# Patient Record
Sex: Female | Born: 1970 | Race: Black or African American | Hispanic: No | Smoking: Never smoker
Health system: Southern US, Community
[De-identification: ages and names within clinical notes are randomized; demographics above are authoritative.]

## PROBLEM LIST (undated history)

## (undated) DIAGNOSIS — K219 Gastro-esophageal reflux disease without esophagitis: Secondary | ICD-10-CM

## (undated) DIAGNOSIS — D649 Anemia, unspecified: Secondary | ICD-10-CM

## (undated) DIAGNOSIS — E785 Hyperlipidemia, unspecified: Secondary | ICD-10-CM

## (undated) DIAGNOSIS — R0602 Shortness of breath: Secondary | ICD-10-CM

## (undated) DIAGNOSIS — G4730 Sleep apnea, unspecified: Secondary | ICD-10-CM

## (undated) DIAGNOSIS — I10 Essential (primary) hypertension: Secondary | ICD-10-CM

## (undated) DIAGNOSIS — F419 Anxiety disorder, unspecified: Secondary | ICD-10-CM

## (undated) DIAGNOSIS — E669 Obesity, unspecified: Secondary | ICD-10-CM

## (undated) DIAGNOSIS — F329 Major depressive disorder, single episode, unspecified: Secondary | ICD-10-CM

## (undated) DIAGNOSIS — E538 Deficiency of other specified B group vitamins: Secondary | ICD-10-CM

## (undated) HISTORY — DX: Anemia, unspecified: D64.9

## (undated) HISTORY — DX: Obesity, unspecified: E66.9

## (undated) HISTORY — PX: COLONOSCOPY: SHX174

## (undated) HISTORY — DX: Major depressive disorder, single episode, unspecified: F32.9

## (undated) HISTORY — PX: TONSILLECTOMY AND ADENOIDECTOMY: SUR1326

## (undated) HISTORY — DX: Deficiency of other specified B group vitamins: E53.8

## (undated) HISTORY — PX: UPPER GI ENDOSCOPY: SHX6162

## (undated) HISTORY — DX: Essential (primary) hypertension: I10

## (undated) HISTORY — DX: Gastro-esophageal reflux disease without esophagitis: K21.9

## (undated) HISTORY — DX: Sleep apnea, unspecified: G47.30

## (undated) HISTORY — PX: WISDOM TOOTH EXTRACTION: SHX21

---

## 1998-04-13 ENCOUNTER — Other Ambulatory Visit: Admission: RE | Admit: 1998-04-13 | Discharge: 1998-04-13 | Payer: Self-pay | Admitting: Obstetrics and Gynecology

## 1999-05-06 ENCOUNTER — Other Ambulatory Visit: Admission: RE | Admit: 1999-05-06 | Discharge: 1999-05-06 | Payer: Self-pay | Admitting: Obstetrics and Gynecology

## 2000-06-14 ENCOUNTER — Other Ambulatory Visit: Admission: RE | Admit: 2000-06-14 | Discharge: 2000-06-14 | Payer: Self-pay | Admitting: Obstetrics and Gynecology

## 2000-08-01 HISTORY — PX: OTHER SURGICAL HISTORY: SHX169

## 2000-08-01 HISTORY — PX: MYOMECTOMY: SHX85

## 2000-10-23 ENCOUNTER — Ambulatory Visit (HOSPITAL_BASED_OUTPATIENT_CLINIC_OR_DEPARTMENT_OTHER): Admission: RE | Admit: 2000-10-23 | Discharge: 2000-10-23 | Payer: Self-pay | Admitting: Critical Care Medicine

## 2001-01-04 ENCOUNTER — Ambulatory Visit (HOSPITAL_BASED_OUTPATIENT_CLINIC_OR_DEPARTMENT_OTHER): Admission: RE | Admit: 2001-01-04 | Discharge: 2001-01-04 | Payer: Self-pay | Admitting: Critical Care Medicine

## 2001-03-01 ENCOUNTER — Other Ambulatory Visit: Admission: RE | Admit: 2001-03-01 | Discharge: 2001-03-01 | Payer: Self-pay | Admitting: Obstetrics and Gynecology

## 2001-03-05 ENCOUNTER — Inpatient Hospital Stay (HOSPITAL_COMMUNITY): Admission: AD | Admit: 2001-03-05 | Discharge: 2001-03-05 | Payer: Self-pay | Admitting: Obstetrics and Gynecology

## 2001-03-05 ENCOUNTER — Encounter: Payer: Self-pay | Admitting: Obstetrics and Gynecology

## 2001-03-07 ENCOUNTER — Inpatient Hospital Stay (HOSPITAL_COMMUNITY): Admission: AD | Admit: 2001-03-07 | Discharge: 2001-03-07 | Payer: Self-pay | Admitting: Obstetrics and Gynecology

## 2001-03-08 ENCOUNTER — Encounter (INDEPENDENT_AMBULATORY_CARE_PROVIDER_SITE_OTHER): Payer: Self-pay

## 2001-03-08 ENCOUNTER — Ambulatory Visit (HOSPITAL_COMMUNITY): Admission: AD | Admit: 2001-03-08 | Discharge: 2001-03-08 | Payer: Self-pay | Admitting: Obstetrics and Gynecology

## 2001-09-17 ENCOUNTER — Other Ambulatory Visit: Admission: RE | Admit: 2001-09-17 | Discharge: 2001-09-17 | Payer: Self-pay | Admitting: Obstetrics and Gynecology

## 2002-06-10 ENCOUNTER — Other Ambulatory Visit: Admission: RE | Admit: 2002-06-10 | Discharge: 2002-06-10 | Payer: Self-pay | Admitting: Obstetrics and Gynecology

## 2002-07-23 ENCOUNTER — Encounter: Admission: RE | Admit: 2002-07-23 | Discharge: 2002-10-21 | Payer: Self-pay | Admitting: Obstetrics and Gynecology

## 2002-08-01 DIAGNOSIS — K219 Gastro-esophageal reflux disease without esophagitis: Secondary | ICD-10-CM

## 2002-08-01 HISTORY — DX: Gastro-esophageal reflux disease without esophagitis: K21.9

## 2002-11-18 ENCOUNTER — Inpatient Hospital Stay (HOSPITAL_COMMUNITY): Admission: AD | Admit: 2002-11-18 | Discharge: 2002-11-18 | Payer: Self-pay | Admitting: Obstetrics and Gynecology

## 2002-11-18 ENCOUNTER — Encounter: Payer: Self-pay | Admitting: Obstetrics and Gynecology

## 2002-11-26 ENCOUNTER — Encounter (HOSPITAL_COMMUNITY): Admission: RE | Admit: 2002-11-26 | Discharge: 2002-12-26 | Payer: Self-pay | Admitting: Obstetrics and Gynecology

## 2002-12-23 ENCOUNTER — Inpatient Hospital Stay (HOSPITAL_COMMUNITY): Admission: AD | Admit: 2002-12-23 | Discharge: 2002-12-23 | Payer: Self-pay | Admitting: Obstetrics and Gynecology

## 2003-01-24 ENCOUNTER — Inpatient Hospital Stay (HOSPITAL_COMMUNITY): Admission: AD | Admit: 2003-01-24 | Discharge: 2003-01-28 | Payer: Self-pay | Admitting: Obstetrics and Gynecology

## 2003-01-24 ENCOUNTER — Encounter (INDEPENDENT_AMBULATORY_CARE_PROVIDER_SITE_OTHER): Payer: Self-pay | Admitting: *Deleted

## 2003-02-28 ENCOUNTER — Other Ambulatory Visit: Admission: RE | Admit: 2003-02-28 | Discharge: 2003-02-28 | Payer: Self-pay | Admitting: Obstetrics and Gynecology

## 2003-10-09 ENCOUNTER — Emergency Department (HOSPITAL_COMMUNITY): Admission: AD | Admit: 2003-10-09 | Discharge: 2003-10-09 | Payer: Self-pay | Admitting: Family Medicine

## 2004-03-02 ENCOUNTER — Other Ambulatory Visit: Admission: RE | Admit: 2004-03-02 | Discharge: 2004-03-02 | Payer: Self-pay | Admitting: Obstetrics and Gynecology

## 2004-04-06 ENCOUNTER — Ambulatory Visit (HOSPITAL_COMMUNITY): Admission: RE | Admit: 2004-04-06 | Discharge: 2004-04-06 | Payer: Self-pay | Admitting: Obstetrics and Gynecology

## 2004-06-09 ENCOUNTER — Ambulatory Visit: Payer: Self-pay | Admitting: Internal Medicine

## 2004-06-21 ENCOUNTER — Ambulatory Visit: Payer: Self-pay | Admitting: Internal Medicine

## 2004-07-12 ENCOUNTER — Ambulatory Visit: Payer: Self-pay | Admitting: Internal Medicine

## 2004-09-13 ENCOUNTER — Ambulatory Visit: Payer: Self-pay | Admitting: Internal Medicine

## 2004-12-03 ENCOUNTER — Ambulatory Visit: Payer: Self-pay | Admitting: Internal Medicine

## 2004-12-20 ENCOUNTER — Emergency Department (HOSPITAL_COMMUNITY): Admission: EM | Admit: 2004-12-20 | Discharge: 2004-12-20 | Payer: Self-pay | Admitting: Emergency Medicine

## 2004-12-31 ENCOUNTER — Ambulatory Visit: Payer: Self-pay | Admitting: Internal Medicine

## 2005-03-03 ENCOUNTER — Other Ambulatory Visit: Admission: RE | Admit: 2005-03-03 | Discharge: 2005-03-03 | Payer: Self-pay | Admitting: Obstetrics and Gynecology

## 2005-05-26 ENCOUNTER — Ambulatory Visit: Payer: Self-pay | Admitting: Internal Medicine

## 2005-10-07 ENCOUNTER — Emergency Department (HOSPITAL_COMMUNITY): Admission: EM | Admit: 2005-10-07 | Discharge: 2005-10-07 | Payer: Self-pay | Admitting: Family Medicine

## 2005-10-09 ENCOUNTER — Emergency Department (HOSPITAL_COMMUNITY): Admission: EM | Admit: 2005-10-09 | Discharge: 2005-10-09 | Payer: Self-pay | Admitting: Family Medicine

## 2005-10-11 ENCOUNTER — Emergency Department (HOSPITAL_COMMUNITY): Admission: EM | Admit: 2005-10-11 | Discharge: 2005-10-11 | Payer: Self-pay | Admitting: Family Medicine

## 2005-10-13 ENCOUNTER — Ambulatory Visit: Payer: Self-pay | Admitting: Internal Medicine

## 2005-10-17 ENCOUNTER — Ambulatory Visit: Payer: Self-pay | Admitting: Internal Medicine

## 2005-11-02 ENCOUNTER — Ambulatory Visit: Payer: Self-pay | Admitting: Internal Medicine

## 2005-11-08 ENCOUNTER — Ambulatory Visit: Payer: Self-pay | Admitting: Internal Medicine

## 2005-11-16 ENCOUNTER — Encounter: Admission: RE | Admit: 2005-11-16 | Discharge: 2005-11-16 | Payer: Self-pay | Admitting: Internal Medicine

## 2005-11-17 ENCOUNTER — Ambulatory Visit: Payer: Self-pay | Admitting: Endocrinology

## 2005-12-08 ENCOUNTER — Ambulatory Visit: Payer: Self-pay | Admitting: Internal Medicine

## 2006-03-23 ENCOUNTER — Other Ambulatory Visit: Admission: RE | Admit: 2006-03-23 | Discharge: 2006-03-23 | Payer: Self-pay | Admitting: Obstetrics and Gynecology

## 2006-05-06 ENCOUNTER — Emergency Department (HOSPITAL_COMMUNITY): Admission: EM | Admit: 2006-05-06 | Discharge: 2006-05-06 | Payer: Self-pay | Admitting: Family Medicine

## 2006-05-10 ENCOUNTER — Ambulatory Visit: Payer: Self-pay | Admitting: Internal Medicine

## 2006-08-01 DIAGNOSIS — I10 Essential (primary) hypertension: Secondary | ICD-10-CM

## 2006-08-01 HISTORY — DX: Essential (primary) hypertension: I10

## 2006-08-10 ENCOUNTER — Emergency Department (HOSPITAL_COMMUNITY): Admission: EM | Admit: 2006-08-10 | Discharge: 2006-08-10 | Payer: Self-pay | Admitting: Family Medicine

## 2006-10-17 ENCOUNTER — Ambulatory Visit: Payer: Self-pay | Admitting: Internal Medicine

## 2006-10-30 ENCOUNTER — Encounter: Admission: RE | Admit: 2006-10-30 | Discharge: 2006-10-30 | Payer: Self-pay | Admitting: Internal Medicine

## 2006-11-08 ENCOUNTER — Ambulatory Visit: Payer: Self-pay | Admitting: Gastroenterology

## 2006-11-27 ENCOUNTER — Ambulatory Visit (HOSPITAL_COMMUNITY): Admission: RE | Admit: 2006-11-27 | Discharge: 2006-11-27 | Payer: Self-pay | Admitting: Gastroenterology

## 2006-11-30 ENCOUNTER — Ambulatory Visit: Payer: Self-pay | Admitting: Gastroenterology

## 2006-12-05 ENCOUNTER — Ambulatory Visit: Payer: Self-pay | Admitting: Pulmonary Disease

## 2006-12-12 ENCOUNTER — Ambulatory Visit: Payer: Self-pay | Admitting: Endocrinology

## 2006-12-19 ENCOUNTER — Encounter (INDEPENDENT_AMBULATORY_CARE_PROVIDER_SITE_OTHER): Payer: Self-pay | Admitting: Obstetrics and Gynecology

## 2006-12-19 ENCOUNTER — Ambulatory Visit (HOSPITAL_COMMUNITY): Admission: RE | Admit: 2006-12-19 | Discharge: 2006-12-19 | Payer: Self-pay | Admitting: Obstetrics and Gynecology

## 2006-12-27 ENCOUNTER — Ambulatory Visit: Payer: Self-pay | Admitting: Gastroenterology

## 2006-12-29 ENCOUNTER — Ambulatory Visit (HOSPITAL_COMMUNITY): Admission: RE | Admit: 2006-12-29 | Discharge: 2006-12-29 | Payer: Self-pay | Admitting: Gastroenterology

## 2007-01-22 ENCOUNTER — Ambulatory Visit: Payer: Self-pay | Admitting: Internal Medicine

## 2007-01-22 LAB — CONVERTED CEMR LAB: Streptococcus, Group A Screen (Direct): NEGATIVE

## 2007-03-26 ENCOUNTER — Other Ambulatory Visit: Admission: RE | Admit: 2007-03-26 | Discharge: 2007-03-26 | Payer: Self-pay | Admitting: Obstetrics and Gynecology

## 2007-03-29 ENCOUNTER — Ambulatory Visit: Payer: Self-pay | Admitting: Internal Medicine

## 2007-03-29 LAB — CONVERTED CEMR LAB
AST: 16 units/L (ref 0–37)
Albumin: 3.3 g/dL — ABNORMAL LOW (ref 3.5–5.2)
Alkaline Phosphatase: 82 units/L (ref 39–117)
BUN: 10 mg/dL (ref 6–23)
Bilirubin, Direct: 0.1 mg/dL (ref 0.0–0.3)
CO2: 27 meq/L (ref 19–32)
Eosinophils Absolute: 0.2 10*3/uL (ref 0.0–0.6)
GFR calc Af Amer: 105 mL/min
GFR calc non Af Amer: 87 mL/min
Glucose, Bld: 120 mg/dL — ABNORMAL HIGH (ref 70–99)
HCT: 30.9 % — ABNORMAL LOW (ref 36.0–46.0)
Hemoglobin: 10.3 g/dL — ABNORMAL LOW (ref 12.0–15.0)
LDL Cholesterol: 108 mg/dL — ABNORMAL HIGH (ref 0–99)
MCHC: 33.5 g/dL (ref 30.0–36.0)
Monocytes Absolute: 0.6 10*3/uL (ref 0.2–0.7)
RBC: 4.17 M/uL (ref 3.87–5.11)
Sodium: 142 meq/L (ref 135–145)
Specific Gravity, Urine: 1.025 (ref 1.000–1.03)
Total Bilirubin: 0.4 mg/dL (ref 0.3–1.2)
Total CHOL/HDL Ratio: 4.5
VLDL: 32 mg/dL (ref 0–40)
WBC: 7.8 10*3/uL (ref 4.5–10.5)
pH: 6 (ref 5.0–8.0)

## 2007-04-05 ENCOUNTER — Encounter: Admission: RE | Admit: 2007-04-05 | Discharge: 2007-04-05 | Payer: Self-pay | Admitting: Internal Medicine

## 2007-05-28 ENCOUNTER — Encounter: Payer: Self-pay | Admitting: Internal Medicine

## 2007-05-28 DIAGNOSIS — D509 Iron deficiency anemia, unspecified: Secondary | ICD-10-CM

## 2007-08-30 ENCOUNTER — Ambulatory Visit: Payer: Self-pay | Admitting: Internal Medicine

## 2007-08-30 DIAGNOSIS — R209 Unspecified disturbances of skin sensation: Secondary | ICD-10-CM | POA: Insufficient documentation

## 2007-08-30 DIAGNOSIS — E559 Vitamin D deficiency, unspecified: Secondary | ICD-10-CM | POA: Insufficient documentation

## 2007-08-30 DIAGNOSIS — R635 Abnormal weight gain: Secondary | ICD-10-CM

## 2007-08-30 DIAGNOSIS — L659 Nonscarring hair loss, unspecified: Secondary | ICD-10-CM | POA: Insufficient documentation

## 2007-08-30 DIAGNOSIS — E538 Deficiency of other specified B group vitamins: Secondary | ICD-10-CM | POA: Insufficient documentation

## 2007-09-05 ENCOUNTER — Emergency Department (HOSPITAL_COMMUNITY): Admission: EM | Admit: 2007-09-05 | Discharge: 2007-09-05 | Payer: Self-pay | Admitting: Emergency Medicine

## 2007-09-10 LAB — CONVERTED CEMR LAB
AST: 13 units/L (ref 0–37)
Alkaline Phosphatase: 84 units/L (ref 39–117)
BUN: 10 mg/dL (ref 6–23)
Basophils Absolute: 0 10*3/uL (ref 0.0–0.1)
Bilirubin, Direct: 0.1 mg/dL (ref 0.0–0.3)
Calcium: 9 mg/dL (ref 8.4–10.5)
Chloride: 105 meq/L (ref 96–112)
GFR calc Af Amer: 122 mL/min
GFR calc non Af Amer: 101 mL/min
Glucose, Bld: 110 mg/dL — ABNORMAL HIGH (ref 70–99)
Hemoglobin, Urine: NEGATIVE
Hemoglobin: 9.7 g/dL — ABNORMAL LOW (ref 12.0–15.0)
Hgb A1c MFr Bld: 7.3 % — ABNORMAL HIGH (ref 4.6–6.0)
Iron: 35 ug/dL — ABNORMAL LOW (ref 42–145)
MCHC: 32 g/dL (ref 30.0–36.0)
MCV: 73.3 fL — ABNORMAL LOW (ref 78.0–100.0)
Monocytes Absolute: 0.6 10*3/uL (ref 0.2–0.7)
Neutrophils Relative %: 63.7 % (ref 43.0–77.0)
Potassium: 4 meq/L (ref 3.5–5.1)
RBC: 4.13 M/uL (ref 3.87–5.11)
RDW: 16.2 % — ABNORMAL HIGH (ref 11.5–14.6)
Sodium: 138 meq/L (ref 135–145)
TSH: 1.72 microintl units/mL (ref 0.35–5.50)
Total Bilirubin: 0.6 mg/dL (ref 0.3–1.2)
Total Protein, Urine: NEGATIVE mg/dL
Total Protein: 6.9 g/dL (ref 6.0–8.3)
Urine Glucose: NEGATIVE mg/dL
pH: 6.5 (ref 5.0–8.0)

## 2007-09-18 ENCOUNTER — Telehealth: Payer: Self-pay | Admitting: Internal Medicine

## 2007-11-01 ENCOUNTER — Encounter: Admission: RE | Admit: 2007-11-01 | Discharge: 2007-11-01 | Payer: Self-pay | Admitting: Orthopedic Surgery

## 2007-11-27 ENCOUNTER — Encounter: Payer: Self-pay | Admitting: Gastroenterology

## 2008-01-09 ENCOUNTER — Telehealth: Payer: Self-pay | Admitting: Internal Medicine

## 2008-02-15 ENCOUNTER — Emergency Department (HOSPITAL_COMMUNITY): Admission: EM | Admit: 2008-02-15 | Discharge: 2008-02-15 | Payer: Self-pay | Admitting: Emergency Medicine

## 2008-05-23 ENCOUNTER — Emergency Department (HOSPITAL_COMMUNITY): Admission: EM | Admit: 2008-05-23 | Discharge: 2008-05-23 | Payer: Self-pay | Admitting: Family Medicine

## 2008-09-29 ENCOUNTER — Ambulatory Visit: Payer: Self-pay | Admitting: Internal Medicine

## 2008-09-30 LAB — CONVERTED CEMR LAB
ALT: 21 units/L (ref 0–35)
Albumin: 3.1 g/dL — ABNORMAL LOW (ref 3.5–5.2)
Alkaline Phosphatase: 100 units/L (ref 39–117)
Basophils Relative: 0.6 % (ref 0.0–3.0)
Bilirubin Urine: NEGATIVE
Bilirubin, Direct: 0.1 mg/dL (ref 0.0–0.3)
CO2: 28 meq/L (ref 19–32)
Calcium: 9.4 mg/dL (ref 8.4–10.5)
Cholesterol: 185 mg/dL (ref 0–200)
Eosinophils Absolute: 0.1 10*3/uL (ref 0.0–0.7)
GFR calc non Af Amer: 120 mL/min
Ketones, ur: NEGATIVE mg/dL
Lymphocytes Relative: 28.3 % (ref 12.0–46.0)
MCHC: 32.6 g/dL (ref 30.0–36.0)
MCV: 78.6 fL (ref 78.0–100.0)
Monocytes Absolute: 0.6 10*3/uL (ref 0.1–1.0)
Monocytes Relative: 8.4 % (ref 3.0–12.0)
Neutro Abs: 4.2 10*3/uL (ref 1.4–7.7)
Platelets: 332 10*3/uL (ref 150–400)
RDW: 15.8 % — ABNORMAL HIGH (ref 11.5–14.6)
Sodium: 137 meq/L (ref 135–145)
Total Protein, Urine: NEGATIVE mg/dL
Triglycerides: 215 mg/dL (ref 0–149)
Urobilinogen, UA: 0.2 (ref 0.0–1.0)
pH: 6.5 (ref 5.0–8.0)

## 2008-10-02 ENCOUNTER — Ambulatory Visit: Payer: Self-pay | Admitting: Internal Medicine

## 2008-10-02 DIAGNOSIS — N3090 Cystitis, unspecified without hematuria: Secondary | ICD-10-CM | POA: Insufficient documentation

## 2008-10-02 DIAGNOSIS — R030 Elevated blood-pressure reading, without diagnosis of hypertension: Secondary | ICD-10-CM | POA: Insufficient documentation

## 2008-10-30 ENCOUNTER — Encounter: Payer: Self-pay | Admitting: Internal Medicine

## 2008-11-07 ENCOUNTER — Encounter: Payer: Self-pay | Admitting: Internal Medicine

## 2008-12-27 ENCOUNTER — Emergency Department (HOSPITAL_COMMUNITY): Admission: EM | Admit: 2008-12-27 | Discharge: 2008-12-27 | Payer: Self-pay | Admitting: Family Medicine

## 2009-01-05 ENCOUNTER — Telehealth: Payer: Self-pay | Admitting: Internal Medicine

## 2009-03-18 ENCOUNTER — Ambulatory Visit (HOSPITAL_COMMUNITY): Admission: RE | Admit: 2009-03-18 | Discharge: 2009-03-18 | Payer: Self-pay | Admitting: Orthopedic Surgery

## 2009-04-07 ENCOUNTER — Ambulatory Visit: Payer: Self-pay | Admitting: Internal Medicine

## 2009-04-07 LAB — CONVERTED CEMR LAB
BUN: 10 mg/dL (ref 6–23)
Chloride: 101 meq/L (ref 96–112)
Creatinine, Ser: 0.8 mg/dL (ref 0.4–1.2)
Eosinophils Absolute: 0.2 10*3/uL (ref 0.0–0.7)
GFR calc non Af Amer: 103.24 mL/min (ref >60)
Glucose, Bld: 96 mg/dL (ref 70–99)
Hemoglobin: 11 g/dL — ABNORMAL LOW (ref 12.0–15.0)
Hgb A1c MFr Bld: 8.1 % — ABNORMAL HIGH (ref 4.6–6.5)
Iron: 54 ug/dL (ref 42–145)
Lymphocytes Relative: 31.9 % (ref 12.0–46.0)
Lymphs Abs: 2.8 10*3/uL (ref 0.7–4.0)
MCV: 79.1 fL (ref 78.0–100.0)
Monocytes Absolute: 0.6 10*3/uL (ref 0.1–1.0)
Monocytes Relative: 7 % (ref 3.0–12.0)
Neutrophils Relative %: 58.9 % (ref 43.0–77.0)
Platelets: 311 10*3/uL (ref 150.0–400.0)
RBC: 4.18 M/uL (ref 3.87–5.11)
RDW: 14.5 % (ref 11.5–14.6)
Sodium: 138 meq/L (ref 135–145)

## 2009-04-08 ENCOUNTER — Encounter: Payer: Self-pay | Admitting: Internal Medicine

## 2009-04-08 LAB — CONVERTED CEMR LAB: Vit D, 25-Hydroxy: 28 ng/mL — ABNORMAL LOW (ref 30–89)

## 2009-04-14 ENCOUNTER — Ambulatory Visit: Payer: Self-pay | Admitting: Internal Medicine

## 2009-04-14 DIAGNOSIS — E1165 Type 2 diabetes mellitus with hyperglycemia: Secondary | ICD-10-CM

## 2009-05-05 ENCOUNTER — Encounter: Admission: RE | Admit: 2009-05-05 | Discharge: 2009-07-29 | Payer: Self-pay | Admitting: Internal Medicine

## 2009-05-05 ENCOUNTER — Encounter: Payer: Self-pay | Admitting: Internal Medicine

## 2009-06-28 ENCOUNTER — Encounter: Admission: RE | Admit: 2009-06-28 | Discharge: 2009-06-28 | Payer: Self-pay | Admitting: Orthopedic Surgery

## 2009-07-03 ENCOUNTER — Emergency Department (HOSPITAL_COMMUNITY): Admission: EM | Admit: 2009-07-03 | Discharge: 2009-07-03 | Payer: Self-pay | Admitting: Emergency Medicine

## 2009-07-13 ENCOUNTER — Ambulatory Visit: Payer: Self-pay | Admitting: Internal Medicine

## 2009-07-14 LAB — CONVERTED CEMR LAB
Chloride: 102 meq/L (ref 96–112)
Creatinine, Ser: 0.7 mg/dL (ref 0.4–1.2)
Glucose, Bld: 117 mg/dL — ABNORMAL HIGH (ref 70–99)

## 2009-07-17 ENCOUNTER — Ambulatory Visit: Payer: Self-pay | Admitting: Internal Medicine

## 2009-07-17 DIAGNOSIS — E669 Obesity, unspecified: Secondary | ICD-10-CM

## 2009-07-21 ENCOUNTER — Encounter: Admission: RE | Admit: 2009-07-21 | Discharge: 2009-07-31 | Payer: Self-pay | Admitting: Orthopedic Surgery

## 2009-08-01 DIAGNOSIS — F32A Depression, unspecified: Secondary | ICD-10-CM

## 2009-08-01 HISTORY — DX: Depression, unspecified: F32.A

## 2009-08-03 ENCOUNTER — Encounter: Admission: RE | Admit: 2009-08-03 | Discharge: 2009-10-05 | Payer: Self-pay | Admitting: Orthopedic Surgery

## 2009-09-07 ENCOUNTER — Ambulatory Visit: Payer: Self-pay | Admitting: Internal Medicine

## 2009-09-07 DIAGNOSIS — F329 Major depressive disorder, single episode, unspecified: Secondary | ICD-10-CM

## 2009-09-29 ENCOUNTER — Ambulatory Visit: Payer: Self-pay | Admitting: Internal Medicine

## 2009-09-29 LAB — CONVERTED CEMR LAB
Albumin: 3.2 g/dL — ABNORMAL LOW (ref 3.5–5.2)
Chloride: 104 meq/L (ref 96–112)
Creatinine, Ser: 0.6 mg/dL (ref 0.4–1.2)
GFR calc non Af Amer: 143.52 mL/min (ref >60)
Sodium: 142 meq/L (ref 135–145)
Vitamin B-12: 849 pg/mL (ref 211–911)

## 2009-09-30 ENCOUNTER — Encounter: Admission: RE | Admit: 2009-09-30 | Discharge: 2009-12-01 | Payer: Self-pay | Admitting: Internal Medicine

## 2009-10-05 ENCOUNTER — Ambulatory Visit: Payer: Self-pay | Admitting: Internal Medicine

## 2009-10-05 DIAGNOSIS — R079 Chest pain, unspecified: Secondary | ICD-10-CM

## 2009-10-05 DIAGNOSIS — L83 Acanthosis nigricans: Secondary | ICD-10-CM

## 2009-11-04 ENCOUNTER — Telehealth: Payer: Self-pay | Admitting: Internal Medicine

## 2009-12-17 ENCOUNTER — Telehealth: Payer: Self-pay | Admitting: Internal Medicine

## 2010-01-18 ENCOUNTER — Ambulatory Visit: Payer: Self-pay | Admitting: Internal Medicine

## 2010-01-18 DIAGNOSIS — R110 Nausea: Secondary | ICD-10-CM

## 2010-01-18 DIAGNOSIS — R1012 Left upper quadrant pain: Secondary | ICD-10-CM

## 2010-01-19 ENCOUNTER — Telehealth: Payer: Self-pay | Admitting: Internal Medicine

## 2010-01-19 LAB — CONVERTED CEMR LAB
Alkaline Phosphatase: 91 units/L (ref 39–117)
BUN: 11 mg/dL (ref 6–23)
Basophils Relative: 0.3 % (ref 0.0–3.0)
Bilirubin, Direct: 0.1 mg/dL (ref 0.0–0.3)
CO2: 26 meq/L (ref 19–32)
Chloride: 103 meq/L (ref 96–112)
Eosinophils Absolute: 0.1 10*3/uL (ref 0.0–0.7)
Eosinophils Relative: 1.4 % (ref 0.0–5.0)
GFR calc non Af Amer: 135.45 mL/min (ref >60)
H Pylori IgG: NEGATIVE
HCT: 32 % — ABNORMAL LOW (ref 36.0–46.0)
Ketones, ur: NEGATIVE mg/dL
Lipase: 20 units/L (ref 11.0–59.0)
Lymphocytes Relative: 25.5 % (ref 12.0–46.0)
Lymphs Abs: 2.2 10*3/uL (ref 0.7–4.0)
MCHC: 32.3 g/dL (ref 30.0–36.0)
MCV: 77.8 fL — ABNORMAL LOW (ref 78.0–100.0)
Monocytes Absolute: 0.5 10*3/uL (ref 0.1–1.0)
Monocytes Relative: 6.1 % (ref 3.0–12.0)
Neutro Abs: 5.7 10*3/uL (ref 1.4–7.7)
Neutrophils Relative %: 66.7 % (ref 43.0–77.0)
RBC: 4.11 M/uL (ref 3.87–5.11)
RDW: 17 % — ABNORMAL HIGH (ref 11.5–14.6)
Sodium: 140 meq/L (ref 135–145)
Urobilinogen, UA: 0.2 (ref 0.0–1.0)
WBC: 8.6 10*3/uL (ref 4.5–10.5)
pH: 6 (ref 5.0–8.0)

## 2010-01-26 ENCOUNTER — Ambulatory Visit (HOSPITAL_COMMUNITY): Admission: RE | Admit: 2010-01-26 | Discharge: 2010-01-26 | Payer: Self-pay | Admitting: Internal Medicine

## 2010-02-09 ENCOUNTER — Encounter: Admission: RE | Admit: 2010-02-09 | Discharge: 2010-04-07 | Payer: Self-pay | Admitting: Internal Medicine

## 2010-02-23 ENCOUNTER — Ambulatory Visit: Payer: Self-pay | Admitting: Internal Medicine

## 2010-02-23 DIAGNOSIS — H6990 Unspecified Eustachian tube disorder, unspecified ear: Secondary | ICD-10-CM | POA: Insufficient documentation

## 2010-02-23 DIAGNOSIS — R42 Dizziness and giddiness: Secondary | ICD-10-CM

## 2010-02-23 DIAGNOSIS — H6690 Otitis media, unspecified, unspecified ear: Secondary | ICD-10-CM | POA: Insufficient documentation

## 2010-04-30 ENCOUNTER — Ambulatory Visit: Payer: Self-pay | Admitting: Internal Medicine

## 2010-05-01 LAB — CONVERTED CEMR LAB
HCT: 29.6 % — ABNORMAL LOW (ref 36.0–46.0)
Lymphocytes Relative: 29.5 % (ref 12.0–46.0)
Lymphs Abs: 2.5 10*3/uL (ref 0.7–4.0)
MCHC: 33.2 g/dL (ref 30.0–36.0)
MCV: 78 fL (ref 78.0–100.0)
Monocytes Absolute: 0.6 10*3/uL (ref 0.1–1.0)
Monocytes Relative: 6.5 % (ref 3.0–12.0)
Neutro Abs: 5.4 10*3/uL (ref 1.4–7.7)
Sed Rate: 54 mm/hr — ABNORMAL HIGH (ref 0–22)

## 2010-05-04 ENCOUNTER — Ambulatory Visit: Payer: Self-pay | Admitting: Internal Medicine

## 2010-05-04 DIAGNOSIS — R1013 Epigastric pain: Secondary | ICD-10-CM

## 2010-05-11 ENCOUNTER — Ambulatory Visit (HOSPITAL_COMMUNITY): Admission: RE | Admit: 2010-05-11 | Discharge: 2010-05-11 | Payer: Self-pay | Admitting: Internal Medicine

## 2010-05-13 ENCOUNTER — Telehealth: Payer: Self-pay | Admitting: Internal Medicine

## 2010-05-26 ENCOUNTER — Telehealth: Payer: Self-pay | Admitting: Internal Medicine

## 2010-06-29 ENCOUNTER — Encounter
Admission: RE | Admit: 2010-06-29 | Discharge: 2010-08-31 | Payer: Self-pay | Source: Home / Self Care | Attending: Internal Medicine | Admitting: Internal Medicine

## 2010-07-24 ENCOUNTER — Emergency Department (HOSPITAL_COMMUNITY)
Admission: EM | Admit: 2010-07-24 | Discharge: 2010-07-24 | Payer: Self-pay | Source: Home / Self Care | Admitting: Emergency Medicine

## 2010-08-13 ENCOUNTER — Ambulatory Visit
Admission: RE | Admit: 2010-08-13 | Discharge: 2010-08-13 | Payer: Self-pay | Source: Home / Self Care | Attending: Internal Medicine | Admitting: Internal Medicine

## 2010-08-13 ENCOUNTER — Other Ambulatory Visit: Payer: Self-pay | Admitting: Internal Medicine

## 2010-08-13 LAB — CBC WITH DIFFERENTIAL/PLATELET
Basophils Absolute: 0 10*3/uL (ref 0.0–0.1)
Basophils Relative: 0.4 % (ref 0.0–3.0)
Eosinophils Absolute: 0.1 10*3/uL (ref 0.0–0.7)
Eosinophils Relative: 1.5 % (ref 0.0–5.0)
HCT: 32.4 % — ABNORMAL LOW (ref 36.0–46.0)
Hemoglobin: 10.5 g/dL — ABNORMAL LOW (ref 12.0–15.0)
Lymphocytes Relative: 28.3 % (ref 12.0–46.0)
Lymphs Abs: 2.1 10*3/uL (ref 0.7–4.0)
MCHC: 32.4 g/dL (ref 30.0–36.0)
MCV: 77 fl — ABNORMAL LOW (ref 78.0–100.0)
Monocytes Absolute: 0.5 10*3/uL (ref 0.1–1.0)
Monocytes Relative: 6.1 % (ref 3.0–12.0)
Neutro Abs: 4.7 10*3/uL (ref 1.4–7.7)
Neutrophils Relative %: 63.7 % (ref 43.0–77.0)
Platelets: 375 10*3/uL (ref 150.0–400.0)
RBC: 4.21 Mil/uL (ref 3.87–5.11)
RDW: 17.1 % — ABNORMAL HIGH (ref 11.5–14.6)
WBC: 7.4 10*3/uL (ref 4.5–10.5)

## 2010-08-13 LAB — BASIC METABOLIC PANEL
BUN: 11 mg/dL (ref 6–23)
CO2: 28 mEq/L (ref 19–32)
Calcium: 9.3 mg/dL (ref 8.4–10.5)
Chloride: 100 mEq/L (ref 96–112)
Creatinine, Ser: 0.5 mg/dL (ref 0.4–1.2)
GFR: 164.86 mL/min (ref >60.00)
Glucose, Bld: 108 mg/dL — ABNORMAL HIGH (ref 70–99)
Potassium: 4.5 mEq/L (ref 3.5–5.1)
Sodium: 136 mEq/L (ref 135–145)

## 2010-08-13 LAB — HEMOGLOBIN A1C: Hgb A1c MFr Bld: 7.3 % — ABNORMAL HIGH (ref 4.6–6.5)

## 2010-08-13 LAB — SEDIMENTATION RATE: Sed Rate: 65 mm/hr — ABNORMAL HIGH (ref 0–22)

## 2010-08-20 ENCOUNTER — Ambulatory Visit
Admission: RE | Admit: 2010-08-20 | Discharge: 2010-08-20 | Payer: Self-pay | Source: Home / Self Care | Attending: Internal Medicine | Admitting: Internal Medicine

## 2010-08-22 ENCOUNTER — Encounter: Payer: Self-pay | Admitting: Orthopedic Surgery

## 2010-08-31 ENCOUNTER — Ambulatory Visit (HOSPITAL_COMMUNITY)
Admission: RE | Admit: 2010-08-31 | Discharge: 2010-08-31 | Payer: Self-pay | Source: Home / Self Care | Attending: Internal Medicine | Admitting: Internal Medicine

## 2010-08-31 NOTE — Assessment & Plan Note (Signed)
Summary: f/u appt per pt/c#/cd    RS'D PER PT/NWS   Vital Signs:  Patient profile:   40 year old female Height:      66 inches Weight:      278 pounds BMI:     45.03 Temp:     98.7 degrees F oral Pulse rate:   86 / minute Pulse rhythm:   regular Resp:     16 per minute BP sitting:   118 / 84  (left arm) Cuff size:   large  Vitals Entered By: Lanier Prude, CMA(AAMA) (May 04, 2010 8:14 AM) CC: f/u c/o epigastric pain radiates into back X 4 days Is Patient Diabetic? Yes Pain Assessment Patient in pain? yes      Comments pt is not taking Vit d, vit b12, Align, Preomethazine or Meclizine. Please remove from list.   CC:  f/u c/o epigastric pain radiates into back X 4 days.  History of Present Illness: C/o epig chest pain going in the back since she ate something on Fri 3/10 now; she was belching and had gas (after ate a bisquit w/ham, cheese and egg). Pain was 7/10. There was nausea. The patient presents for a follow up of hypertension, diabetes, hyperlipidemia, anemia C/o odor underarms  Current Medications (verified): 1)  Necon 0.5/35 (28) 0.5-35 Mg-Mcg  Tabs (Norethindrone-Eth Estradiol) .... Bcp 2)  Ranitidine Hcl 300 Mg  Tabs (Ranitidine Hcl) .... At Bedtime 3)  Spironolactone 100 Mg Tabs (Spironolactone) .... Once Daily 4)  Vitamin D3 1000 Unit  Tabs (Cholecalciferol) .... 2 Po Once Daily 5)  Vitamin B-12 1000 Mcg  Tabs (Cyanocobalamin) .Marland Kitchen.. 1 Qd 6)  Ferosul 325 (Otc) .... Once Daily 7)  Valtrex 1 Gm Tabs (Valacyclovir Hcl) .... Once Daily 8)  Ibuprofen 800 Mg Tabs (Ibuprofen) .... As Needed 9)  Freestyle Lite Test  Strp (Glucose Blood) .... Use Qd 10)  Freestyle Lancets  Misc (Lancets) .... Use Qd 11)  Align  Caps (Probiotic Product) .Marland Kitchen.. 1 By Mouth Once Daily For Your Intesinal Flora Restoraion 12)  Metformin Hcl 1000 Mg Tabs (Metformin Hcl) .Marland Kitchen.. 1 By Mouth Bid 13)  Nexium 40 Mg Cpdr (Esomeprazole Magnesium) .Marland Kitchen.. 1 By Mouth Once Daily For Acid Reflux 14)   Promethazine Hcl 25 Mg Tabs (Promethazine Hcl) .Marland Kitchen.. 1-2 By Mouth Four Times A Day As Needed Nausea 15)  Meclizine Hcl 12.5 Mg Tabs (Meclizine Hcl) .Marland Kitchen.. 1po Q 6 Hrs As Needed Dizziness  Allergies (verified): 1)  ! Codeine Sulfate (Codeine Sulfate) 2)  ! Percocet (Oxycodone-Acetaminophen) 3)  ! Maxzide-25 Associate Professor)  Past History:  Past Medical History: Last updated: 09/07/2009 Anemia-iron deficiency elevaed glucose OBESITY BMI =44 OSA on cpap Vit B12 low Vit D def Asthma Hypertension Sleep Apnea with CPAP GYN Dr Freda Jackson Diabetes mellitus, type II 2010 Depression 2011  Past Surgical History: Last updated: 01/16/2008 Fibroidectomy  2002 Tonsillectomy C-section Myomectomy  Family History: Family History Hypertension S with galstones  Social History: Occupation: 1 st shift Married Never Smoked  Review of Systems       The patient complains of abdominal pain.  The patient denies fever, chest pain, melena, and hematochezia.    Physical Exam  General:  alert and overweight-appearing Eyes:  vision grossly intact, pupils equal, and pupils round.   Nose:  no external deformity and no nasal discharge.   Mouth:  no pharyngeal erythema and fair dentition.   Lungs:  normal respiratory effort and normal breath sounds.   Heart:  normal rate  and regular rhythm.   Abdomen:  S/NTsoft, non-tender, no masses, no guarding, no inguinal hernia, no hepatomegaly, and no splenomegaly.   Msk:  normal ROM.  LS NT Neurologic:  cranial nerves II-XII intact observationally, strength normal in all extremities, gait normal, DTRs symmetrical and normal, and finger-to-nose normal.   Skin:  Velvety hyperpim. in creases No rash Psych:  memory intact for recent and remote, normally interactive, and good eye contact.  not suicidal, depressed affect, and tearful.     Impression & Recommendations:  Problem # 1:  ABDOMINAL PAIN, EPIGASTRIC (ICD-789.06) Assessment New  GS vs food  poisoning START AUGMENTIN IF WORSE Orders: Radiology Referral (Radiology)  Problem # 2:  NAUSEA (ICD-787.02) Assessment: New  Her updated medication list for this problem includes:    Meclizine Hcl 12.5 Mg Tabs (Meclizine hcl) .Marland Kitchen... 1po q 6 hrs as needed dizziness  Orders: Radiology Referral (Radiology)  Problem # 3:  DIABETES MELLITUS, TYPE II (ICD-250.00) Assessment: Comment Only  Her updated medication list for this problem includes:    Metformin Hcl 1000 Mg Tabs (Metformin hcl) .Marland Kitchen... 1 by mouth bid  Problem # 4:  B12 DEFICIENCY (ICD-266.2) Assessment: Comment Only On the regimen of medicine(s) reflected in the chart    Problem # 5:  ANEMIA-IRON DEFICIENCY (ICD-280.9) Assessment: Unchanged The labs were reviewed with the patient.  F/u with Hematology Her updated medication list for this problem includes:    Vitamin B-12 1000 Mcg Tabs (Cyanocobalamin) .Marland Kitchen... 1 qd  Problem # 6:  Elev ESR Assessment: Improved The labs were reviewed with the patient.   Problem # 7:  C/o body odor Assessment: New Hibiclense Rx given  Complete Medication List: 1)  Necon 0.5/35 (28) 0.5-35 Mg-mcg Tabs (Norethindrone-eth estradiol) .... Bcp 2)  Ranitidine Hcl 300 Mg Tabs (Ranitidine hcl) .... At bedtime 3)  Spironolactone 100 Mg Tabs (Spironolactone) .... Once daily 4)  Vitamin D3 1000 Unit Tabs (Cholecalciferol) .... 2 po once daily 5)  Vitamin B-12 1000 Mcg Tabs (Cyanocobalamin) .Marland Kitchen.. 1 qd 6)  Ferosul 325 (otc)  .... Once daily 7)  Valtrex 1 Gm Tabs (Valacyclovir hcl) .... Once daily 8)  Ibuprofen 800 Mg Tabs (Ibuprofen) .... As needed 9)  Freestyle Lite Test Strp (Glucose blood) .... Use qd 10)  Freestyle Lancets Misc (Lancets) .... Use qd 11)  Align Caps (Probiotic product) .Marland Kitchen.. 1 by mouth once daily for your intesinal flora restoraion 12)  Metformin Hcl 1000 Mg Tabs (Metformin hcl) .Marland Kitchen.. 1 by mouth bid 13)  Nexium 40 Mg Cpdr (Esomeprazole magnesium) .Marland Kitchen.. 1 by mouth once daily for acid  reflux 14)  Promethazine Hcl 25 Mg Tabs (Promethazine hcl) .Marland Kitchen.. 1-2 by mouth four times a day as needed nausea 15)  Meclizine Hcl 12.5 Mg Tabs (Meclizine hcl) .Marland Kitchen.. 1po q 6 hrs as needed dizziness 16)  Augmentin 875-125 Mg Tabs (Amoxicillin-pot clavulanate) .Marland Kitchen.. 1 by mouth bid 17)  Hibiclens 4 % Liqd (Chlorhexidine gluconate) .... Use for shower 2/wk  Patient Instructions: 1)  Please schedule a follow-up appointment in 3 months. 2)  BMP prior to visit, ICD-9: 3)  ESR 4)  CBC w/ Diff prior to visit, ICD-9: 5)  HbgA1C prior to visit, ICD-9: 790.29 280.9 6)  Call if you are not better in a reasonable amount of time or if worse. Go to ER if feeling really bad!  Prescriptions: HIBICLENS 4 % LIQD (CHLORHEXIDINE GLUCONATE) Use for shower 2/wk  #300 ml x 2   Entered and Authorized by:   Macarthur Critchley  Buckner Malta MD   Signed by:   Tresa Garter MD on 05/04/2010   Method used:   Electronically to        [facility]* (retail)       [address]       [address] [place] [address]mailing       [place], [place]  [address]       Ph: [phone]       Fax: [phone]   RxID:   [id number] AUGMENTIN 875-125 MG TABS (AMOXICILLIN-POT CLAVULANATE) 1 by mouth bid  #20 x 0   Entered and Authorized by:   Tresa Garter MD   Signed by:   Tresa Garter MD on 05/04/2010   Method used:   Electronically to        [facility]* (retail)       [address]       [address] Shipping/mailing       [place], [place]  [address]       Ph: [phone]       Fax: [phone]   RxID:   [id number]

## 2010-08-31 NOTE — Progress Notes (Signed)
Summary: Antibiotic  Phone Note Call from Patient Call back at Home Phone [phone]   Summary of Call: Pt says she was supposed to get an rx for antibiotic? I do not see this? Please advise.  Initial call taken by: Lamar Sprinkles, CMA,  January 19, 2010 4:11 PM  Follow-up for Phone Call        There was no signs of infection on labs Cipro (done on 6/20) if fever - and call pls if sick How is she? Follow-up by: Tresa Garter MD,  January 19, 2010 5:55 PM  Additional Follow-up for Phone Call Additional follow up Details #1::        LMOVM for pt to return call ...Marland KitchenMarland KitchenAlvy Beal Archie CMA  January 20, 2010 10:04 AM     Additional Follow-up for Phone Call Additional follow up Details #2::    Spoke with patient and notified. Patient states that she is better. Follow-up by: Lucious Groves,  January 20, 2010 10:50 AM  Additional Follow-up for Phone Call Additional follow up Details #3:: Details for Additional Follow-up Action Taken: Good! Thx! Additional Follow-up by: Tresa Garter MD,  January 20, 2010 5:13 PM  Prescriptions: CIPROFLOXACIN HCL 500 MG TABS (CIPROFLOXACIN HCL) 1 by mouth bid  #20 x 0   Entered and Authorized by:   Tresa Garter MD   Signed by:   Tresa Garter MD on 01/19/2010   Method used:   Electronically to        [facility]* (retail)       [address]       [address] Shipping/mailing       [place], [place]  [address]       Ph: [phone]       Fax: [phone]   RxID:   [id number]

## 2010-08-31 NOTE — Progress Notes (Signed)
Summary: refill  Phone Note Call from Patient   Caller: Patient Summary of Call: Pt called because she had checked with pharmacy about Spironolactone not being refilled yet. Pharmacy sent fax last week and want know when would send refill. request.  Initial call taken by: Alysia Penna,  May 13, 2010 5:20 PM  Follow-up for Phone Call        called pt and informed her that the prescription was sent to the pharmacy at 3:25pm today. Follow-up by: Alysia Penna,  May 13, 2010 5:23 PM

## 2010-08-31 NOTE — Assessment & Plan Note (Signed)
Summary: ABD PAIN/NWS   Vital Signs:  Patient profile:   40 year old female Height:      66 inches (167.64 cm) Weight:      284.38 pounds (129.26 kg) BMI:     46.07 O2 Sat:      96 % on Room air Temp:     97.8 degrees F (36.56 degrees C) oral Pulse rate:   73 / minute BP sitting:   130 / 88  (left arm) Cuff size:   large  Vitals Entered By: Lucious Groves (January 18, 2010 11:19 AM)  O2 Flow:  Room air CC: C/O RUQ abd pain since last night./kb Pain Assessment Patient in pain? yes     Location: abdomen Intensity: 8 Type: sharp Onset of pain  last night Comments Patient notes that she is not taking Align./kb   CC:  C/O RUQ abd pain since last night./kb.  History of Present Illness: C/o R abd pain x 1 d woke up fom sleep and been going off and on since - severe at times. She had a sharp shooting pain in LUQ last wk. Denies any possible of pregnancy. No fever.  Current Medications (verified): 1)  Necon 0.5/35 (28) 0.5-35 Mg-Mcg  Tabs (Norethindrone-Eth Estradiol) .... Bcp 2)  Ranitidine Hcl 300 Mg  Tabs (Ranitidine Hcl) .... At Bedtime 3)  Spironolactone 100 Mg Tabs (Spironolactone) .... Once Daily 4)  Vitamin D3 1000 Unit  Tabs (Cholecalciferol) .... 2 Po Once Daily 5)  Vitamin B-12 1000 Mcg  Tabs (Cyanocobalamin) .Marland Kitchen.. 1 Qd 6)  Ferosul 325 (Otc) .... Once Daily 7)  Valtrex 1 Gm Tabs (Valacyclovir Hcl) .... Once Daily 8)  Ibuprofen 800 Mg Tabs (Ibuprofen) .... As Needed 9)  Freestyle Lite Test  Strp (Glucose Blood) .... Use Qd 10)  Freestyle Lancets  Misc (Lancets) .... Use Qd 11)  Align  Caps (Probiotic Product) .Marland Kitchen.. 1 By Mouth Once Daily For Your Intesinal Flora Restoraion 12)  Metformin Hcl 1000 Mg Tabs (Metformin Hcl) .Marland Kitchen.. 1 By Mouth Bid 13)  Nexium 40 Mg Cpdr (Esomeprazole Magnesium) .Marland Kitchen.. 1 By Mouth Once Daily For Acid Reflux  Allergies (verified): 1)  ! Codeine Sulfate (Codeine Sulfate) 2)  ! Percocet (Oxycodone-Acetaminophen) 3)  ! Maxzide-25  Associate Professor)  Past History:  Past Medical History: Last updated: 09/07/2009 Anemia-iron deficiency elevaed glucose OBESITY BMI =44 OSA on cpap Vit B12 low Vit D def Asthma Hypertension Sleep Apnea with CPAP GYN Dr Freda Jackson Diabetes mellitus, type II 2010 Depression 2011  Social History: Last updated: 08/30/2007 Occupation: 3d shift Married Never Smoked  Review of Systems       The patient complains of abdominal pain.  The patient denies fever, chest pain, dyspnea on exertion, melena, hematochezia, and severe indigestion/heartburn.         on a period now  Physical Exam  General:  NAD, overweight-appearing.   Nose:  no nasal discharge.   Mouth:  pharynx pink and moist.  good dentition, no gingival abnormalities, no dental plaque, no erythema, no exudates, and no postnasal drip.   Neck:  No deformities, masses, or tenderness noted. Heart:  RRR Abdomen:  S/NTsoft, non-tender, no masses, no guarding, no inguinal hernia, no hepatomegaly, and no splenomegaly.   Msk:  normal ROM.  LS NT Neurologic:  alert & oriented X3.   Skin:  Velvety hyperpim. in creases No rash   Impression & Recommendations:  Problem # 1:  LUQ PAIN (ICD-789.02) Assessment New  Orders: Radiology Referral (Radiology) Abd [place]  TLB-BMP (Basic Metabolic Panel-BMET) (80048-METABOL) TLB-CBC Platelet - w/Differential (85025-CBCD) TLB-Hepatic/Liver Function Pnl (80076-HEPATIC) TLB-Lipase (83690-LIPASE) TLB-Sedimentation Rate (ESR) (85652-ESR) TLB-Udip ONLY (81003-UDIP) TLB-H. Pylori Abs(Helicobacter Pylori) (86677-HELICO)  Problem # 2:  NAUSEA (ICD-787.02) Assessment: New  Orders: Radiology Referral (Radiology) TLB-BMP (Basic Metabolic Panel-BMET) (80048-METABOL) TLB-CBC Platelet - w/Differential (85025-CBCD) TLB-Hepatic/Liver Function Pnl (80076-HEPATIC) TLB-Lipase (83690-LIPASE) TLB-Sedimentation Rate (ESR) (85652-ESR) TLB-Udip ONLY (81003-UDIP) TLB-H. Pylori Abs(Helicobacter Pylori)  (86677-HELICO) TLB-Preg Serum Quant (B-hCG) (84702-HCG-QN)  Her updated medication list for this problem includes:    Transderm-scop 1.5 Mg Pt72 (Scopolamine base) .Marland Kitchen... Change q 3 d  Problem # 3:  DIABETES MELLITUS, TYPE II (ICD-250.00) Assessment: Comment Only  Her updated medication list for this problem includes:    Metformin Hcl 1000 Mg Tabs (Metformin hcl) .Marland Kitchen... 1 by mouth bid  Problem # 4:  ANEMIA-IRON DEFICIENCY (ICD-280.9) Assessment: Comment Only  Her updated medication list for this problem includes:    Vitamin B-12 1000 Mcg Tabs (Cyanocobalamin) .Marland Kitchen... 1 qd  Problem # 5:  Travel Assessment: New Scop patch  Complete Medication List: 1)  Necon 0.5/35 (28) 0.5-35 Mg-mcg Tabs (Norethindrone-eth estradiol) .... Bcp 2)  Ranitidine Hcl 300 Mg Tabs (Ranitidine hcl) .... At bedtime 3)  Spironolactone 100 Mg Tabs (Spironolactone) .... Once daily 4)  Vitamin D3 1000 Unit Tabs (Cholecalciferol) .... 2 po once daily 5)  Vitamin B-12 1000 Mcg Tabs (Cyanocobalamin) .Marland Kitchen.. 1 qd 6)  Ferosul 325 (otc)  .... Once daily 7)  Valtrex 1 Gm Tabs (Valacyclovir hcl) .... Once daily 8)  Ibuprofen 800 Mg Tabs (Ibuprofen) .... As needed 9)  Freestyle Lite Test Strp (Glucose blood) .... Use qd 10)  Freestyle Lancets Misc (Lancets) .... Use qd 11)  Align Caps (Probiotic product) .Marland Kitchen.. 1 by mouth once daily for your intesinal flora restoraion 12)  Metformin Hcl 1000 Mg Tabs (Metformin hcl) .Marland Kitchen.. 1 by mouth bid 13)  Nexium 40 Mg Cpdr (Esomeprazole magnesium) .Marland Kitchen.. 1 by mouth once daily for acid reflux 14)  Ciprofloxacin Hcl 500 Mg Tabs (Ciprofloxacin hcl) .Marland Kitchen.. 1 by mouth bid 15)  Promethazine Hcl 25 Mg Tabs (Promethazine hcl) .Marland Kitchen.. 1-2 by mouth four times a day as needed nausea 16)  Transderm-scop 1.5 Mg Pt72 (Scopolamine base) .... Change q 3 d  Patient Instructions: 1)  Call if you are not better in a reasonable amount of time or if worse. Go to ER if feeling really bad!   Prescriptions: TRANSDERM-SCOP 1.5 MG PT72 (SCOPOLAMINE BASE) change q 3 d  #5 x 0   Entered and Authorized by:   Tresa Garter MD   Signed by:   Tresa Garter MD on 01/18/2010   Method used:   Electronically to        [facility]* (retail)       [address]       [address] Shipping/mailing       [place], [place]  [address]       Ph: [phone]       Fax: [phone]   RxID:   [id number] PROMETHAZINE HCL 25 MG TABS (PROMETHAZINE HCL) 1-2 by mouth four times a day as needed nausea  #60 x 1   Entered and Authorized by:   Tresa Garter MD   Signed by:   Tresa Garter MD on 01/18/2010   Method used:   Electronically to        [facility]* (retail)       [address]  [address]       [address] Shipping/mailing       [place], [place]  [address]       Ph: [phone]       Fax: [phone]   RxID:   [id number]

## 2010-08-31 NOTE — Progress Notes (Signed)
Summary: Nexium request  Phone Note From Pharmacy   Caller: [facility]* Summary of Call: Pharmacy sent note that patient would like to try Nexium instead of coninuing Omeprazole. Please advise if this is ok. Initial call taken by: Lucious Groves,  Dec 17, 2009 11:44 AM  Follow-up for Phone Call        ok Nexium 40 mg by mouth once daily #30 12 ref Follow-up by: Tresa Garter MD,  Dec 17, 2009 12:53 PM  Additional Follow-up for Phone Call Additional follow up Details #1::        done. Additional Follow-up by: Lucious Groves,  Dec 17, 2009 2:03 PM    New/Updated Medications: NEXIUM 40 MG CPDR (ESOMEPRAZOLE MAGNESIUM) 1 by mouth qd Prescriptions: NEXIUM 40 MG CPDR (ESOMEPRAZOLE MAGNESIUM) 1 by mouth qd  #30 x 12   Entered by:   Lucious Groves   Authorized by:   Tresa Garter MD   Signed by:   Lucious Groves on 12/17/2009   Method used:   Electronically to        [facility]* (retail)       [address]       [address] Shipping/mailing       [place], [place]  [address]       Ph: [phone]       Fax: [phone]   RxID:   [id number]     Allergies: 1)  ! Codeine Sulfate (Codeine Sulfate) 2)  ! Percocet (Oxycodone-Acetaminophen) 3)  ! Maxzide-25 (Triamterene-Hctz)

## 2010-08-31 NOTE — Progress Notes (Signed)
Summary: spironolactone  Phone Note Refill Request Message from:  Fax from Pharmacy on November 04, 2009 1:16 PM  Refills Requested: Medication #1:  SPIRONOLACTONE 100 MG TABS once daily # 90  Method Requested: Electronic Initial call taken by: Orlan Leavens,  November 04, 2009 1:16 PM    Prescriptions: SPIRONOLACTONE 100 MG TABS (SPIRONOLACTONE) once daily  #90 x 1   Entered by:   Orlan Leavens   Authorized by:   Tresa Garter MD   Signed by:   Orlan Leavens on 11/04/2009   Method used:   Electronically to        [facility]* (retail)       [address]       [address] Shipping/mailing       [place], [place]  [address]       Ph: [phone]       Fax: [phone]   RxID:   [id number]

## 2010-08-31 NOTE — Assessment & Plan Note (Signed)
Summary: DIZZY/LIGHTHEADED/NAUSEA/PLOTNIKOV/CD   Vital Signs:  Patient profile:   40 year old female Height:      66 inches Weight:      281.50 pounds BMI:     45.60 O2 Sat:      97 % on Room air Temp:     98.5 degrees F oral Pulse rate:   80 / minute BP sitting:   118 / 82  (left arm) Cuff size:   large  Vitals Entered By: Zella Ball Ewing CMA (AAMA) (February 23, 2010 3:12 PM)  O2 Flow:  Room air CC: Dizzy, light headed, nauseated/RE   CC:  Dizzy, light headed, and nauseated/RE.  History of Present Illness: here with acute onset x 3 days mild to mod left earache, fullness and popping/crackling, vertigo and nausea the day she left the recent cruise ship;  no fever and no vomiting, no headache despite a fall and head strike on the cement step recently;  no blurred vision and Pt denies new neuro symptoms such as headache, facial or extremity weakness , falls or gait change or siezure.   Has some general weakness and malase;  is morbid obese but denies polydipsia, polyuria.  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) 7)  Chest Pain  (ICD-786.50) 8)  Depression  (ICD-311) 9)  Other Respiratory Problems Newborn After Birth  (ICD-770.89) 10)  Obesity  (ICD-278.00) 11)  Diabetes Mellitus, Type II  (ICD-250.00) 12)  Elevated Bp  (ICD-796.2) 13)  Cystitis  (ICD-595.9) 14)  Well Adult Exam  (ICD-V70.0) 15)  Vitamin D Deficiency  (ICD-268.9) 16)  B12 Deficiency  (ICD-266.2) 17)  Hair Loss  (ICD-704.00) 18)  Paresthesia  (ICD-782.0) 19)  Abnormal Glucose Nec  (ICD-790.29) 20)  Weight Gain  (ICD-783.1) 21)  Anemia-iron Deficiency  (ICD-280.9)  Medications Prior to Update: 1)  Necon 0.5/35 (28) 0.5-35 Mg-Mcg   Tabs (Norethindrone-Eth Estradiol) .... Bcp 2)  Ranitidine Hcl 300 Mg  Tabs (Ranitidine Hcl) .... At Bedtime 3)  Spironolactone 100 Mg Tabs (Spironolactone) .... Once Daily 4)  Vitamin D3 1000 Unit  Tabs (Cholecalciferol) .... 2 Po Once Daily 5)  Vitamin B-12 1000 Mcg  Tabs (Cyanocobalamin) .Marland Kitchen.. 1 Qd 6)  Ferosul 325 (Otc) .... Once Daily 7)  Valtrex 1 Gm Tabs (Valacyclovir Hcl) .... Once Daily 8)  Ibuprofen 800 Mg Tabs (Ibuprofen) .... As Needed 9)  Freestyle Lite Test  Strp (Glucose Blood) .... Use Qd 10)  Freestyle Lancets  Misc (Lancets) .... Use Qd 11)  Align  Caps (Probiotic Product) .Marland Kitchen.. 1 By Mouth Once Daily For Your Intesinal Flora Restoraion 12)  Metformin Hcl 1000 Mg Tabs (Metformin Hcl) .Marland Kitchen.. 1 By Mouth Bid 13)  Nexium 40 Mg Cpdr (Esomeprazole Magnesium) .Marland Kitchen.. 1 By Mouth Once Daily For Acid Reflux 14)  Ciprofloxacin Hcl 500 Mg Tabs (Ciprofloxacin Hcl) .Marland Kitchen.. 1 By Mouth Bid 15)  Promethazine Hcl 25 Mg Tabs (Promethazine Hcl) .Marland Kitchen.. 1-2 By Mouth Four Times A Day As Needed Nausea 16)  Transderm-Scop 1.5 Mg Pt72 (Scopolamine Base) .... Change Q 3 D  Current Medications (verified): 1)  Necon 0.5/35 (28) 0.5-35 Mg-Mcg  Tabs (Norethindrone-Eth Estradiol) .... Bcp 2)  Ranitidine Hcl 300 Mg  Tabs (Ranitidine Hcl) .... At  Bedtime 3)  Spironolactone 100 Mg Tabs (Spironolactone) .... Once Daily 4)  Vitamin D3 1000 Unit  Tabs (Cholecalciferol) .... 2 Po Once Daily 5)  Vitamin B-12 1000 Mcg  Tabs (Cyanocobalamin) .Marland Kitchen.. 1 Qd 6)  Ferosul 325 (Otc) .... Once Daily 7)  Valtrex 1 Gm Tabs (Valacyclovir Hcl) .... Once Daily 8)  Ibuprofen 800 Mg Tabs (Ibuprofen) .... As Needed 9)  Freestyle Lite Test  Strp (Glucose Blood) .... Use Qd 10)  Freestyle Lancets  Misc (Lancets) .... Use Qd 11)  Align  Caps (Probiotic Product) .Marland Kitchen.. 1 By Mouth Once Daily For Your Intesinal Flora Restoraion 12)  Metformin Hcl 1000 Mg Tabs (Metformin Hcl) .Marland Kitchen.. 1 By Mouth Bid 13)  Nexium 40 Mg Cpdr (Esomeprazole Magnesium) .Marland Kitchen.. 1  By Mouth Once Daily For Acid Reflux 14)  Promethazine Hcl 25 Mg Tabs (Promethazine Hcl) .Marland Kitchen.. 1-2 By Mouth Four Times A Day As Needed Nausea 15)  Meclizine Hcl 12.5 Mg Tabs (Meclizine Hcl) .Marland Kitchen.. 1po Q 6 Hrs As Needed Dizziness 16)  Cephalexin 500 Mg Caps (Cephalexin) .Marland Kitchen.. 1po Three Times A Day  Allergies (verified): 1)  ! Codeine Sulfate (Codeine Sulfate) 2)  ! Percocet (Oxycodone-Acetaminophen) 3)  ! Maxzide-25 Associate Professor)  Past History:  Past Medical History: Last updated: 09/07/2009 Anemia-iron deficiency elevaed glucose OBESITY BMI =44 OSA on cpap Vit B12 low Vit D def Asthma Hypertension Sleep Apnea with CPAP GYN Dr Freda Jackson Diabetes mellitus, type II 2010 Depression 2011  Past Surgical History: Last updated: 01/16/2008 Fibroidectomy  2002 Tonsillectomy C-section Myomectomy  Social History: Last updated: 08/30/2007 Occupation: 3d shift Married Never Smoked  Risk Factors: Smoking Status: never (10/05/2009)  Review of Systems       all otherwise negative per pt -    Physical Exam  General:  alert and overweight-appearing., mild ill  Head:  normocephalic and atraumatic.   Eyes:  vision grossly intact, pupils equal, and pupils round.   Ears:  left tm severe erythema with post fluid, mild bulge, canals clear, right TM clear, sinus nontender Nose:  no external deformity and no nasal discharge.   Mouth:  pharyngeal erythema and fair dentition.   Neck:  supple and cervical lymphadenopathy - nontender submandibular minor Lungs:  normal respiratory effort and normal breath sounds.   Heart:  normal rate and regular rhythm.   Extremities:  no edema, no erythema  Neurologic:  cranial nerves II-XII intact observationally, strength normal in all extremities, gait normal, DTRs symmetrical and normal, and finger-to-nose normal.     Impression & Recommendations:  Problem # 1:  OTITIS MEDIA, LEFT (ICD-382.9)  The following medications were removed from the  medication list:    Ciprofloxacin Hcl 500 Mg Tabs (Ciprofloxacin hcl) .Marland Kitchen... 1 by mouth bid Her updated medication list for this problem includes:    Ibuprofen 800 Mg Tabs (Ibuprofen) .Marland Kitchen... As needed    Cephalexin 500 Mg Caps (Cephalexin) .Marland Kitchen... 1po three times a day treat as above, f/u any worsening signs or symptoms   Problem # 2:  INTERMITTENT VERTIGO (ICD-780.4)  Her updated medication list for this problem includes:    Promethazine Hcl 25 Mg Tabs (Promethazine hcl) .Marland Kitchen... 1-2 by mouth four times a day as needed nausea    Meclizine Hcl 12.5 Mg Tabs (Meclizine hcl) .Marland Kitchen... 1po q 6 hrs as needed dizziness due to above most likely, ok for meclicine as needed, phenergan as needed   Problem # 3:  UNSPECIFIED EUSTACHIAN TUBE DISORDER (ICD-381.9) also for mucinex otc as needed  Problem # 4:  DIABETES MELLITUS, TYPE II (ICD-250.00)  Her updated medication list for this problem includes:    Metformin Hcl 1000 Mg Tabs (Metformin hcl) .Marland Kitchen... 1 by mouth bid  Labs Reviewed: Creat: 0.6 (01/18/2010)    Reviewed HgBA1c results: 7.3 (09/29/2009)  7.9 (07/13/2009) stable overall by hx and exam, ok to continue meds/tx as is   Complete Medication List: 1)  Necon 0.5/35 (28) 0.5-35 Mg-mcg Tabs (Norethindrone-eth estradiol) .... Bcp 2)  Ranitidine Hcl 300 Mg Tabs (Ranitidine hcl) .... At bedtime 3)  Spironolactone 100 Mg Tabs (Spironolactone) .... Once daily 4)  Vitamin D3 1000 Unit Tabs (Cholecalciferol) .... 2 po once daily 5)  Vitamin B-12 1000 Mcg Tabs (Cyanocobalamin) .Marland Kitchen.. 1 qd 6)  Ferosul 325 (otc)  .... Once daily 7)  Valtrex 1 Gm Tabs (Valacyclovir hcl) .... Once daily 8)  Ibuprofen 800 Mg Tabs (Ibuprofen) .... As needed 9)  Freestyle Lite Test Strp (Glucose blood) .... Use qd 10)  Freestyle Lancets Misc (Lancets) .... Use qd 11)  Align Caps (Probiotic product) .Marland Kitchen.. 1 by mouth once daily for your intesinal flora restoraion 12)  Metformin Hcl 1000 Mg Tabs (Metformin hcl) .Marland Kitchen.. 1 by mouth  bid 13)  Nexium 40 Mg Cpdr (Esomeprazole magnesium) .Marland Kitchen.. 1 by mouth once daily for acid reflux 14)  Promethazine Hcl 25 Mg Tabs (Promethazine hcl) .Marland Kitchen.. 1-2 by mouth four times a day as needed nausea 15)  Meclizine Hcl 12.5 Mg Tabs (Meclizine hcl) .Marland Kitchen.. 1po q 6 hrs as needed dizziness 16)  Cephalexin 500 Mg Caps (Cephalexin) .Marland Kitchen.. 1po three times a day  Patient Instructions: 1)  Please take all new medications as prescribed 2)  Continue all previous medications as before this visit  3)  You can also use Mucinex OTC or it's generic for congestion  Prescriptions: PROMETHAZINE HCL 25 MG TABS (PROMETHAZINE HCL) 1-2 by mouth four times a day as needed nausea  #60 x 1   Entered and Authorized by:   Corwin Levins MD   Signed by:   Corwin Levins MD on 02/23/2010   Method used:   Print then Give to Patient   RxID:   [id number] MECLIZINE HCL 12.5 MG TABS (MECLIZINE HCL) 1po q 6 hrs as needed dizziness  #40 x 1   Entered and Authorized by:   Corwin Levins MD   Signed by:   Corwin Levins MD on 02/23/2010   Method used:   Print then Give to Patient   RxID:   [id number] CEPHALEXIN 500 MG CAPS (CEPHALEXIN) 1po three times a day  #30 x 0   Entered and Authorized by:   Corwin Levins MD   Signed by:   Corwin Levins MD on 02/23/2010   Method used:   Print then Give to Patient   RxID:   [id number]

## 2010-08-31 NOTE — Progress Notes (Signed)
Summary: OMEPRAZOLE  Phone Note Call from Patient   Summary of Call: Patient is requesting rx for omeprazole 40mg . She says nexium is not working as well. OK to change?  Initial call taken by: Lamar Sprinkles, CMA,  May 26, 2010 3:29 PM  Follow-up for Phone Call        ok Follow-up by: Tresa Garter MD,  May 26, 2010 5:25 PM  Additional Follow-up for Phone Call Additional follow up Details #1::        Pt informed  Additional Follow-up by: Lamar Sprinkles, CMA,  May 26, 2010 5:38 PM    New/Updated Medications: OMEPRAZOLE 40 MG CPDR (OMEPRAZOLE) 1 by mouth qam for indigestion Prescriptions: OMEPRAZOLE 40 MG CPDR (OMEPRAZOLE) 1 by mouth qam for indigestion  #90 x 3   Entered and Authorized by:   Tresa Garter MD   Signed by:   Lamar Sprinkles, CMA on 05/26/2010   Method used:   Electronically to        [facility]* (retail)       [address]       [address] Shipping/mailing       [place], [place]  [address]       Ph: [phone]       Fax: [phone]   RxID:   [id number]

## 2010-08-31 NOTE — Assessment & Plan Note (Signed)
Summary: 1 MO ROV /NWS  #   Vital Signs:  Patient profile:   40 year old female Weight:      280 pounds Temp:     98.7 degrees F oral Pulse rate:   89 / minute BP sitting:   124 / 74  (left arm)  Vitals Entered By: Tora Perches (October 05, 2009 9:19 AM) CC: f/u Is Patient Diabetic? Yes Comments pt state she did not get the lexapro filled and that  she is not going to take the med.//vg    CC:  f/u.  History of Present Illness: The patient presents for a follow up of obesity, depression, HTN C/o CP a cople wks ago seconds long onL side shooting to her back - no reoccurence  Preventive Screening-Counseling & Management  Alcohol-Tobacco     Smoking Status: never  Current Medications (verified): 1)  Necon 0.5/35 (28) 0.5-35 Mg-Mcg  Tabs (Norethindrone-Eth Estradiol) .... Bcp 2)  Ranitidine Hcl 300 Mg  Tabs (Ranitidine Hcl) .... At Bedtime 3)  Spironolactone 100 Mg Tabs (Spironolactone) .... Once Daily 4)  Vitamin D3 1000 Unit  Tabs (Cholecalciferol) .... 2 Po Once Daily 5)  Vitamin B-12 1000 Mcg  Tabs (Cyanocobalamin) .Marland Kitchen.. 1 Qd 6)  Omeprazole 20 Mg Cpdr (Omeprazole) .... 2  By Mouth Daily 7)  Ferosul 325 (Otc) .... Once Daily 8)  Valtrex 1 Gm Tabs (Valacyclovir Hcl) .... Once Daily 9)  Ibuprofen 800 Mg Tabs (Ibuprofen) .... As Needed 10)  Metformin Hcl 500 Mg Tabs (Metformin Hcl) .Marland Kitchen.. 1 By Mouth Two Times A Day For Diabetes 11)  Freestyle Lite Test  Strp (Glucose Blood) .... Use Qd 12)  Freestyle Lancets  Misc (Lancets) .... Use Qd 13)  Align  Caps (Probiotic Product) .Marland Kitchen.. 1 By Mouth Once Daily For Your Intesinal Flora Restoraion  Allergies: 1)  ! Codeine Sulfate (Codeine Sulfate) 2)  ! Percocet (Oxycodone-Acetaminophen) 3)  ! Maxzide-25 Associate Professor)  Past History:  Past Medical History: Last updated: 09/07/2009 Anemia-iron deficiency elevaed glucose OBESITY BMI =44 OSA on cpap Vit B12 low Vit D def Asthma Hypertension Sleep Apnea with CPAP GYN Dr Freda Jackson Diabetes mellitus, type II 2010 Depression 2011  Past Surgical History: Last updated: 01/16/2008 Fibroidectomy  2002 Tonsillectomy C-section Myomectomy  Family History: Last updated: 08/30/2007 Family History Hypertension  Social History: Last updated: 08/30/2007 Occupation: 3d shift Married Never Smoked  Physical Exam  General:  NAD, overweight-appearing.   Ears:  External ear exam shows no significant lesions or deformities.  Otoscopic examination reveals clear canals, tympanic membranes are intact bilaterally without bulging, retraction, inflammation or discharge. Hearing is grossly normal bilaterally. Nose:  no nasal discharge.   Mouth:  pharynx pink and moist.  good dentition, no gingival abnormalities, no dental plaque, no erythema, no exudates, and no postnasal drip.   Lungs:  CTA Heart:  RRR Abdomen:  S/NT Msk:  normal ROM.   Neurologic:  alert & oriented X3.   Skin:  Velvety hyperpim. in creases Psych:  memory intact for recent and remote, normally interactive, and good eye contact.  not suicidal, depressed affect, and tearful.     Impression & Recommendations:  Problem # 1:  DIABETES MELLITUS, TYPE II (ICD-250.00) Assessment Deteriorated  The following medications were removed from the medication list:    Metformin Hcl 500 Mg Tabs (Metformin hcl) .Marland Kitchen... 1 by mouth two times a day for diabetes Her updated medication list for this problem includes:    Metformin Hcl 1000 Mg Tabs (  Metformin hcl) .Marland Kitchen... 1 by mouth bid  Problem # 2:  ACANTHOSIS NIGRICANS (ICD-701.2) Assessment: New Treat #1  Problem # 3:  DEPRESSION (ICD-311) Assessment: Improved  The following medications were removed from the medication list:    Lexapro 10 Mg Tabs (Escitalopram oxalate) She has not used it yet...  Problem # 4:  OBESITY (ICD-278.00) Assessment: Comment Only See "Patient Instructions". Lap band suggested  Problem # 5:  CHEST PAIN (ICD-786.50) likely GERD  related Assessment: New  Orders: EKG w/ Interpretation (93000)  Complete Medication List: 1)  Necon 0.5/35 (28) 0.5-35 Mg-mcg Tabs (Norethindrone-eth estradiol) .... Bcp 2)  Ranitidine Hcl 300 Mg Tabs (Ranitidine hcl) .... At bedtime 3)  Spironolactone 100 Mg Tabs (Spironolactone) .... Once daily 4)  Vitamin D3 1000 Unit Tabs (Cholecalciferol) .... 2 po once daily 5)  Vitamin B-12 1000 Mcg Tabs (Cyanocobalamin) .Marland Kitchen.. 1 qd 6)  Omeprazole 20 Mg Cpdr (Omeprazole) .... 2  by mouth daily 7)  Ferosul 325 (otc)  .... Once daily 8)  Valtrex 1 Gm Tabs (Valacyclovir hcl) .... Once daily 9)  Ibuprofen 800 Mg Tabs (Ibuprofen) .... As needed 10)  Freestyle Lite Test Strp (Glucose blood) .... Use qd 11)  Freestyle Lancets Misc (Lancets) .... Use qd 12)  Align Caps (Probiotic product) .Marland Kitchen.. 1 by mouth once daily for your intesinal flora restoraion 13)  Metformin Hcl 1000 Mg Tabs (Metformin hcl) .Marland Kitchen.. 1 by mouth bid  Patient Instructions: 1)  [email or web address] 2)  Please schedule a follow-up appointment in 4 months. 3)  BMP prior to visit, ICD-9: 4)  HbgA1C prior to visit, ICD-9:250.00 Prescriptions: METFORMIN HCL 1000 MG TABS (METFORMIN HCL) 1 by mouth bid  #60 x 12   Entered and Authorized by:   Tresa Garter MD   Signed by:   Tresa Garter MD on 10/05/2009   Method used:   Print then Give to Patient   RxID:   [id number]

## 2010-08-31 NOTE — Assessment & Plan Note (Signed)
Summary: DEPRESSION - REQ OV TODAY/CD   Vital Signs:  Patient profile:   40 year old female Weight:      282 pounds Temp:     98.8 degrees F oral Pulse rate:   88 / minute BP sitting:   122 / 88  (left arm)  Vitals Entered By: Tora Perches (September 07, 2009 10:48 AM) CC: depression Is Patient Diabetic? Yes   CC:  depression.  History of Present Illness: C/o bad breath x long time, thinks people making comments C/o depression and stress at home/work F/u DM  Preventive Screening-Counseling & Management  Alcohol-Tobacco     Smoking Status: never  Current Medications (verified): 1)  Necon 0.5/35 (28) 0.5-35 Mg-Mcg  Tabs (Norethindrone-Eth Estradiol) .... Bcp 2)  Ranitidine Hcl 300 Mg  Tabs (Ranitidine Hcl) .... At Bedtime 3)  Spironolactone 100 Mg Tabs (Spironolactone) .... Once Daily 4)  Vitamin D3 1000 Unit  Tabs (Cholecalciferol) .... 2 Po Once Daily 5)  Vitamin B-12 1000 Mcg  Tabs (Cyanocobalamin) .Marland Kitchen.. 1 Qd 6)  Omeprazole 20 Mg Cpdr (Omeprazole) .... 2  By Mouth Daily 7)  Ferosul 325 (Otc) .... Once Daily 8)  Valtrex 1 Gm Tabs (Valacyclovir Hcl) .... Once Daily 9)  Ibuprofen 800 Mg Tabs (Ibuprofen) .... As Needed 10)  Metformin Hcl 500 Mg Tabs (Metformin Hcl) .Marland Kitchen.. 1 By Mouth Two Times A Day For Diabetes 11)  Freestyle Lite Test  Strp (Glucose Blood) .... Use Qd 12)  Freestyle Lancets  Misc (Lancets) .... Use Qd  Allergies: 1)  ! Codeine Sulfate (Codeine Sulfate) 2)  ! Percocet (Oxycodone-Acetaminophen) 3)  ! Maxzide-25 (Triamterene-Hctz)  Past History:  Past Surgical History: Last updated: 01/16/2008 Fibroidectomy  2002 Tonsillectomy C-section Myomectomy  Social History: Last updated: 08/30/2007 Occupation: 3d shift Married Never Smoked  Past Medical History: Anemia-iron deficiency elevaed glucose OBESITY BMI =44 OSA on cpap Vit B12 low Vit D def Asthma Hypertension Sleep Apnea with CPAP GYN Dr Freda Jackson Diabetes mellitus, type II  2010 Depression 2011  Family History: Reviewed history from 08/30/2007 and no changes required. Family History Hypertension  Social History: Reviewed history from 08/30/2007 and no changes required. Occupation: 3d shift Married Never Smoked  Review of Systems  The patient denies fever, vision loss, syncope, abdominal pain, and severe indigestion/heartburn.    Physical Exam  General:  overweight-appearing.   Ears:  External ear exam shows no significant lesions or deformities.  Otoscopic examination reveals clear canals, tympanic membranes are intact bilaterally without bulging, retraction, inflammation or discharge. Hearing is grossly normal bilaterally. Nose:  no nasal discharge.   Mouth:  pharynx pink and moist.  good dentition, no gingival abnormalities, no dental plaque, no erythema, no exudates, and no postnasal drip.   Neck:  No deformities, masses, or tenderness noted. Lungs:  CTA Heart:  RRR Abdomen:  S/NT Msk:  normal ROM.   Neurologic:  alert & oriented X3.   Skin:  Intact without suspicious lesions or rashes Psych:  memory intact for recent and remote, normally interactive, and good eye contact.  not suicidal, depressed affect, and tearful.     Impression & Recommendations:  Problem # 1:  Halitosis Assessment Unchanged [place] and Align course Check H pilori  Problem # 2:  DIABETES MELLITUS, TYPE II (ICD-250.00) Assessment: Improved  Her updated medication list for this problem includes:    Metformin Hcl 500 Mg Tabs (Metformin hcl) .Marland Kitchen... 1 by mouth two times a day for diabetes  Problem # 3:  OBESITY (  ICD-278.00) Assessment: Improved  Problem # 4:  ANEMIA-IRON DEFICIENCY (ICD-280.9) Assessment: Comment Only  Her updated medication list for this problem includes:    Vitamin B-12 1000 Mcg Tabs (Cyanocobalamin) .Marland Kitchen... 1 qd  Problem # 5:  DEPRESSION (ICD-311) situational Assessment: New  Complete Medication List: 1)  Necon 0.5/35 (28) 0.5-35 Mg-mcg Tabs  (Norethindrone-eth estradiol) .... Bcp 2)  Ranitidine Hcl 300 Mg Tabs (Ranitidine hcl) .... At bedtime 3)  Spironolactone 100 Mg Tabs (Spironolactone) .... Once daily 4)  Vitamin D3 1000 Unit Tabs (Cholecalciferol) .... 2 po once daily 5)  Vitamin B-12 1000 Mcg Tabs (Cyanocobalamin) .Marland Kitchen.. 1 qd 6)  Omeprazole 20 Mg Cpdr (Omeprazole) .... 2  by mouth daily 7)  Ferosul 325 (otc)  .... Once daily 8)  Valtrex 1 Gm Tabs (Valacyclovir hcl) .... Once daily 9)  Ibuprofen 800 Mg Tabs (Ibuprofen) .... As needed 10)  Metformin Hcl 500 Mg Tabs (Metformin hcl) .Marland Kitchen.. 1 by mouth two times a day for diabetes 11)  Freestyle Lite Test Strp (Glucose blood) .... Use qd 12)  Freestyle Lancets Misc (Lancets) .... Use qd 13)  Metronidazole 250 Mg Tabs (Metronidazole) .Marland Kitchen.. 1 by mouth qid 14)  Align Caps (Probiotic product) .Marland Kitchen.. 1 by mouth once daily for your intesinal flora restoraion 15)  Lexapro 10 Mg Tabs (Escitalopram oxalate)  Patient Instructions: 1)  Please schedule a follow-up appointment in 1 month. 2)  BMP prior to visit, ICD-9: 3)  Hepatic Panel prior to visit, ICD-9: 4)  TSH prior to visit, ICD-9: 5)  HbgA1C prior to visit, ICD-9: 6)  Vit B12 7)  H pilori 250.00 995.20  266.20 8)  Use the Sinus rinse as needed  Prescriptions: LEXAPRO 10 MG TABS (ESCITALOPRAM OXALATE)   #30 x 6   Entered and Authorized by:   Tresa Garter MD   Signed by:   Tresa Garter MD on 09/07/2009   Method used:   Print then Give to Patient   RxID:   [id number] ALIGN  CAPS (PROBIOTIC PRODUCT) 1 by mouth once daily for your intesinal flora restoraion  #30 x 1   Entered and Authorized by:   Tresa Garter MD   Signed by:   Tresa Garter MD on 09/07/2009   Method used:   Electronically to        [facility]* (retail)       [address]       [address] Shipping/mailing       [place], [place]  [address]       Ph: [phone]       Fax: [phone]   RxID:    [id number] METRONIDAZOLE 250 MG TABS (METRONIDAZOLE) 1 by mouth qid  #56 x 1   Entered and Authorized by:   Tresa Garter MD   Signed by:   Tresa Garter MD on 09/07/2009   Method used:   Electronically to        [facility]* (retail)       [address]       [address] Shipping/mailing       [place], [place]  [address]       Ph: [phone]       Fax: [phone]   RxID:   [id number]

## 2010-09-02 ENCOUNTER — Encounter: Payer: Self-pay | Admitting: Internal Medicine

## 2010-09-02 NOTE — Assessment & Plan Note (Signed)
Summary: 3 mos f/u #/cd   Vital Signs:  Patient profile:   40 year old female Height:      66 inches Weight:      280 pounds BMI:     45.36 Temp:     99.1 degrees F oral Pulse rate:   76 / minute Pulse rhythm:   regular Resp:     16 per minute BP sitting:   120 / 90  (left arm) Cuff size:   large  Vitals Entered By: Lanier Prude, CMA(AAMA) (August 20, 2010 2:02 PM) CC: 3 mo f/u  Is Patient Diabetic? Yes Comments pt is not taking Ranitidine, Vit d, vit b12, Alighn, Promethazine, meclizine or Hibiclens   CC:  3 mo f/u .  History of Present Illness: The patient presents for a follow up of hypertension, diabetes, hyperlipidemia  C/o abd pain at times in epig area with meals or after; she would burp and belch a lot for 2-3 d following. It may be happening weekly. She choked on large pills a couple times  Current Medications (verified): 1)  Necon 0.5/35 (28) 0.5-35 Mg-Mcg  Tabs (Norethindrone-Eth Estradiol) .... Bcp 2)  Ranitidine Hcl 300 Mg  Tabs (Ranitidine Hcl) .... At Bedtime 3)  Spironolactone 100 Mg Tabs (Spironolactone) .... Once Daily 4)  Vitamin D3 1000 Unit  Tabs (Cholecalciferol) .... 2 Po Once Daily 5)  Vitamin B-12 1000 Mcg  Tabs (Cyanocobalamin) .Marland Kitchen.. 1 Qd 6)  Ferosul 325 (Otc) .... Once Daily 7)  Valtrex 1 Gm Tabs (Valacyclovir Hcl) .... Once Daily 8)  Ibuprofen 800 Mg Tabs (Ibuprofen) .... As Needed 9)  Freestyle Lite Test  Strp (Glucose Blood) .... Use Qd 10)  Freestyle Lancets  Misc (Lancets) .... Use Qd 11)  Align  Caps (Probiotic Product) .Marland Kitchen.. 1 By Mouth Once Daily For Your Intesinal Flora Restoraion 12)  Metformin Hcl 1000 Mg Tabs (Metformin Hcl) .Marland Kitchen.. 1 By Mouth Bid 13)  Promethazine Hcl 25 Mg Tabs (Promethazine Hcl) .Marland Kitchen.. 1-2 By Mouth Four Times A Day As Needed Nausea 14)  Meclizine Hcl 12.5 Mg Tabs (Meclizine Hcl) .Marland Kitchen.. 1po Q 6 Hrs As Needed Dizziness 15)  Hibiclens 4 % Liqd (Chlorhexidine Gluconate) .... Use For Shower 2/wk 16)  Omeprazole 40 Mg Cpdr  (Omeprazole) .Marland Kitchen.. 1 By Mouth Qam For Indigestion 17)  Clotrimazole 1 % Crea (Clotrimazole) .... As Needed  Allergies (verified): 1)  ! Codeine Sulfate (Codeine Sulfate) 2)  ! Percocet (Oxycodone-Acetaminophen) 3)  ! Maxzide-25 (Triamterene-Hctz)  Past History:  Past Surgical History: Last updated: 01/16/2008 Fibroidectomy  2002 Tonsillectomy C-section Myomectomy  Past Medical History: Reviewed history from 09/07/2009 and no changes required. 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  The patient denies fever, dyspnea on exertion, and depression.         sad  Physical Exam  General:  alert and overweight-appearing Eyes:  vision grossly intact, pupils equal, and pupils round.   Ears:  left tm severe erythema with post fluid, mild bulge, canals clear, right TM clear, sinus nontender Mouth:  no pharyngeal erythema and fair dentition.   Lungs:  normal respiratory effort and normal breath sounds.   Heart:  normal  rate and regular rhythm.   Abdomen:  S/NTsoft, non-tender, no masses, no guarding, no inguinal hernia, no hepatomegaly, and no splenomegaly.   Msk:  normal ROM.  LS NT Neurologic:  cranial nerves II-XII intact observationally, strength normal in all extremities, gait normal, DTRs symmetrical and normal, and finger-to-nose normal.   Skin:  Velvety hyperpim. in creases No rash Psych:  memory intact for recent and remote, normally interactive, and good eye contact.  not suicidal, depressed affect, and tearful.     Impression & Recommendations:  Problem # 1:  ABDOMINAL PAIN, EPIGASTRIC (ICD-789.06) Assessment New Poss biliary  dysfunction Orders: Radiology Referral (Radiology) for a HIDA scan  Problem # 2:  NAUSEA (ICD-787.02)/burping Assessment: Deteriorated  Will get a HIDA scan Her updated medication list for this problem includes:    Meclizine Hcl 12.5 Mg Tabs (Meclizine hcl) .Marland Kitchen... 1po q 6 hrs as needed dizziness  Orders: Radiology Referral (Radiology)  Problem # 3:  DIABETES MELLITUS, TYPE II (ICD-250.00) Assessment: Deteriorated  The following medications were removed from the medication list:    Metformin Hcl 1000 Mg Tabs (Metformin hcl) .Marland Kitchen... 1 by mouth bid Her updated medication list for this problem includes:    Kombiglyze Xr 2.11-998 Mg Xr24h-tab (Saxagliptin-metformin) .Marland Kitchen... 1 by mouth two times a day for diabetes  Problem # 4:  DEPRESSION (ICD-311) Assessment: Unchanged Declined Rx  Complete Medication List: 1)  Necon 0.5/35 (28) 0.5-35 Mg-mcg Tabs (Norethindrone-eth estradiol) .... Bcp 2)  Ranitidine Hcl 300 Mg Tabs (Ranitidine hcl) .... At bedtime 3)  Spironolactone 100 Mg Tabs (Spironolactone) .... Once daily 4)  Vitamin D3 1000 Unit Tabs (Cholecalciferol) .... 2 po once daily 5)  Vitamin B-12 1000 Mcg Tabs (Cyanocobalamin) .Marland Kitchen.. 1 qd 6)  Ferosul 325 (otc)  .... Once daily 7)  Valtrex 1 Gm Tabs (Valacyclovir hcl) .... Once daily 8)  Ibuprofen 800 Mg Tabs (Ibuprofen) .... As needed 9)  Freestyle Lite Test Strp (Glucose blood) .... Use qd 10)  Freestyle Lancets Misc (Lancets) .... Use qd 11)  Align Caps (Probiotic product) .Marland Kitchen.. 1 by mouth once daily for your intesinal flora restoraion 12)  Promethazine Hcl 25 Mg Tabs (Promethazine hcl) .Marland Kitchen.. 1-2 by mouth four times a day as needed nausea 13)  Meclizine Hcl 12.5 Mg Tabs (Meclizine hcl) .Marland Kitchen.. 1po q 6 hrs as needed dizziness 14)  Hibiclens 4 % Liqd (Chlorhexidine gluconate) .... Use for shower 2/wk 15)  Clotrimazole 1 % Crea (Clotrimazole) .... As needed 16)  Kombiglyze Xr 2.11-998 Mg Xr24h-tab (Saxagliptin-metformin) .Marland Kitchen.. 1 by mouth two  times a day for diabetes 17)  Protonix 40 Mg Tbec (Pantoprazole sodium) .Marland Kitchen.. 1 by mouth qam for indigestion  Patient Instructions: 1)  Please schedule a follow-up appointment in 3 months. 2)  BMP prior to visit, ICD-9: 3)  Hepatic Panel prior to visit, ICD-9: 4)  HbgA1C prior to visit, ICD-9: 5)  Lipase 250.00  530.81 Prescriptions: PROTONIX 40 MG TBEC (PANTOPRAZOLE SODIUM) 1 by mouth qam for indigestion  #90 x 3   Entered and Authorized by:   Tresa Garter MD   Signed by:   Tresa Garter MD on 08/20/2010   Method used:   Electronically to        [facility]* (retail)       [address]       [address] Shipping/mailing       [place], [place]  [address]       Ph: [phone]  Fax: [phone]   RxID:   [id number] KOMBIGLYZE XR 2.11-998 MG XR24H-TAB (SAXAGLIPTIN-METFORMIN) 1 by mouth two times a day for diabetes  #180 x 11   Entered and Authorized by:   Tresa Garter MD   Signed by:   Tresa Garter MD on 08/20/2010   Method used:   Electronically to        [facility]* (retail)       [address]       [address] Shipping/mailing       [place], [place]  [address]       Ph: [phone]       Fax: [phone]   RxID:   [id number]    Orders Added: 1)  Radiology Referral [Radiology] 2)  Est. Patient Level IV [[address]]

## 2010-09-28 NOTE — Consult Note (Signed)
Summary: Kerry Kass MD  Kerry Kass MD   Imported By: Lester  09/21/2010 07:15:21  _____________________________________________________________________  External Attachment:    Type:   Image     Comment:   External Document

## 2010-11-02 ENCOUNTER — Telehealth: Payer: Self-pay | Admitting: Internal Medicine

## 2010-11-02 NOTE — Telephone Encounter (Signed)
Pt states that she began taking Kombiglyze on Sunday [finished up Metformin on hand since January 2012 OV]. Pt c/o severe abdominal pains, nausea & vomiting, bad leg cramps. Instructed Pt to stop new med and wait for further instructions.  Does she need to come in for OV? Please advise.

## 2010-11-03 ENCOUNTER — Other Ambulatory Visit: Payer: Self-pay | Admitting: Internal Medicine

## 2010-11-03 NOTE — Telephone Encounter (Signed)
Pt was changed to Kombiglyze.... Please advise because we rec req for metformin.

## 2010-11-03 NOTE — Telephone Encounter (Signed)
Go back on Metformin

## 2010-11-03 NOTE — Telephone Encounter (Signed)
Agree. OV if not better. Thank you!

## 2010-11-03 NOTE — Telephone Encounter (Signed)
Pt & Pharmacy informed.

## 2010-11-09 ENCOUNTER — Other Ambulatory Visit: Payer: Self-pay

## 2010-11-16 ENCOUNTER — Ambulatory Visit: Payer: Self-pay | Admitting: Internal Medicine

## 2010-12-03 ENCOUNTER — Other Ambulatory Visit: Payer: Self-pay

## 2010-12-03 ENCOUNTER — Other Ambulatory Visit: Payer: Self-pay | Admitting: Internal Medicine

## 2010-12-03 DIAGNOSIS — E119 Type 2 diabetes mellitus without complications: Secondary | ICD-10-CM

## 2010-12-03 DIAGNOSIS — K219 Gastro-esophageal reflux disease without esophagitis: Secondary | ICD-10-CM

## 2010-12-06 ENCOUNTER — Other Ambulatory Visit (INDEPENDENT_AMBULATORY_CARE_PROVIDER_SITE_OTHER): Payer: Commercial Managed Care - PPO

## 2010-12-06 DIAGNOSIS — K219 Gastro-esophageal reflux disease without esophagitis: Secondary | ICD-10-CM

## 2010-12-06 DIAGNOSIS — E119 Type 2 diabetes mellitus without complications: Secondary | ICD-10-CM

## 2010-12-06 LAB — HEPATIC FUNCTION PANEL
AST: 10 U/L (ref 0–37)
Alkaline Phosphatase: 89 U/L (ref 39–117)
Bilirubin, Direct: 0.1 mg/dL (ref 0.0–0.3)
Total Bilirubin: 0.6 mg/dL (ref 0.3–1.2)
Total Protein: 6.8 g/dL (ref 6.0–8.3)

## 2010-12-06 LAB — LIPASE: Lipase: 19 U/L (ref 11.0–59.0)

## 2010-12-06 LAB — BASIC METABOLIC PANEL
CO2: 26 mEq/L (ref 19–32)
Sodium: 137 mEq/L (ref 135–145)

## 2010-12-08 ENCOUNTER — Encounter: Payer: Self-pay | Admitting: Internal Medicine

## 2010-12-10 ENCOUNTER — Encounter: Payer: Self-pay | Admitting: Internal Medicine

## 2010-12-10 ENCOUNTER — Ambulatory Visit (INDEPENDENT_AMBULATORY_CARE_PROVIDER_SITE_OTHER): Payer: 59 | Admitting: Internal Medicine

## 2010-12-10 VITALS — BP 110/70 | HR 80 | Temp 98.8°F | Resp 16 | Ht 66.0 in | Wt 284.0 lb

## 2010-12-10 DIAGNOSIS — E1165 Type 2 diabetes mellitus with hyperglycemia: Secondary | ICD-10-CM

## 2010-12-10 MED ORDER — LINAGLIPTIN 5 MG PO TABS: 1.0000 | ORAL_TABLET | Freq: Every day | ORAL | Status: DC

## 2010-12-10 NOTE — Patient Instructions (Addendum)
Try Valerian root for sleep  [email or web address]

## 2010-12-10 NOTE — Progress Notes (Signed)
  Subjective:    Patient ID: Julia Estrada, female    DOB: August 21, 1970, 41 y.o.   MRN: [id number]  HPI  The patient presents for a follow-up of  chronic hypertension, chronic dyslipidemia, type 2 diabetes controlled with medicines    Review of Systems  HENT: Positive for congestion.   Respiratory: Negative for shortness of breath.   Gastrointestinal: Negative for abdominal pain.  Skin: Negative for pallor.  Psychiatric/Behavioral: Positive for sleep disturbance. Negative for suicidal ideas, hallucinations, behavioral problems and dysphoric mood. The patient is not nervous/anxious.        Objective:   Physical Exam  Constitutional: She appears well-developed and well-nourished. No distress.       Obese  HENT:  Head: Normocephalic.  Right Ear: External ear normal.  Left Ear: External ear normal.  Nose: Nose normal.  Mouth/Throat: Oropharynx is clear and moist.  Eyes: Conjunctivae are normal. Pupils are equal, round, and reactive to light. Right eye exhibits no discharge. Left eye exhibits no discharge.  Neck: Normal range of motion. Neck supple. No JVD present. No tracheal deviation present. No thyromegaly present.  Cardiovascular: Normal rate, regular rhythm and normal heart sounds.   Pulmonary/Chest: No stridor. No respiratory distress. She has no wheezes.  Abdominal: Soft. Bowel sounds are normal. She exhibits no distension and no mass. There is no tenderness. There is no rebound and no guarding.  Musculoskeletal: She exhibits no edema and no tenderness.  Lymphadenopathy:    She has no cervical adenopathy.  Neurological: She displays normal reflexes. No cranial nerve deficit. She exhibits normal muscle tone. Coordination normal.  Skin: No rash noted. No erythema.  Psychiatric: She has a normal mood and affect. Her behavior is normal. Judgment and thought content normal.       Not depressed          Assessment & Plan:

## 2010-12-14 ENCOUNTER — Other Ambulatory Visit (INDEPENDENT_AMBULATORY_CARE_PROVIDER_SITE_OTHER): Payer: 59

## 2010-12-14 DIAGNOSIS — E1165 Type 2 diabetes mellitus with hyperglycemia: Secondary | ICD-10-CM

## 2010-12-14 LAB — LIPID PANEL
Cholesterol: 165 mg/dL (ref 0–200)
Total CHOL/HDL Ratio: 4
Triglycerides: 142 mg/dL (ref 0.0–149.0)

## 2010-12-14 LAB — URINALYSIS
Ketones, ur: NEGATIVE
Leukocytes, UA: NEGATIVE
Specific Gravity, Urine: 1.025 (ref 1.000–1.030)
Urobilinogen, UA: 1 (ref 0.0–1.0)

## 2010-12-14 NOTE — Op Note (Signed)
Julia Estrada, Julia Estrada                  ACCOUNT NO.:  [id number]   MEDICAL RECORD NO.:  [id number]          PATIENT TYPE:  AMB   LOCATION:  SDC                           FACILITY:  [facility]   PHYSICIAN:  James A. Ashley Royalty, M.D.DATE OF BIRTH:  1970-12-21   DATE OF PROCEDURE:  12/19/2006  DATE OF DISCHARGE:                               OPERATIVE REPORT   PREOPERATIVE DIAGNOSES:  1. Fibroid uterus with apparent submucosal component.  2. Abnormal uterine bleeding - possibly secondary to #1.   POSTOPERATIVE DIAGNOSES:  1. Fibroid uterus with apparent submucosal component.  2. Abnormal uterine bleeding - possibly secondary to #1.  3. Pathology pending.   PROCEDURE:  1. Diagnostic hysteroscopy.  2. Dilatation and curettage.   SURGEON:  Rudy Jew. Ashley Royalty, M.D.   ANESTHESIA:  Spinal with a paracervical block (1% Xylocaine 20 mL  followed by an additional 10 mL later).   FINDINGS:  Uterus noted to be anteflexed and approximately 10.5 cm in  depth.   ESTIMATED BLOOD LOSS:  Less than 25 mL.   COMPLICATIONS:  None.   PACKS AND DRAINS:  None.   PROCEDURE:  The patient was taken to the operating room and placed in  the sitting position.  After spinal anesthetic was administered, she was  placed in the dorsal supine position.  The spinal anesthetic was given  in deference to the patient's history of sleep apnea.  The patient was  prepped and draped in usual manner for vaginal surgery.  Posterior  weighted retractor was placed per vagina.  Sims retractor was placed  anteriorly.  The anterior lip of cervix was grasped with a single-tooth  tenaculum.  The uterus was gently sounded to approximately 10.5 cm and  noted to be severely anteverted to anteflexed.  The cervix was then  dilated to a size 27-French using Shawnie Pons dilators.  Resectoscope was  placed into the uterine cavity using sorbitol as a distension medium.  The uterine cavity was difficult to visualize, owing to the severe  anteversion  to anteflexion.  The tubal ostia were identified.  Appropriate photos were obtained.  As additional imaging was  accomplished, it appeared that there was a rather large fibroid  encroaching on the anterior aspect of the endometrial cavity.  It was  described as 1.2 cm in diameter on the ultrasound, and it appeared to be  larger than this size as viewed hysteroscopically.  It occurred to the  operator that the appearance of the severe anteversion to the  anteflexion could also be secondary to a very large anterior fibroid  that created the appearance of this anteversion to anteflexion which  would not otherwise be the case, if it were not for the presence of a  large anterior fibroid which currently encroached on the endometrial  cavity.  In the final analysis, it was not felt to be prudent to attempt  to resect this fibroid, not knowing the true external contour of the  uterine cavity and whether such resection would create an undue risk of  perforation with potential injury to surrounding abdominal organs.  In  addition, as it turned out, the spinal anesthetic was suboptimal, and in  order to accomplish any additional resection, the patient would have to  be placed under general anesthesia which opposed additional risk to her  based on the history of sleep apnea and her previous responses to  general anesthesia.  Hence, the decision was made to avoid any  resection.   Attention was then turned to the uterine curettage.  After an initial 20  mL of 1% Xylocaine was instilled circumferentially to create a  paracervical block to augment the spinal, an additional 10 mL was placed  in order to accomplish the curettage.  Medium-size curette was  introduced into the uterine cavity.  A four-quadrant curettage was  performed, and a gentle therapeutic curettage was performed.  As the  patient was somewhat uncomfortable during this portion of the procedure,  the procedure was terminated.  A small  cervical laceration in the right  aspect of the patient's cervix was easily closed with a 2-0 Vicryl  interrupted suture.  Hemostasis was noted and the procedure terminated.   The patient tolerated the procedure extremely well and was returned to  the recovery room in good condition.  She is to return to the office in  approximately 2 weeks for her postoperative appointment, discussion of  the results and the opportunity to formulate a subsequent plan.      James A. Ashley Royalty, M.D.  Electronically Signed     JAM/MEDQ  D:  12/19/2006  T:  12/19/2006  Job:  [id number]

## 2010-12-14 NOTE — Assessment & Plan Note (Signed)
[facility]                         GASTROENTEROLOGY OFFICE NOTE   NAME:Scotto, LAKEITHA BASQUES                         MRN:          [id number]  DATE:12/27/2006                            DOB:          [birth year]    PROBLEM:  Globus.   REASON:  Ms. Wigger still is complaining of a sensation of fullness in her  anterior neck with swallowing.  A 24-hour pH testing was negative for  significant acid reflux.  She was evaluated by Dr. Everardo All for a thyroid  nodule and it was his opinion that the nodule was not causing symptoms  either.  The nodule measures 8 mm.   HPI:  Upper endoscopy was entirely normal.   PHYSICAL EXAMINATION:  Pulse 60, blood pressure 130/84, weight 287.   IMPRESSION:  Globus hystericus.  At this point it does not appear reason  for her symptoms, raising the question of functional disorder.   RECOMMENDATION:  A barium swallow.  If this is negative I will refer her  to ENT for further evaluation before assuming this diagnosis.     Barbette Hair. Arlyce Dice, MD,FACG  Electronically Signed    RDK/MedQ  DD: 12/27/2006  DT: 12/27/2006  Job #: [id number]

## 2010-12-14 NOTE — Assessment & Plan Note (Signed)
[facility]   NAME:  HILLIARY, JOCK                  ACCOUNT NO.:  [id number]   MEDICAL RECORD NO.:  [id number]      DATE OF BIRTH:  [birth year]   PHYSICIAN:  Barbette Hair. Arlyce Dice, MD,FACG VISIT DATE:  12/07/2006                                   OFFICE VISIT   A 48-hour Bravo pH study using the Bravo pH Monitoring System.   On day 1, there were 12 reflux episodes, 8 supine, 8 postprandially, and  4 in the upright position.  Fraction of the time below pH 4 was a total  of 0.4.  DeMeester score was 2.4 with normal less than 14.72.   On day 2, there was only one episode of reflux that lasted less than 5  minutes.  DeMeester score was 0.5.   The study did not demonstrate significant acid reflux.      Barbette Hair. Arlyce Dice, MD,FACG  Electronically Signed     RDK/MEDQ  D:  12/07/2006  T:  12/07/2006  Job:  [id number]

## 2010-12-14 NOTE — Consult Note (Signed)
[facility]                          ENDOCRINOLOGY CONSULTATION   NAME:Julia Estrada, Julia Estrada                         MRN:          [id number]  DATE:12/12/2006                            DOB:          Nov 01, 1970    REASON FOR REFERRAL:  Thyroid nodule.   HISTORY OF PRESENT ILLNESS:  A 40 year old woman with 1 year of  dysphagia.  She has associated productive cough and a sensation of  fullness at the anterior neck.   PAST MEDICAL HISTORY:  1. Asthma.  2. GERD.  3. Hyperglycemia.   SOCIAL HISTORY:  She is married.  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   FAMILY HISTORY:  Her sister has hyperthyroidism of an uncertain cause.   REVIEW OF SYSTEMS:  She denies any change in her weight.  She denies  shortness of breath.   PHYSICAL EXAMINATION:  VITAL SIGNS:  Blood pressure is 128/85, heart  rate is 59, temperature is 99.2.  The weight is 286.  GENERAL:  Obese, otherwise appears healthy.  SKIN:  No rash.  It is minimally diaphoretic.  HEENT:  No proptosis.  No periorbital swelling.  NECK:  I do not appreciate the thyroid nodule.  CHEST:  Clear to auscultation.  No respiratory distress.  CARDIOVASCULAR:  There is 1+ bilateral pretibial edema.  Regular rate  and rhythm.  No murmur.  Pedal pulse s are intact.  NEUROLOGIC:  Alert and oriented.  Gait is observed in the office to be  normal and there is no tremor.   LABORATORY STUDIES:  On Dec 12, 2006, TSH normal at 1.09.  Thyroid  ultrasound on October 30, 2006, shows a right upper pole thyroid nodule,  12 x 5 x 7 mm.  In 2005 a TSH was 0.06.   IMPRESSION:  1. One very small thyroid nodule, which is 8 mm in average diameter.  2. History of hyperthyroidism 3 years ago, which apparently resolved      without any therapy.  3. Symptom complex as noted above, including the difficulty      swallowing, which should be concluded from the thyroid ultrasound      that this symptom  is not thyroid-related.   PLAN:  1. We discussed the risks and natural history of thyroid nodular      disease.  I have told her that this nodule, given its 8 mm average      diameter, is a little small for biopsy at this point, but I have      asked her to return in 6 months.  2. I told her that she has a good chance of developing hyperthyroidism      again in the future, so she should have a TSH annually and p.r.n.     Sean A. Everardo All, MD  Electronically Signed    SAE/MedQ  DD: 12/12/2006  DT: 12/13/2006  Job #: [id number]   cc:   Georgina Quint. Plotnikov, MD

## 2010-12-14 NOTE — Assessment & Plan Note (Signed)
[facility]                             PULMONARY OFFICE NOTE   NAME:BELLMaat, Julia Estrada                         MRN:          [id number]  DATE:12/05/2006                            DOB:          06/05/1971    REFERRING PHYSICIAN:  Georgina Quint. Plotnikov, MD   I met [name] today for evaluation of her obstructive sleep apnea.   She had originally undergone an overnight polysomnogram in March 2002  which showed severe obstructive sleep apnea with an apnea hypopnea index  of 77 and an oxygen saturation nadir of 86%. She then had a CPAP  titration study in June 2002 and was titrated to a CPAP pressure of 9  cmH20.   From what I can tell, she has not had any further adjustments in her  pressure settings since then. She says her sleep pattern is that she  will typically go to bed between 11 and midnight. She falls asleep  fairly quickly. She uses a CPAP machine for the entire time that she is  asleep and she wakes up between 7 and 7:30 in the morning. She is not  aware of snoring anymore while she uses the machine but she does notice  quite a bit of different on the night that she forgets to use her CPAP.  She says that recently she has been having more prominent dreams and  that if she sleeps on her back, she has to sleep with her head propped  up to help with her breathing. She is currently using a nasal mask with  a heated humidification and does not appear to be having any difficulty  tolerating the use of the mask or the machine. She says that she is due  to have a change in her work schedule where she will be working the  third shift which will consist of working from 10:30 at night until 6:30  in the morning and this will be Monday through Friday. She denies any  symptoms of sleep hallucinations, sleep paralysis, or cataplexy. She  also denies any symptoms of restless leg syndrome. There is no history  of sleep walking, sleep talking or nightmares.  There is no history of  bruxism. She is not currently using anything to help her fall asleep at  night or stay awake during the day.   PAST MEDICAL HISTORY:  Significant for hypertension, allergies, sleep  apnea, asthma, reflux, and she is also being evaluated for a thyroid  nodule.   PAST SURGICAL HISTORY:  Significant for adenoidectomy in 1976,  tonsillectomy in 1992, myomectomy in 2002 and cesarean section in 2004.   CURRENT MEDICATIONS:  1. Birth control pills.  2. Iron supplement.  3. Aldactone 50 mg daily.  4. Valtrex 500 mg daily.  5. Zantac 300 mg q.h.s.  6. Zegerid 40 mg daily.  7. Fluticasone nasal spray as needed.  8. Claritin as needed.   ALLERGIES:  She has allergies to CODEINE, PERCOCET, MAXZIDE.   FAMILY HISTORY:  Significant for her mother who had hypertension,  cholesterol and diabetes as well as cancer  of her kidney. She has a  sister who has lupus and hypothyroidism and another sister who has  diabetes and hypertension.   SOCIAL HISTORY:  She is married, she has one child. There is no history  of tobacco or alcohol use. She works at [employer]   REVIEW OF SYSTEMS:  Essentially negative except for as stated above.   PHYSICAL EXAMINATION:  VITAL SIGNS:  She is 5 feet 6 inches tall, 284  pounds. Temperature is 98.4, blood pressure is 110/80, heart rate is 65,  oxygen saturation is 95% on room air.  HEENT:  Pupils reactive. She has prominent nasal turbinates. She has a  Mallampati 4 airway. There is no lymphadenopathy, no thyromegaly.  HEART:  S1, S2.  CHEST:  Clear to auscultation.  ABDOMEN:  Obese, soft, nontender.  EXTREMITIES:  No edema, cyanosis or clubbing.  NEUROLOGIC:  No focal deficits were appreciated.   IMPRESSION:  1. Severe obstructive sleep apnea. She is currently on CPAP at 9      cmH2O. The concern that I have is that she may be having worsening      of her symptoms. I will schedule her for an auto CPAP titration      study for  2 weeks to determine if she will need to have any further      adjustments in the pressure setting. If she is still having      difficulty after this, then she may need to have a repeat CPAP      titration study. In the meantime, I have discussed with her the      importance of diet, exercise and weight reduction.  2. Shift work. I have discussed with her various techniques to try and      improve her sleep hygiene as well as consolidate her sleep when she      has the change in her  work schedule, which again is due to be next      week.  3. Obesity. Again I discussed with her the importance of diet,      exercise and weight reduction.  4. Thyroid nodule. She is due to have evaluation by Dr. Everardo All for      this.   I will followup with her in approximately 2 months.     Coralyn Helling, MD  Electronically Signed    VS/MedQ  DD: 12/05/2006  DT: 12/05/2006  Job #: [id number]   cc:   Georgina Quint. Plotnikov, MD

## 2010-12-17 NOTE — H&P (Signed)
NAMELafonda, Julia Estrada 01/24/2003                   ACCOUNT NO.:  [id number]   MEDICAL RECORD NO.:  [id number]                   PATIENT TYPE:   LOCATION:                                       FACILITY:   PHYSICIAN:  Rudy Jew. Ashley Royalty, M.D.             DATE OF BIRTH:   DATE OF ADMISSION:  DATE OF DISCHARGE:                                HISTORY & PHYSICAL   HISTORY OF PRESENT ILLNESS:  This is a 40 year old gravida 4, para 0, AB 3,  EDC January 31, 2003, approximately 39 weeks by dates.  Prenatal care has been  complicated by in vitro fertilization pregnancy (through [facility]), positive  anticardiolipin antibodies, genital HSV, status post myomectomy.  The  patient has been maintained early on with heparin and baby aspirin for IV  protocol.  She was subsequently maintained on progesterone suppositories and  most recently on prenatal vitamins and iron.  She would like to have primary  C-section secondary to the myomectomy, though we have no definite  documentation of a cavity violation.   PAST MEDICAL HISTORY:  1. Genital herpes, as above.  2. Anemia.   PAST SURGICAL HISTORY:  Myomectomy.   ALLERGIES:  1. CODEINE.  2. PERCOCET.   FAMILY HISTORY:  Positive for diabetes, hypertension, and heart disease.   SOCIAL HISTORY:  The patient denies use of tobacco or alcohol.   REVIEW OF SYSTEMS:  Noncontributory.   PHYSICAL EXAMINATION:  GENERAL:  Well-developed, well-nourished, pleasant,  black female in no acute distress.  VITAL SIGNS:  Afebrile, vital signs stable.  SKIN:  Warm and dry without lesions.  LYMPH NODES:  No supraclavicular, cervical, or inguinal adenopathy.  HEENT:  Normocephalic.  NECK:  Supple without thyromegaly.  CHEST:  Lungs clear.  CARDIAC:  Regular rate and rhythm without murmurs, gallops, or rubs.  BREASTS:  Exam is deferred.  ABDOMEN:  Soft and nontender.  It is gravid with a term fundal height.  Fetal heart tones were auscultated.  MUSCULOSKELETAL:  Reveals  full range of motion without edema, cyanosis, or  CVA tenderness.  PELVIC:  Examination is deferred.   IMPRESSION:  1. Intrauterine pregnancy at 37 weeks' gestation.  2. In vitro pregnancy.  3. Status post myomectomy.  4. Genital herpes.  5. Positive anticardiolipin antibodies.  6. Desire for primary cesarean section secondary to history of myomectomy.   PLAN:  Primary low transverse cesarean section.  The risks, benefits,  complications, and alternatives were fully discussed with the patient.  She  states she understands and accepts.  Questions were invited and answered.                                                    James A. Ashley Royalty, M.D.    JAM/MEDQ  D:  01/23/2003  T:  01/23/2003  Job:  [id number]

## 2010-12-17 NOTE — Op Note (Signed)
NAME:  Julia Estrada, Julia Estrada                            ACCOUNT NO.:  [id number]   MEDICAL RECORD NO.:  [id number]                   PATIENT TYPE:  INP   LOCATION:  9148                                 FACILITY:  [facility]   PHYSICIAN:  James A. Ashley Royalty, M.D.             DATE OF BIRTH:  1971-02-19   DATE OF PROCEDURE:  01/24/2003  DATE OF DISCHARGE:                                 OPERATIVE REPORT   PREOPERATIVE DIAGNOSES:  1. Intrauterine pregnancy at 16 weeks' gestation.  2. Previous myomectomy.   POSTOPERATIVE DIAGNOSES:  1. Intrauterine pregnancy at 69 weeks' gestation.  2. Intrapartum hemorrhage from uterine incision.   PROCEDURE:  Primary low transverse cesarean section.   SURGEON:  Rudy Jew. Ashley Royalty, M.D.   ASSISTANT:  Georgina Peer, M.D.   ANESTHESIA:  Epidural.   ESTIMATED BLOOD LOSS:  2000 mL.   COMPLICATIONS:  Intrapartum hemorrhage from uterine incision.   PACKS AND DRAINS:  Foley.   Sponge, needle, and instrument count reported as correct x2.   PROCEDURE:  The patient was taken to the operating room and placed in the  sitting position.  Epidural anesthetic was administered.  She was then  placed in the dorsal supine position and the operator began prepping her.  At this point the anesthesia personnel stated that the epidural was not  functioning properly and needed to sit the patient back up and re-address  the epidural.  This was accomplished.  She was then placed in the dorsal  supine position once again.  The abdominal pannus was elevated with tape.  The operative field was prepped and draped in the usual manner for abdominal  surgery.  A Pfannenstiel incision was made superiorly to the old myomectomy  incision.  The subcutaneous tissues were sharply and bluntly dissected down  to the fascia.  Despite the patient's overall obesity, she was noted to have  only a modest amount of subcutaneous tissue.  The fascia was then nicked  with a knife and incised  transversely with Mayo scissors.  The underlying  rectus muscles were separated from the overlying fascia using sharp and  blunt dissection.  The peritoneum was identified, elevated with  hemostats,  and entered atraumatically with Metzenbaum scissors.  The incision was  extended longitudinally.  The uterus was identified and the bladder flap  created by incising the anterior uterine serosa and sharply and bluntly  dissecting the bladder inferiorly.  Next a low transverse incision was made  on the uterus.  Unfortunately, during the course of the incision large  venous bleeders were encountered which obscured the operative field  somewhat.  In addition, the uterine wall was not developed at all and was in  my estimation approximately one inch thick.  This resulted in unusually  large amount of blood loss prior to even reaching the amniotic cavity.  Once  the amniotic cavity was encountered, the incision was  extended with bandage  scissors appropriately.  The fluid was noted to be clear.  The infant was  delivered from a vertex presentation in an atraumatic manner.  The infant  was suctioned.  The cord was triply clamped, cut, and the infant given to  the waiting pediatrics team.  Arterial cord pH was obtained from the  isolated segment.  Regular cord blood was obtained and the placenta and  membranes removed in their entirety and submitted to pathology for  histologic studies.  An attempt was made to exteriorize the uterus.  However, there were too many adhesions present superiorly to accomplish this  easily.  Attention was turned to the more pressing need of obtaining  hemostasis and closing the primary uterine incision.  The incision was  closed in two running layers of #1 Vicryl.  The first was a running locking  layer.  The second was a running, intermittently locking and imbricating  layer.  Hemostasis was noted.  Copious irrigation was accomplished.  The  right adnexa was inspected and  found to be normal.  Attempt was made to  identify the left adnexa.  However, there were numerous filmy adhesions that  were felt best left alone in the context of the overall blood loss.  However, the area of the adnexa was not enlarged and the operator thought he  could feel at the area of the ovary and tube and there was no abnormality  palpated.  At this point the incision line was once again inspected and  found to be hemostatic.  The rectus muscles were then reapproximated in the  midline using 0 Vicryl.  The fascia was then closed with 0 Vicryl in a  running fashion.  The skin was closed with staples.  At the conclusion of  the procedure, the urine was clear and copious.   The patient has been taken to the recovery room in excellent condition.                                               James A. Ashley Royalty, M.D.    JAM/MEDQ  D:  01/24/2003  T:  01/26/2003  Job:  [id number]

## 2010-12-17 NOTE — Assessment & Plan Note (Signed)
[facility]                             PULMONARY OFFICE NOTE   NAME:Julia Estrada, Julia Estrada                         MRN:          [id number]  DATE:01/02/2007                            DOB:          Nov 10, 1970    I received the auto CPAP titration download from [name].   There are two things of note.  First is that it appeared that a CPAP  pressure setting of 11 cm of water would be her optimal pressure  setting, and therefore, will increase the pressure setting to this.   Additionally, she appears to have continued erratic sleep schedule,  which may be contributing to some of her symptoms as well.   I will follow up with her in the office in the next couple of weeks to  further assess whether she has gained symptomatic improvement from the  increase in her CPAP pressure setting.     Coralyn Helling, MD  Electronically Signed    VS/MedQ  DD: 01/02/2007  DT: 01/02/2007  Job #: [id number]   cc:   Georgina Quint. Plotnikov, MD

## 2010-12-17 NOTE — Discharge Summary (Signed)
NAME:  Julia Estrada, Julia Estrada                            ACCOUNT NO.:  [id number]   MEDICAL RECORD NO.:  [id number]                   PATIENT TYPE:  INP   LOCATION:  9148                                 FACILITY:  [facility]   PHYSICIAN:  James A. Ashley Royalty, M.D.             DATE OF BIRTH:  1970/12/30   DATE OF ADMISSION:  01/24/2003  DATE OF DISCHARGE:  01/28/2003                                 DISCHARGE SUMMARY   ADMISSION DIAGNOSES:  1. Intrauterine pregnancy at [redacted] weeks gestation.  2. History of previous myomectomy.   DISCHARGE DIAGNOSES:  1. Primary low transverse cesarean section.  2. Intrapartum hemorrhage from prior uterine incision.   PROCEDURES:  Primary low transverse cesarean section.   COURSE OF HOSPITALIZATION:  The patient was admitted to [facility] on  January 24, 2003.  The patient is a 40 year old G4 P0 AB3 with an EDC of January 31, 2003 at approximately [redacted] weeks gestation by dates.  Her prenatal care was  complicated by in vitro fertilization pregnancy, positive anticardiolipin  antibodies, genital HSV, and status post myomectomy.  The patient had been  maintained on heparin and baby aspirin for her IV protocol early in her  pregnancy.  She was also given progesterone suppositories to help support  the pregnancy, along with prenatal vitamins and iron.  The patient had  chosen elective C-section secondary to the myomectomy.  Upon admission it  was noted that her hemoglobin was 10.8 with a respective hematocrit of 33.4.  Other laboratory values are noted in the chart.  The patient was then take  to the OR where a primary cesarean section was performed.  A female infant  with Apgars of 9 and 9 was delivered at 8:30 a.m. without complications.  For operative note, please review the chart.  The patient was then  transferred to post anesthesia care unit where her recovery went  uncomplicated and she was transferred to the mother-baby unit.  On June 26  it was noted that her vital  signs were stable, her abdominal incision was  okay.  Her hemoglobin at this time was 8.0 with a respective hematocrit of  24.4.  On June 27, her postpartum day #2 was uncomplicated.  Vital signs  were stable.  The patient was ambulating.  On June 28 vital signs were  stable, lungs were clear, incision was intact and dry.  Hemoglobin at this  time was 7.2 with a hematocrit of 22.4.  On January 28, 2003 vital signs were  stable, lungs were clear, incision was intact.  Hemoglobin was 7.2 which was  stable.  The patient was discharged home at this time in stable condition  with a discharge diagnosis of status post cesarean section and anemia.   DISCHARGE INSTRUCTIONS:  Routine.   DISCHARGE MEDICATIONS:  1. Chromagen.  2. Vicodin one p.o. q.4-6h. p.r.n. pain.   DISCHARGE FOLLOW-UP:  The patient is  to return to the office in three to  four days for incision check.     TC/MEDQ  D:  04/16/2003  T:  04/16/2003  Job:  [id number]

## 2010-12-17 NOTE — Op Note (Signed)
[facility]  Patient:    Julia Estrada, Julia Estrada                         MRN: [id number] Proc. Date: 03/03/99 Adm. Date:  [id number] Disc. Date: [id number] Attending:  Wandalee Ferdinand                           Operative Report  PREOPERATIVE DIAGNOSES:       1. Intrauterine pregnancy at 6-1/[redacted] weeks                                  gestation.                               2. Twin gestation.                               3. Status post in vitro fertilization.                               4. Incomplete abortion.  POSTOPERATIVE DIAGNOSES:      1. Intrauterine pregnancy at 6-1/[redacted] weeks                                  gestation.                               2. Twin gestation.                               3. Status post in vitro fertilization.                               4. Incomplete abortion, pathology pending.  PROCEDURE:                    Suction dilatation and curettage.  SURGEON:                      Rudy Jew. Ashley Royalty, M.D.  ANESTHESIA:                   Monitored anesthesia care with 1% Xylocaine paracervical block (12 cc).  FINDINGS:                     The uterus sounded to 9 cm.  Moderate amount of products of conception was obtained.  ESTIMATED BLOOD LOSS:         50 cc.  COMPLICATIONS:                None.  PACKS AND DRAINS:             None.  DESCRIPTION OF PROCEDURE:     The patient was taken to the operating room and placed in the dorsal supine position.  After adequate IV sedation was administered, she was placed in the lithotomy position, prepped and draped in the usual manner for vaginal surgery.  A posterior weighted retractor was placed.  The anterior lip of the cervix was grasped with a single-tooth tenaculum.  The uterus was gently sounded to 9 cm.  The cervix was noted to be already dilated to greater than size #30 [place].  A 10 mm suction curet was then placed into the uterine cavity and suction applied.  A moderate amount  of apparent products of conception was delivered through the tubing.  After several passes with the suction curet, no additional tissue was obtained.  At this point, the vaginal instruments were removed.  Hemostasis was noted and the procedure was terminated.  The patient was taken to the recovery room in excellent condition. DD:  03/08/01 TD:  03/09/01 Job: [id number]

## 2010-12-17 NOTE — Assessment & Plan Note (Signed)
[facility]                         GASTROENTEROLOGY OFFICE NOTE   NAME:Julia Estrada, Julia Estrada                         MRN:          [id number]  DATE:11/08/2006                            DOB:          [birth year]    PROBLEM:  Lump in throat.   Mrs. Pilant is a 40 year old African-American female, referred through the  courtesy of Dr. Posey Rea for evaluation.  She has a sensation of a  globus characterized by a constant fullness in the back of her throat.  She feels there is a lump in this area causing her to swallow all the  time.  She denies dysphagia per se.  She takes both Zegerid q. a.m. and  ranitidine nightly without improvement.  She has chronic hoarseness and  intermittent cough.  She denies sore throat.  She was seen by an  allergist who placed on her on these medicines on the assumption that  this is reflux.   PAST MEDICAL HISTORY:  Pertinent for hypertension and asthma.  She has  sleep apnea and is on CPAP.  She is status post tonsillectomy,  myomectomy, and C-section.   FAMILY HISTORY:  Pertinent for mother with kidney cancer.   MEDICATIONS:  1. Oral contraceptive.  2. Spironolactone.  3. Valtrex.  4. Ranitidine.  5. Zegerid.  6. Fluticasone.   SHE IS ALLERGIC TO CODEINE, PERCOCET, AND MAXZIDE.   She neither smokes nor drinks.  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   REVIEW OF SYSTEMS:  Positive for changes in her voice.   PHYSICAL EXAMINATION:  Pulse 58, blood pressure 130/82, weight 281.  HEENT: EOMI. PERRLA. Sclerae are anicteric.  Conjunctivae are pink.  NECK:  Supple without thyromegaly, adenopathy or carotid bruits.  CHEST:  Clear to auscultation and percussion without adventitious  sounds.  CARDIAC:  Regular rhythm; normal S1 S2.  There are no murmurs, gallops  or rubs.  ABDOMEN:  Bowel sounds are normoactive.  Abdomen is soft, non-tender and  non-distended.  There are no abdominal masses, tenderness, splenic  enlargement or hepatomegaly.  EXTREMITIES:  Full range of motion.  No cyanosis, clubbing or edema.  RECTAL:  Deferred.   IMPRESSION:  Persistent globus with chronic hoarseness and cough.  This  certainly raises the question of acid reflux despite medicines.  This  may also be due to chronic sinus drip.   RECOMMENDATION:  Upper endoscopy.  If this test is normal, I will place  pH probes to do a 48-hour pH study while on her medicines.     Barbette Hair. Arlyce Dice, MD,FACG  Electronically Signed    RDK/MedQ  DD: 11/08/2006  DT: 11/08/2006  Job #: [id number]

## 2010-12-17 NOTE — H&P (Signed)
[facility]  Patient:    Julia Estrada, Julia Estrada                         MRN: [id number] Adm. Date:  [id number] Attending:  Wandalee Ferdinand                         History and Physical  HISTORY OF PRESENT ILLNESS:   The patient is a 40 year old, gravida 2, para 0, AB 1.  Last menstrual period January 19, 2001.  Estimated date of confinement October 26, 2001.  The patient has undergone in vitro fertilization through [facility].  She had a known twin pregnancy.  Current gestational age is approximately 6-1/2 weeks.  The patient had an ultrasound on March 05, 2001, which revealed a viable twin gestation with a subchorionic hemorrhage.  Since then she has had increasing vaginal bleeding and had a repeat ultrasound today which revealed no evidence of any viable fetus or any cardiac activity whatsoever.  Examination in the office also revealed the cervical os to be open and tissue presenting in the cervical os, which was removed and submitted to pathology for histologic studies.  The patient is therefore for suction dilatation and curettage for incomplete AB.  MEDICATIONS:                  Vitamins.  PAST MEDICAL HISTORY:         Anemia.  PAST SURGICAL HISTORY:        Myomectomy.  ALLERGIES:                    PERCOCET and CODEINE.  SOCIAL HISTORY:               Noncontributory.  REVIEW OF SYSTEMS:            Noncontributory.  PHYSICAL EXAMINATION:         A well-developed, well-nourished, pleasant, white female in no acute distress.  VITAL SIGNS:                  Afebrile.  Vital signs stable.  SKIN:                         Warm and dry without lesions.  LYMPHATICS:                   There is no supraclavicular, cervical, or inguinal adenopathy.  HEENT:                        Normocephalic.  NECK:                         Supple without thyromegaly.  CHEST:                        The lungs are clear.  CARDIAC:                      Regular rate and rhythm  without murmurs, rubs, or gallops.  BREASTS:                      Exam deferred.  ABDOMEN:                      Soft and nontender without  masses or organomegaly.  Bowel sounds are active.  MUSCULOSKELETAL:              Full range of motion without edema, cyanosis, or CVA tenderness.  PELVIC:                       Exam is deferred to examination under anesthesia.  IMPRESSION:                   1. Incomplete abortion at 6-1/[redacted] weeks gestation.                               2. Status post in vitro fertilization.                               3. Twin gestation.  PLAN:                         Suction dilatation and curettage.  The risks, benefits, complications, and alternatives were fully discussed with the patient.  She states that she understands and accepts.  Questions were provided and answered. DD:  03/08/01 TD:  03/08/01 Job: [id number]

## 2011-03-03 ENCOUNTER — Emergency Department (HOSPITAL_COMMUNITY)
Admission: EM | Admit: 2011-03-03 | Discharge: 2011-03-03 | Disposition: A | Discharge status: Home / Self Care | Payer: 59 | Attending: Emergency Medicine | Admitting: Emergency Medicine

## 2011-03-03 DIAGNOSIS — R51 Headache: Secondary | ICD-10-CM | POA: Insufficient documentation

## 2011-03-03 DIAGNOSIS — E119 Type 2 diabetes mellitus without complications: Secondary | ICD-10-CM | POA: Insufficient documentation

## 2011-03-03 DIAGNOSIS — K219 Gastro-esophageal reflux disease without esophagitis: Secondary | ICD-10-CM | POA: Insufficient documentation

## 2011-03-03 DIAGNOSIS — R109 Unspecified abdominal pain: Secondary | ICD-10-CM | POA: Insufficient documentation

## 2011-03-03 DIAGNOSIS — G4730 Sleep apnea, unspecified: Secondary | ICD-10-CM | POA: Insufficient documentation

## 2011-03-03 DIAGNOSIS — I10 Essential (primary) hypertension: Secondary | ICD-10-CM | POA: Insufficient documentation

## 2011-03-03 LAB — DIFFERENTIAL
Basophils Relative: 0 % (ref 0–1)
Monocytes Relative: 6 % (ref 3–12)
Neutro Abs: 6.8 10*3/uL (ref 1.7–7.7)
Neutrophils Relative %: 71 % (ref 43–77)

## 2011-03-03 LAB — CBC
Hemoglobin: 9.8 g/dL — ABNORMAL LOW (ref 12.0–15.0)
MCH: 23.2 pg — ABNORMAL LOW (ref 26.0–34.0)
MCV: 72 fL — ABNORMAL LOW (ref 78.0–100.0)
Platelets: 386 10*3/uL (ref 150–400)
RBC: 4.22 MIL/uL (ref 3.87–5.11)
WBC: 9.6 10*3/uL (ref 4.0–10.5)

## 2011-03-03 LAB — COMPREHENSIVE METABOLIC PANEL
ALT: 18 U/L (ref 0–35)
AST: 9 U/L (ref 0–37)
Albumin: 3.1 g/dL — ABNORMAL LOW (ref 3.5–5.2)
Alkaline Phosphatase: 109 U/L (ref 39–117)
BUN: 10 mg/dL (ref 6–23)
Chloride: 98 mEq/L (ref 96–112)
Potassium: 3.7 mEq/L (ref 3.5–5.1)
Sodium: 133 mEq/L — ABNORMAL LOW (ref 135–145)
Total Bilirubin: 0.3 mg/dL (ref 0.3–1.2)
Total Protein: 7.6 g/dL (ref 6.0–8.3)

## 2011-03-03 LAB — URINALYSIS, ROUTINE W REFLEX MICROSCOPIC
Bilirubin Urine: NEGATIVE
Nitrite: NEGATIVE
Specific Gravity, Urine: 1.021 (ref 1.005–1.030)
Urobilinogen, UA: 1 mg/dL (ref 0.0–1.0)
pH: 6 (ref 5.0–8.0)

## 2011-03-03 LAB — POCT PREGNANCY, URINE: Preg Test, Ur: NEGATIVE

## 2011-03-22 ENCOUNTER — Emergency Department (HOSPITAL_COMMUNITY)
Admission: EM | Admit: 2011-03-22 | Discharge: 2011-03-23 | Disposition: A | Discharge status: Home / Self Care | Payer: 59 | Attending: Emergency Medicine | Admitting: Emergency Medicine

## 2011-03-22 ENCOUNTER — Emergency Department (HOSPITAL_COMMUNITY): Payer: 59

## 2011-03-22 DIAGNOSIS — I10 Essential (primary) hypertension: Secondary | ICD-10-CM | POA: Insufficient documentation

## 2011-03-22 DIAGNOSIS — K219 Gastro-esophageal reflux disease without esophagitis: Secondary | ICD-10-CM | POA: Insufficient documentation

## 2011-03-22 DIAGNOSIS — E119 Type 2 diabetes mellitus without complications: Secondary | ICD-10-CM | POA: Insufficient documentation

## 2011-03-22 DIAGNOSIS — Z79899 Other long term (current) drug therapy: Secondary | ICD-10-CM | POA: Insufficient documentation

## 2011-03-22 DIAGNOSIS — E669 Obesity, unspecified: Secondary | ICD-10-CM | POA: Insufficient documentation

## 2011-03-22 DIAGNOSIS — G4730 Sleep apnea, unspecified: Secondary | ICD-10-CM | POA: Insufficient documentation

## 2011-03-22 DIAGNOSIS — R079 Chest pain, unspecified: Secondary | ICD-10-CM | POA: Insufficient documentation

## 2011-03-22 DIAGNOSIS — D649 Anemia, unspecified: Secondary | ICD-10-CM | POA: Insufficient documentation

## 2011-03-22 LAB — DIFFERENTIAL
Eosinophils Absolute: 0.1 10*3/uL (ref 0.0–0.7)
Eosinophils Relative: 1 % (ref 0–5)
Lymphocytes Relative: 26 % (ref 12–46)
Lymphs Abs: 2.4 10*3/uL (ref 0.7–4.0)
Monocytes Absolute: 0.6 10*3/uL (ref 0.1–1.0)
Monocytes Relative: 7 % (ref 3–12)

## 2011-03-22 LAB — POCT I-STAT, CHEM 8
Calcium, Ion: 1.11 mmol/L — ABNORMAL LOW (ref 1.12–1.32)
Chloride: 102 mEq/L (ref 96–112)
Glucose, Bld: 99 mg/dL (ref 70–99)
HCT: 31 % — ABNORMAL LOW (ref 36.0–46.0)
TCO2: 25 mmol/L (ref 0–100)

## 2011-03-22 LAB — URINALYSIS, ROUTINE W REFLEX MICROSCOPIC
Bilirubin Urine: NEGATIVE
Glucose, UA: NEGATIVE mg/dL
Ketones, ur: NEGATIVE mg/dL
Nitrite: NEGATIVE
Protein, ur: NEGATIVE mg/dL
pH: 6.5 (ref 5.0–8.0)

## 2011-03-22 LAB — CBC
HCT: 29.4 % — ABNORMAL LOW (ref 36.0–46.0)
MCH: 23.3 pg — ABNORMAL LOW (ref 26.0–34.0)
MCV: 70.5 fL — ABNORMAL LOW (ref 78.0–100.0)
Platelets: 333 10*3/uL (ref 150–400)
RDW: 16.6 % — ABNORMAL HIGH (ref 11.5–15.5)
WBC: 9.1 10*3/uL (ref 4.0–10.5)

## 2011-03-22 LAB — URINE MICROSCOPIC-ADD ON

## 2011-03-22 LAB — PRO B NATRIURETIC PEPTIDE: Pro B Natriuretic peptide (BNP): 142.4 pg/mL — ABNORMAL HIGH (ref 0–125)

## 2011-03-22 LAB — POCT I-STAT TROPONIN I

## 2011-03-22 LAB — D-DIMER, QUANTITATIVE: D-Dimer, Quant: 0.55 ug/mL-FEU — ABNORMAL HIGH (ref 0.00–0.48)

## 2011-03-23 ENCOUNTER — Emergency Department (HOSPITAL_COMMUNITY): Payer: 59

## 2011-03-23 LAB — URINE CULTURE

## 2011-03-23 MED ORDER — IOHEXOL 300 MG/ML  SOLN
100.0000 mL | Freq: Once | INTRAMUSCULAR | Status: AC | PRN
  Administered 2011-03-23: 100 mL via INTRAVENOUS

## 2011-03-24 ENCOUNTER — Telehealth: Payer: Self-pay | Admitting: *Deleted

## 2011-03-24 NOTE — Telephone Encounter (Signed)
Rec fax from [place], PharmD requesting new rx for testing supplies for the new meter she gave pt. That is True Result strips and lancets.   Also, pt was recently prescribed Tradjenta. This med is not covered. She can have Januvia filled for $0 copay through the link to wellness program. Please advise ok to change to Januvia.

## 2011-03-25 MED ORDER — SITAGLIP PHOS-METFORMIN HCL ER 50-1000 MG PO TB24: 1.0000 | ORAL_TABLET | Freq: Two times a day (BID) | ORAL | Status: DC

## 2011-03-25 NOTE — Telephone Encounter (Signed)
Ok Janumet (done) OK meter and supplies Thx

## 2011-03-29 MED ORDER — AUTO-LANCETS MISC: 1.0000 | Freq: Every day | Status: DC

## 2011-03-29 MED ORDER — BLOOD GLUCOSE TEST VI STRP: 1.0000 | ORAL_STRIP | Freq: Every day | Status: DC

## 2011-03-29 NOTE — Telephone Encounter (Signed)
I spoke to pt. She cant not afford $60 copay for Janumet. She can have JANUVIA for free. Is this ok? Please advise.

## 2011-03-29 NOTE — Telephone Encounter (Signed)
I spoke to pharmacy today and the Janumet will still cost pt $60.  I called pt to see if this is ok. No answer. Left mess for patient to call back.

## 2011-03-29 NOTE — Telephone Encounter (Signed)
Yes. Take Metformin separately then Thx

## 2011-03-31 MED ORDER — SITAGLIPTIN PHOSPHATE 100 MG PO TABS: 100.0000 mg | ORAL_TABLET | Freq: Every day | ORAL | Status: DC

## 2011-03-31 MED ORDER — METFORMIN HCL ER 500 MG PO TB24: 1000.0000 mg | ORAL_TABLET | Freq: Two times a day (BID) | ORAL | Status: DC

## 2011-03-31 NOTE — Telephone Encounter (Signed)
Pt & pharmacy informed of below.

## 2011-04-05 ENCOUNTER — Other Ambulatory Visit (INDEPENDENT_AMBULATORY_CARE_PROVIDER_SITE_OTHER): Payer: 59

## 2011-04-05 DIAGNOSIS — E1165 Type 2 diabetes mellitus with hyperglycemia: Secondary | ICD-10-CM

## 2011-04-05 LAB — COMPREHENSIVE METABOLIC PANEL
ALT: 22 U/L (ref 0–35)
AST: 17 U/L (ref 0–37)
CO2: 27 mEq/L (ref 19–32)
Chloride: 103 mEq/L (ref 96–112)
GFR: 123.25 mL/min (ref >60.00)
Potassium: 4.2 mEq/L (ref 3.5–5.1)
Total Protein: 7.3 g/dL (ref 6.0–8.3)

## 2011-04-05 LAB — HEMOGLOBIN A1C: Hgb A1c MFr Bld: 7.4 % — ABNORMAL HIGH (ref 4.6–6.5)

## 2011-04-12 ENCOUNTER — Encounter: Payer: Self-pay | Admitting: Internal Medicine

## 2011-04-12 ENCOUNTER — Ambulatory Visit (INDEPENDENT_AMBULATORY_CARE_PROVIDER_SITE_OTHER): Payer: 59 | Admitting: Internal Medicine

## 2011-04-12 VITALS — BP 130/84 | HR 85 | Temp 98.2°F | Resp 16 | Wt 292.0 lb

## 2011-04-12 DIAGNOSIS — E669 Obesity, unspecified: Secondary | ICD-10-CM

## 2011-04-12 DIAGNOSIS — K219 Gastro-esophageal reflux disease without esophagitis: Secondary | ICD-10-CM

## 2011-04-12 DIAGNOSIS — E119 Type 2 diabetes mellitus without complications: Secondary | ICD-10-CM

## 2011-04-12 DIAGNOSIS — D649 Anemia, unspecified: Secondary | ICD-10-CM

## 2011-04-12 DIAGNOSIS — E538 Deficiency of other specified B group vitamins: Secondary | ICD-10-CM

## 2011-04-12 DIAGNOSIS — D509 Iron deficiency anemia, unspecified: Secondary | ICD-10-CM

## 2011-04-12 DIAGNOSIS — R1012 Left upper quadrant pain: Secondary | ICD-10-CM

## 2011-04-12 DIAGNOSIS — Z23 Encounter for immunization: Secondary | ICD-10-CM

## 2011-04-12 MED ORDER — KETOCONAZOLE 2 % EX CREA: TOPICAL_CREAM | Freq: Every day | CUTANEOUS | Status: DC

## 2011-04-12 MED ORDER — RANITIDINE HCL 300 MG PO TABS: 300.0000 mg | ORAL_TABLET | Freq: Every day | ORAL | Status: DC

## 2011-04-12 NOTE — Assessment & Plan Note (Signed)
Continue with current prescription therapy as reflected on the Med list.  

## 2011-04-12 NOTE — Assessment & Plan Note (Signed)
Lap Band discussed

## 2011-04-12 NOTE — Assessment & Plan Note (Signed)
Tests reviewed Protonix in am, Zantac at hs Wt loss GI consult

## 2011-04-12 NOTE — Assessment & Plan Note (Signed)
Continue with current prescription therapy as reflected on the Med list. GI consult 

## 2011-04-12 NOTE — Assessment & Plan Note (Signed)
Continue with current prescription therapy as reflected on the Med list. GI consult

## 2011-04-12 NOTE — Progress Notes (Signed)
  Subjective:    Patient ID: Julia Estrada, female    DOB: September 11, 1970, 40 y.o.   MRN: [id number]  HPI  The patient presents for a follow-up of  Chronic anemia, hypertension, chronic dyslipidemia, type 2 diabetes controlled with medicines C/o LUQ abd pain spells at times - severe. Her [place] and HIDA were nl.     Review of Systems  Constitutional: Negative for chills, activity change, appetite change, fatigue and unexpected weight change.  HENT: Negative for congestion, mouth sores and sinus pressure.   Eyes: Negative for visual disturbance.  Respiratory: Negative for cough and chest tightness.   Gastrointestinal: Positive for nausea, vomiting and abdominal pain. Negative for blood in stool and abdominal distention.  Genitourinary: Negative for frequency, vaginal discharge, difficulty urinating and vaginal pain.  Musculoskeletal: Negative for back pain and gait problem.  Skin: Negative for pallor and rash.  Neurological: Negative for dizziness, tremors, weakness, numbness and headaches.  Psychiatric/Behavioral: Negative for confusion and sleep disturbance. The patient is nervous/anxious.        Objective:   Physical Exam  Constitutional: She appears well-developed. No distress.       Obese  HENT:  Head: Normocephalic.  Right Ear: External ear normal.  Left Ear: External ear normal.  Nose: Nose normal.  Mouth/Throat: Oropharynx is clear and moist.  Eyes: Conjunctivae are normal. Pupils are equal, round, and reactive to light. Right eye exhibits no discharge. Left eye exhibits no discharge.  Neck: Normal range of motion. Neck supple. No JVD present. No tracheal deviation present. No thyromegaly present.  Cardiovascular: Normal rate, regular rhythm and normal heart sounds.   Pulmonary/Chest: No stridor. No respiratory distress. She has no wheezes.  Abdominal: Soft. Bowel sounds are normal. She exhibits no distension and no mass. There is no tenderness. There is no rebound and no guarding.   Musculoskeletal: She exhibits no edema and no tenderness.  Lymphadenopathy:    She has no cervical adenopathy.  Neurological: She displays normal reflexes. No cranial nerve deficit. She exhibits normal muscle tone. Coordination normal.  Skin: No rash noted. No erythema.  Psychiatric: She has a normal mood and affect. Her behavior is normal. Judgment and thought content normal.   Lab Results  Component Value Date   WBC 9.1 03/22/2011   HGB 10.5* 03/22/2011   HCT 31.0* 03/22/2011   PLT 333 03/22/2011   CHOL 165 12/14/2010   TRIG 142.0 12/14/2010   HDL 43.20 12/14/2010   LDLDIRECT 92.4 09/29/2008   ALT 22 04/05/2011   AST 17 04/05/2011   NA 137 04/05/2011   K 4.2 04/05/2011   CL 103 04/05/2011   CREATININE 0.7 04/05/2011   BUN 11 04/05/2011   CO2 27 04/05/2011   TSH 0.87 09/29/2009   HGBA1C 7.4* 04/05/2011           Assessment & Plan:    R/o IBD, esoph strictures

## 2011-04-19 ENCOUNTER — Telehealth: Payer: Self-pay | Admitting: Gastroenterology

## 2011-04-20 NOTE — Telephone Encounter (Signed)
Pt scheduled to see Dr. Arlyce Dice 04/22/11@9 :30am. Left message for Dr. Loren Racer office to call back.  Stanton Kidney to notify pt of appt date and time.

## 2011-04-22 ENCOUNTER — Ambulatory Visit (INDEPENDENT_AMBULATORY_CARE_PROVIDER_SITE_OTHER): Payer: 59 | Admitting: Gastroenterology

## 2011-04-22 ENCOUNTER — Encounter: Payer: Self-pay | Admitting: Gastroenterology

## 2011-04-22 DIAGNOSIS — D509 Iron deficiency anemia, unspecified: Secondary | ICD-10-CM

## 2011-04-22 DIAGNOSIS — R1013 Epigastric pain: Secondary | ICD-10-CM

## 2011-04-22 LAB — POCT URINALYSIS DIP (DEVICE)
Bilirubin Urine: NEGATIVE
Glucose, UA: NEGATIVE
Nitrite: NEGATIVE
Specific Gravity, Urine: 1.02
pH: 6.5

## 2011-04-22 NOTE — Patient Instructions (Signed)
Your EGD is scheduled on 05/02/2011 at 3:30pm

## 2011-04-22 NOTE — Progress Notes (Signed)
History of Present Illness:  Julia Estrada is a pleasant 40 year old black female with history of iron deficiency anemia, B12 deficiency, diabetes and sleep apnea, referred at the request of Dr. Kennedy Bucker for evaluation of abdominal pain. She's been complaining of episodic episodes of moderately severe upper abdominal pain that radiates to her back. It is associated with excess belching. She denies nausea or vomiting.  She takes protonic and Zantac regularly. Recent abdominal ultrasound was negative. Hepatobiliary scan demonstrated a patent cystic duct and normal ejection fraction although CCK injection did produce some abdominal discomfort. She takes ibuprofen irregularly. She denies menorrhagia, melena or hematochezia.  In 2009 she underwent upper endoscopy with 48 hour bravo pH testing. Exam was normal and pH tests did not demonstrate significant acid reflux.    Review of Systems: She has intermittent episodes of diarrhea Pertinent positive and negative review of systems were noted in the above HPI section. All other review of systems were otherwise negative.    Current Medications, Allergies, Past Medical History, Past Surgical History, Family History and Social History were reviewed in [employer] [facility] electronic medical record  Vital signs were reviewed in today's medical record. Physical Exam: General: Well developed , well nourished, no acute distress Head: Normocephalic and atraumatic Eyes:  sclerae anicteric, EOMI Ears: Normal auditory acuity Mouth: No deformity or lesions Lungs: Clear throughout to auscultation Heart: Regular rate and rhythm; no murmurs, rubs or bruits Abdomen: Soft, non tender and non distended. No masses, hepatosplenomegaly or hernias noted. Normal Bowel sounds Rectal:deferred Musculoskeletal: Symmetrical with no gross deformities  Pulses:  Normal pulses noted Extremities: No clubbing, cyanosis, edema or deformities noted Neurological: Alert oriented x 4, grossly  nonfocal Psychological:  Alert and cooperative. Normal mood and affect

## 2011-04-22 NOTE — Assessment & Plan Note (Signed)
Combination of iron deficiency anemia and B12 deficiency raises the question of malabsorption.  Medications #1 since the patient is scheduled for upper endoscopy I will obtain small bowel biopsies to rule out celiac disease

## 2011-04-22 NOTE — Assessment & Plan Note (Addendum)
Despite a negative ultrasound I am suspicious that she is suffering from episodes of acute acalculous cholecystitis. It is noteworthy that CCK injection did produce some mild abdominal discomfort. At the same time nonulcer dyspepsia and gastroparesis should be ruled out.  Recommendations #1 upper endoscopy;  if negative I will obtain a gastric emptying scan #2 if the above tests are negative I will refer her for surgical consultation for consideration of a cholecystectomy

## 2011-05-02 ENCOUNTER — Encounter: Payer: Self-pay | Admitting: Gastroenterology

## 2011-05-02 ENCOUNTER — Other Ambulatory Visit: Payer: Self-pay | Admitting: Gastroenterology

## 2011-05-02 ENCOUNTER — Ambulatory Visit (AMBULATORY_SURGERY_CENTER): Payer: 59 | Admitting: Gastroenterology

## 2011-05-02 DIAGNOSIS — R1013 Epigastric pain: Secondary | ICD-10-CM

## 2011-05-02 DIAGNOSIS — D509 Iron deficiency anemia, unspecified: Secondary | ICD-10-CM

## 2011-05-02 LAB — GLUCOSE, CAPILLARY: Glucose-Capillary: 95 mg/dL (ref 70–99)

## 2011-05-02 MED ORDER — SODIUM CHLORIDE 0.9 % IV SOLN: 500.0000 mL | INTRAVENOUS | Status: DC

## 2011-05-03 ENCOUNTER — Telehealth: Payer: Self-pay | Admitting: *Deleted

## 2011-05-03 ENCOUNTER — Other Ambulatory Visit: Payer: Self-pay | Admitting: *Deleted

## 2011-05-03 MED ORDER — SPIRONOLACTONE 100 MG PO TABS: 100.0000 mg | ORAL_TABLET | Freq: Every day | ORAL | Status: DC

## 2011-05-03 NOTE — Telephone Encounter (Signed)
Message left on VM to call us if necessary.

## 2011-05-04 ENCOUNTER — Telehealth: Payer: Self-pay

## 2011-05-04 NOTE — Telephone Encounter (Signed)
Pt scheduled for [facility]ES for 05/25/11@[facility] . Pt to arrive at 9:45am, GES @10am . Pt to be NPO after midnight and to hold stomach meds for 24 hrs. Pt aware of appt date and time.

## 2011-05-25 ENCOUNTER — Encounter (HOSPITAL_COMMUNITY)
Admission: RE | Admit: 2011-05-25 | Discharge: 2011-05-25 | Disposition: A | Discharge status: Home / Self Care | Payer: 59 | Source: Ambulatory Visit | Attending: Gastroenterology | Admitting: Gastroenterology

## 2011-05-25 DIAGNOSIS — R109 Unspecified abdominal pain: Secondary | ICD-10-CM | POA: Insufficient documentation

## 2011-05-25 DIAGNOSIS — E119 Type 2 diabetes mellitus without complications: Secondary | ICD-10-CM | POA: Insufficient documentation

## 2011-05-25 MED ORDER — TECHNETIUM TC 99M SULFUR COLLOID
2.2000 | Freq: Once | INTRAVENOUS | Status: AC | PRN
  Administered 2011-05-25: 2.2 via INTRAVENOUS

## 2011-06-17 ENCOUNTER — Telehealth: Payer: Self-pay | Admitting: Gastroenterology

## 2011-06-17 NOTE — Telephone Encounter (Signed)
Left message for pt to call back  °

## 2011-06-20 NOTE — Telephone Encounter (Signed)
Pt requesting results of her GES scan. Pt informed the results were normal.

## 2011-06-30 ENCOUNTER — Telehealth: Payer: Self-pay | Admitting: *Deleted

## 2011-06-30 ENCOUNTER — Other Ambulatory Visit: Payer: Self-pay | Admitting: *Deleted

## 2011-06-30 ENCOUNTER — Ambulatory Visit (HOSPITAL_COMMUNITY)
Admission: RE | Admit: 2011-06-30 | Discharge: 2011-06-30 | Disposition: A | Discharge status: Home / Self Care | Payer: 59 | Source: Ambulatory Visit | Attending: Internal Medicine | Admitting: Internal Medicine

## 2011-06-30 ENCOUNTER — Telehealth: Payer: Self-pay | Admitting: Internal Medicine

## 2011-06-30 DIAGNOSIS — M79604 Pain in right leg: Secondary | ICD-10-CM

## 2011-06-30 DIAGNOSIS — M7989 Other specified soft tissue disorders: Secondary | ICD-10-CM

## 2011-06-30 DIAGNOSIS — M79609 Pain in unspecified limb: Secondary | ICD-10-CM | POA: Insufficient documentation

## 2011-06-30 NOTE — Telephone Encounter (Signed)
Julia Estrada, please, inform patient that her venous doppler was ok Thx

## 2011-06-30 NOTE — Telephone Encounter (Signed)
Order placed to be done at [facility] per patient. Pt informed.

## 2011-06-30 NOTE — Progress Notes (Signed)
*  PRELIMINARY RESULTS* Right lower venous doppler performed. Preliminary findings showed no obvious evidence of DVT, superficial thrombus or Bakers cyst.  Vinetta Bergamo 06/30/2011, 3:33 PM

## 2011-06-30 NOTE — Telephone Encounter (Signed)
Pt c/o pain in right leg that has become prominent in the calf today w/swelling. Request for order sent to her facility at [facility] for [place], or what you deem necessary [worried about blood clot]. Please advise.

## 2011-07-01 ENCOUNTER — Encounter: Payer: 59 | Admitting: *Deleted

## 2011-07-01 NOTE — Telephone Encounter (Signed)
Left detailed mess informing pt of below.  

## 2011-07-15 ENCOUNTER — Other Ambulatory Visit (INDEPENDENT_AMBULATORY_CARE_PROVIDER_SITE_OTHER): Payer: 59

## 2011-07-15 DIAGNOSIS — K219 Gastro-esophageal reflux disease without esophagitis: Secondary | ICD-10-CM

## 2011-07-15 DIAGNOSIS — D649 Anemia, unspecified: Secondary | ICD-10-CM

## 2011-07-15 DIAGNOSIS — E119 Type 2 diabetes mellitus without complications: Secondary | ICD-10-CM

## 2011-07-15 LAB — COMPREHENSIVE METABOLIC PANEL
ALT: 21 U/L (ref 0–35)
AST: 14 U/L (ref 0–37)
Calcium: 9.3 mg/dL (ref 8.4–10.5)
Chloride: 104 mEq/L (ref 96–112)
Creatinine, Ser: 0.6 mg/dL (ref 0.4–1.2)
Sodium: 140 mEq/L (ref 135–145)
Total Protein: 7.7 g/dL (ref 6.0–8.3)

## 2011-07-15 LAB — CBC WITH DIFFERENTIAL/PLATELET
Basophils Absolute: 0 10*3/uL (ref 0.0–0.1)
Eosinophils Absolute: 0.1 10*3/uL (ref 0.0–0.7)
HCT: 31.7 % — ABNORMAL LOW (ref 36.0–46.0)
Hemoglobin: 10.2 g/dL — ABNORMAL LOW (ref 12.0–15.0)
Lymphs Abs: 1.8 10*3/uL (ref 0.7–4.0)
MCHC: 32.2 g/dL (ref 30.0–36.0)
Neutro Abs: 5.4 10*3/uL (ref 1.4–7.7)
Platelets: 423 10*3/uL — ABNORMAL HIGH (ref 150.0–400.0)
RDW: 18.4 % — ABNORMAL HIGH (ref 11.5–14.6)

## 2011-07-19 ENCOUNTER — Ambulatory Visit (INDEPENDENT_AMBULATORY_CARE_PROVIDER_SITE_OTHER): Payer: 59 | Admitting: Internal Medicine

## 2011-07-19 ENCOUNTER — Encounter: Payer: Self-pay | Admitting: Internal Medicine

## 2011-07-19 VITALS — BP 128/88 | HR 76 | Temp 98.4°F | Resp 16 | Wt 282.0 lb

## 2011-07-19 DIAGNOSIS — D509 Iron deficiency anemia, unspecified: Secondary | ICD-10-CM

## 2011-07-19 DIAGNOSIS — F329 Major depressive disorder, single episode, unspecified: Secondary | ICD-10-CM

## 2011-07-19 DIAGNOSIS — E119 Type 2 diabetes mellitus without complications: Secondary | ICD-10-CM

## 2011-07-19 DIAGNOSIS — E559 Vitamin D deficiency, unspecified: Secondary | ICD-10-CM

## 2011-07-19 DIAGNOSIS — E538 Deficiency of other specified B group vitamins: Secondary | ICD-10-CM

## 2011-07-19 DIAGNOSIS — F3289 Other specified depressive episodes: Secondary | ICD-10-CM

## 2011-07-19 MED ORDER — VITAMIN D 1000 UNITS PO TABS: 1000.0000 [IU] | ORAL_TABLET | Freq: Every day | ORAL | Status: DC

## 2011-07-19 MED ORDER — VITAMIN B-12 1000 MCG SL SUBL: 1.0000 | SUBLINGUAL_TABLET | Freq: Every day | SUBLINGUAL | Status: DC

## 2011-07-19 MED ORDER — NIFEREX-150 FORTE 50-100 MG PO CAPS: 1.0000 | ORAL_CAPSULE | Freq: Every day | ORAL | Status: DC

## 2011-07-19 NOTE — Assessment & Plan Note (Signed)
Try Niferex forte IV iron discussed

## 2011-07-19 NOTE — Progress Notes (Signed)
  Subjective:    Patient ID: Julia Estrada, female    DOB: Dec 28, 1970, 40 y.o.   MRN: [id number]  HPI  The patient presents for a follow-up of  chronic hypertension, chronic dyslipidemia, type 2 diabetes controlled with medicines. Stress is better. F/u anemia    Review of Systems  Constitutional: Negative for chills, activity change, appetite change, fatigue and unexpected weight change.  HENT: Negative for congestion, mouth sores and sinus pressure.   Eyes: Negative for visual disturbance.  Respiratory: Negative for cough and chest tightness.   Gastrointestinal: Negative for nausea and abdominal pain.  Genitourinary: Negative for frequency, difficulty urinating and vaginal pain.  Musculoskeletal: Negative for back pain and gait problem.  Skin: Negative for pallor and rash.  Neurological: Negative for dizziness, tremors, weakness, numbness and headaches.  Psychiatric/Behavioral: Negative for suicidal ideas, confusion and sleep disturbance. The patient is not nervous/anxious.    Wt Readings from Last 3 Encounters:  07/19/11 282 lb (127.914 kg)  05/02/11 286 lb (129.729 kg)  04/22/11 286 lb (129.729 kg)   BP Readings from Last 3 Encounters:  07/19/11 128/88  05/02/11 142/89  04/22/11 120/82        Objective:   Physical Exam  Constitutional: She appears well-developed. No distress.       obese  HENT:  Head: Normocephalic.  Right Ear: External ear normal.  Left Ear: External ear normal.  Nose: Nose normal.  Mouth/Throat: Oropharynx is clear and moist.  Eyes: Conjunctivae are normal. Pupils are equal, round, and reactive to light. Right eye exhibits no discharge. Left eye exhibits no discharge.  Neck: Normal range of motion. Neck supple. No JVD present. No tracheal deviation present. No thyromegaly present.  Cardiovascular: Normal rate, regular rhythm and normal heart sounds.   Pulmonary/Chest: No stridor. No respiratory distress. She has no wheezes.  Abdominal: Soft. Bowel  sounds are normal. She exhibits no distension and no mass. There is no tenderness. There is no rebound and no guarding.  Musculoskeletal: She exhibits no edema and no tenderness.  Lymphadenopathy:    She has no cervical adenopathy.  Neurological: She displays normal reflexes. No cranial nerve deficit. She exhibits normal muscle tone. Coordination normal.  Skin: No rash noted. No erythema.  Psychiatric: She has a normal mood and affect. Her behavior is normal. Judgment and thought content normal.     Lab Results  Component Value Date   WBC 7.9 07/15/2011   HGB 10.2* 07/15/2011   HCT 31.7* 07/15/2011   PLT 423.0* 07/15/2011   GLUCOSE 108* 07/15/2011   CHOL 165 12/14/2010   TRIG 142.0 12/14/2010   HDL 43.20 12/14/2010   LDLDIRECT 92.4 09/29/2008   LDLCALC 93 12/14/2010   ALT 21 07/15/2011   AST 14 07/15/2011   NA 140 07/15/2011   K 4.0 07/15/2011   CL 104 07/15/2011   CREATININE 0.6 07/15/2011   BUN 11 07/15/2011   CO2 27 07/15/2011   TSH 1.07 07/15/2011   HGBA1C 7.2* 07/15/2011        Assessment & Plan:

## 2011-07-19 NOTE — Assessment & Plan Note (Signed)
Continue with current prescription therapy as reflected on the Med list.  

## 2011-07-19 NOTE — Assessment & Plan Note (Signed)
Chronic Risks associated with treatment noncompliance were discussed. Compliance was encouraged. Continue with current prescription therapy as reflected on the Med list.

## 2011-07-19 NOTE — Assessment & Plan Note (Signed)
Chronic Risks associated with treatment noncompliance were discussed. Compliance was encouraged. Re-start Rx 

## 2011-07-19 NOTE — Assessment & Plan Note (Signed)
Situational - divorce 2012

## 2011-08-11 ENCOUNTER — Encounter (HOSPITAL_COMMUNITY): Payer: Self-pay

## 2011-08-11 ENCOUNTER — Emergency Department (INDEPENDENT_AMBULATORY_CARE_PROVIDER_SITE_OTHER)
Admission: EM | Admit: 2011-08-11 | Discharge: 2011-08-11 | Disposition: A | Discharge status: Home / Self Care | Payer: 59 | Source: Home / Self Care | Attending: Emergency Medicine | Admitting: Emergency Medicine

## 2011-08-11 DIAGNOSIS — IMO0002 Reserved for concepts with insufficient information to code with codable children: Secondary | ICD-10-CM

## 2011-08-11 MED ORDER — ACETAMINOPHEN 325 MG PO TABS
925.0000 mg | ORAL_TABLET | Freq: Once | ORAL | Status: AC
  Administered 2011-08-11: 975 mg via ORAL

## 2011-08-11 MED ORDER — ACETAMINOPHEN 325 MG PO TABS
ORAL_TABLET | ORAL | Status: AC
  Filled 2011-08-11: qty 3

## 2011-08-11 MED ORDER — ACETAMINOPHEN 325 MG PO TABS
975.0000 mg | ORAL_TABLET | Freq: Once | ORAL | Status: AC
  Administered 2011-08-11: 975 mg via ORAL

## 2011-08-11 MED ORDER — IBUPROFEN 800 MG PO TABS: 800.0000 mg | ORAL_TABLET | Freq: Once | ORAL | Status: DC

## 2011-08-11 NOTE — ED Notes (Signed)
C/o pain to rt thumb since Sunday.  States became red and swollen yesterday.  Denies injury.

## 2011-08-11 NOTE — ED Notes (Signed)
Only one dose of Acetaminophen 975 mg given po to patient- duplicate documentation and unable to remove from chart.

## 2011-08-11 NOTE — ED Provider Notes (Signed)
History     CSN: [id number]  Arrival date & time 08/11/11  7253   First MD Initiated Contact with Patient 08/11/11 [id number]      Chief Complaint  Patient presents with  . Hand Pain    (Consider location/radiation/quality/duration/timing/severity/associated sxs/prior treatment) HPI Comments: R thumb pain, started to feel painful since Sunday, progressively got worse" Its swollen on the tip of my finger, its much worse than yesterday can barely take the pain, been taking motrin, but not helping any longer, don't recall any injuries" "Where this  came from?  Patient is a 41 y.o. female presenting with hand pain. The history is provided by the patient.  Hand Pain This is a new problem. The current episode started more than 2 days ago (5 DAYS AGO). The symptoms are aggravated by bending. She has tried nothing for the symptoms.    Past Medical History  Diagnosis Date  . Anemia     iron deficiency  . Obesity     BMI=44  . Vitamin B12 deficiency   . Vitamin D deficiency   . Asthma   . Sleep apnea     on CPAP  . Depression 2011  . Diabetes mellitus 2010    type II  . GERD (gastroesophageal reflux disease) 2004  . Hypertension 2008    Past Surgical History  Procedure Date  . Fibroidectomy 2002  . Tonsillectomy and adenoidectomy   . Cesarean section   . Myomectomy     Family History  Problem Relation Age of Onset  . Hypertension Mother     father, sisters  . Cholelithiasis Sister   . Clotting disorder Daughter   . Diabetes Mother     sister  . Kidney cancer Mother     History  Substance Use Topics  . Smoking status: Never Smoker   . Smokeless tobacco: Never Used  . Alcohol Use: No    OB History    Grav Para Term Preterm Abortions TAB SAB Ect Mult Living                  Review of Systems  Constitutional: Negative for fever and fatigue.  Skin: Positive for color change.    Allergies  Codeine sulfate; Hydrochlorothiazide w/triamterene; and  Oxycodone-acetaminophen  Home Medications   Current Outpatient Rx  Name Route Sig Dispense Refill  . NIFEREX-150 FORTE 50-100 MG PO CAPS Oral Take 1 tablet by mouth daily. 90 each 3  . BLOOD GLUCOSE TEST VI STRP In Vitro 1 each by In Vitro route daily. 100 each 2  . IBUPROFEN 800 MG PO TABS Oral Take 800 mg by mouth as needed.      Marland Kitchen KETOCONAZOLE 2 % EX CREA Topical Apply topically daily. 45 g 3  . AUTO-LANCETS MISC Does not apply 1 each by Does not apply route daily. 100 each 2  . METFORMIN HCL ER 500 MG PO TB24 Oral Take 2 tablets (1,000 mg total) by mouth 2 (two) times daily with a meal. 360 tablet 3  . NORETHINDRONE-ETH ESTRADIOL 0.5-35 MG-MCG PO TABS Oral Take 1 tablet by mouth daily.      Marland Kitchen PANTOPRAZOLE SODIUM 40 MG PO TBEC Oral Take 40 mg by mouth daily.      Marland Kitchen RANITIDINE HCL 300 MG PO TABS Oral Take 1 tablet (300 mg total) by mouth at bedtime. 90 tablet 3  . SITAGLIPTIN PHOSPHATE 100 MG PO TABS Oral Take 100 mg by mouth daily.      Marland Kitchen SPIRONOLACTONE  100 MG PO TABS Oral Take 1 tablet (100 mg total) by mouth daily. 90 tablet 2  . VALACYCLOVIR HCL 1 G PO TABS Oral Take 1,000 mg by mouth daily.      Marland Kitchen VITAMIN D 1000 UNITS PO TABS Oral Take 1 tablet (1,000 Units total) by mouth daily. 100 tablet 3  . VITAMIN B-12 1000 MCG SL SUBL Sublingual Place 1 tablet (1,000 mcg total) under the tongue daily. 100 tablet 3  . LINAGLIPTIN 5 MG PO TABS Oral Take 1 tablet by mouth daily. 90 tablet 3  . NON FORMULARY  Ferosul 325- daily       BP 156/96  Pulse 93  Temp(Src) 98 F (36.7 C) (Oral)  Resp 18  SpO2 97%  LMP 07/25/2011  Physical Exam  Constitutional: She appears distressed.  Musculoskeletal: She exhibits tenderness.       Hands: Skin: Rash noted.    ED Course  Procedures (including critical care time)  Labs Reviewed - No data to display No results found.   1. Felon       MDM  R thumb felon with co-existent cellulitis x 5 days- Discussed case with Dr.Gramig- will see at  his office for sx felon intervention        Jimmie Molly, MD 08/11/11 1249

## 2011-08-12 ENCOUNTER — Other Ambulatory Visit (HOSPITAL_COMMUNITY): Payer: Self-pay | Admitting: Orthopedic Surgery

## 2011-08-12 ENCOUNTER — Encounter (HOSPITAL_COMMUNITY)
Admission: RE | Admit: 2011-08-12 | Discharge: 2011-08-12 | Disposition: A | Discharge status: Home / Self Care | Payer: 59 | Source: Ambulatory Visit | Attending: Orthopedic Surgery | Admitting: Orthopedic Surgery

## 2011-08-12 DIAGNOSIS — L089 Local infection of the skin and subcutaneous tissue, unspecified: Secondary | ICD-10-CM | POA: Insufficient documentation

## 2011-08-12 MED ORDER — VANCOMYCIN HCL IN DEXTROSE 1-5 GM/200ML-% IV SOLN
1000.0000 mg | Freq: Once | INTRAVENOUS | Status: AC
  Administered 2011-08-12: 1000 mg via INTRAVENOUS
  Filled 2011-08-12: qty 200

## 2011-08-22 ENCOUNTER — Other Ambulatory Visit: Payer: Self-pay | Admitting: *Deleted

## 2011-08-22 ENCOUNTER — Other Ambulatory Visit: Payer: Self-pay | Admitting: Internal Medicine

## 2011-08-22 MED ORDER — PANTOPRAZOLE SODIUM 40 MG PO TBEC: 40.0000 mg | DELAYED_RELEASE_TABLET | Freq: Every day | ORAL | Status: DC

## 2011-09-28 ENCOUNTER — Encounter (HOSPITAL_COMMUNITY): Payer: Self-pay | Admitting: Emergency Medicine

## 2011-09-28 ENCOUNTER — Other Ambulatory Visit: Payer: Self-pay

## 2011-09-28 ENCOUNTER — Emergency Department (HOSPITAL_COMMUNITY): Payer: 59

## 2011-09-28 ENCOUNTER — Emergency Department (HOSPITAL_COMMUNITY)
Admission: EM | Admit: 2011-09-28 | Discharge: 2011-09-29 | Disposition: A | Discharge status: Home / Self Care | Payer: 59 | Attending: Emergency Medicine | Admitting: Emergency Medicine

## 2011-09-28 DIAGNOSIS — I10 Essential (primary) hypertension: Secondary | ICD-10-CM | POA: Insufficient documentation

## 2011-09-28 DIAGNOSIS — R1013 Epigastric pain: Secondary | ICD-10-CM | POA: Insufficient documentation

## 2011-09-28 DIAGNOSIS — R0602 Shortness of breath: Secondary | ICD-10-CM | POA: Insufficient documentation

## 2011-09-28 DIAGNOSIS — E119 Type 2 diabetes mellitus without complications: Secondary | ICD-10-CM | POA: Insufficient documentation

## 2011-09-28 DIAGNOSIS — M546 Pain in thoracic spine: Secondary | ICD-10-CM | POA: Insufficient documentation

## 2011-09-28 DIAGNOSIS — E669 Obesity, unspecified: Secondary | ICD-10-CM | POA: Insufficient documentation

## 2011-09-28 DIAGNOSIS — F430 Acute stress reaction: Secondary | ICD-10-CM | POA: Insufficient documentation

## 2011-09-28 DIAGNOSIS — G4730 Sleep apnea, unspecified: Secondary | ICD-10-CM | POA: Insufficient documentation

## 2011-09-28 DIAGNOSIS — J45909 Unspecified asthma, uncomplicated: Secondary | ICD-10-CM | POA: Insufficient documentation

## 2011-09-28 DIAGNOSIS — R079 Chest pain, unspecified: Secondary | ICD-10-CM | POA: Insufficient documentation

## 2011-09-28 LAB — POCT I-STAT, CHEM 8
BUN: 11 mg/dL (ref 6–23)
Calcium, Ion: 1.14 mmol/L (ref 1.12–1.32)
Chloride: 102 mEq/L (ref 96–112)
Creatinine, Ser: 0.7 mg/dL (ref 0.50–1.10)
Glucose, Bld: 116 mg/dL — ABNORMAL HIGH (ref 70–99)
HCT: 33 % — ABNORMAL LOW (ref 36.0–46.0)
Hemoglobin: 11.2 g/dL — ABNORMAL LOW (ref 12.0–15.0)
Potassium: 3.5 mEq/L (ref 3.5–5.1)
Sodium: 137 mEq/L (ref 135–145)
TCO2: 24 mmol/L (ref 0–100)

## 2011-09-28 LAB — POCT I-STAT TROPONIN I: Troponin i, poc: 0 ng/mL (ref 0.00–0.08)

## 2011-09-28 MED ORDER — SUCRALFATE 1 GM/10ML PO SUSP: 1.0000 g | Freq: Four times a day (QID) | ORAL | Status: DC

## 2011-09-28 MED ORDER — GI COCKTAIL ~~LOC~~
30.0000 mL | Freq: Once | ORAL | Status: AC
  Administered 2011-09-28: 30 mL via ORAL
  Filled 2011-09-28: qty 30

## 2011-09-28 NOTE — ED Provider Notes (Signed)
History     CSN: [id number]  Arrival date & time 09/28/11  1941   First MD Initiated Contact with Patient 09/28/11 2044      Chief Complaint  Patient presents with  . Chest Pain    HPI: Patient is a 41 y.o. female presenting with chest pain. The history is provided by the patient.  Chest Pain The chest pain began 3 - 5 days ago. Chest pain occurs constantly. The chest pain is worsening. At its most intense, the pain is at 10/10. The pain is currently at 5/10. The severity of the pain is moderate. The quality of the pain is described as pressure-like. The pain radiates to the upper back. Primary symptoms include shortness of breath. Pertinent negatives for primary symptoms include no fever, no cough, no wheezing, no palpitations, no abdominal pain, no nausea and no vomiting.   Pt reports onset of epigastric area pain Sunday that has persisted. States pain is a pressure that radiates to her back. Pain initially associated w/ lots of burping and "gas" which has since resolved. States has had this pain intermittently since 05/2011. Has had numerous studies done to investigate the pain w/o any definitive diagnosis. PAin has been constant since onset Sunday.  Today pain has been associated w/ SOB on and off all day which is new. Admits to remote hx of asthma but has not had any problems w/ it in years. Denies n/v, dizziness, weakness or other associated symptoms. Pain is worse when walking or standing upright and somewhat better when she is reclining.  Past Medical History  Diagnosis Date  . Anemia     iron deficiency  . Obesity     BMI=44  . Vitamin B12 deficiency   . Vitamin d deficiency   . Asthma   . Sleep apnea     on CPAP  . Depression 2011  . Diabetes mellitus 2010    type II  . GERD (gastroesophageal reflux disease) 2004  . Hypertension 2008    Past Surgical History  Procedure Date  . Fibroidectomy 2002  . Tonsillectomy and adenoidectomy   . Cesarean section   . Myomectomy      Family History  Problem Relation Age of Onset  . Hypertension Mother     father, sisters  . Cholelithiasis Sister   . Clotting disorder Daughter   . Diabetes Mother     sister  . Kidney cancer Mother     History  Substance Use Topics  . Smoking status: Never Smoker   . Smokeless tobacco: Never Used  . Alcohol Use: No    OB History    Grav Para Term Preterm Abortions TAB SAB Ect Mult Living                  Review of Systems  Constitutional: Negative.  Negative for fever.  HENT: Negative.   Eyes: Negative.   Respiratory: Positive for shortness of breath. Negative for cough and wheezing.   Cardiovascular: Positive for chest pain. Negative for palpitations.  Gastrointestinal: Negative.  Negative for nausea, vomiting and abdominal pain.  Genitourinary: Negative.   Musculoskeletal: Negative.   Skin: Negative.   Neurological: Negative.   Hematological: Negative.   Psychiatric/Behavioral: Negative.     Allergies  Codeine sulfate; Hydrochlorothiazide w/triamterene; and Oxycodone-acetaminophen  Home Medications   Current Outpatient Rx  Name Route Sig Dispense Refill  . VITAMIN D 1000 UNITS PO TABS Oral Take 1 tablet (1,000 Units total) by mouth daily. 100 tablet 3  .  VITAMIN B-12 1000 MCG SL SUBL Sublingual Place 1 tablet (1,000 mcg total) under the tongue daily. 100 tablet 3  . NIFEREX-150 FORTE 50-100 MG PO CAPS Oral Take 1 tablet by mouth daily. 90 each 3  . BLOOD GLUCOSE TEST VI STRP In Vitro 1 each by In Vitro route daily. 100 each 2  . IBUPROFEN 800 MG PO TABS Oral Take 800 mg by mouth as needed.      Marland Kitchen KETOCONAZOLE 2 % EX CREA Topical Apply topically daily. 45 g 3  . AUTO-LANCETS MISC Does not apply 1 each by Does not apply route daily. 100 each 2  . LINAGLIPTIN 5 MG PO TABS Oral Take 1 tablet by mouth daily. 90 tablet 3  . METFORMIN HCL ER 500 MG PO TB24 Oral Take 2 tablets (1,000 mg total) by mouth 2 (two) times daily with a meal. 360 tablet 3  . NON  FORMULARY  Ferosul 325- daily     . NORETHINDRONE-ETH ESTRADIOL 0.5-35 MG-MCG PO TABS Oral Take 1 tablet by mouth daily.      Marland Kitchen PANTOPRAZOLE SODIUM 40 MG PO TBEC Oral Take 1 tablet (40 mg total) by mouth daily. 90 tablet 3  . RANITIDINE HCL 300 MG PO TABS Oral Take 1 tablet (300 mg total) by mouth at bedtime. 90 tablet 3  . SITAGLIPTIN PHOSPHATE 100 MG PO TABS Oral Take 100 mg by mouth daily.      Marland Kitchen SPIRONOLACTONE 100 MG PO TABS Oral Take 1 tablet (100 mg total) by mouth daily. 90 tablet 2  . VALACYCLOVIR HCL 1 G PO TABS Oral Take 1,000 mg by mouth daily.        BP 141/85  Pulse 108  Temp(Src) 99.6 F (37.6 C) (Oral)  Resp 26  SpO2 98%  LMP 09/19/2011  Physical Exam  Constitutional: She is oriented to person, place, and time. She appears well-developed and well-nourished.  HENT:  Head: Normocephalic and atraumatic.  Eyes: Conjunctivae are normal.  Neck: Neck supple.  Cardiovascular: Normal rate and regular rhythm.   Pulmonary/Chest: Effort normal and breath sounds normal.  Abdominal: Soft. Bowel sounds are normal.       No TTP  Musculoskeletal: Normal range of motion.  Neurological: She is alert and oriented to person, place, and time.  Skin: Skin is warm and dry. No erythema.  Psychiatric: She has a normal mood and affect.    ED Course  Procedures   Date: 09/28/2011  Rate: 105  Rhythm: sinus tachycardia  QRS Axis: normal  Intervals: normal  ST/T Wave abnormalities: [place] T abnormalities  Conductin a in a in a ion Disutrbances: none  Narrative Interpretation:   Old EKG Reviewed: Unchanged  Pt reports some relief of symptoms after GI cocktail. Findings and clinical impression discussed with patient. Will plan for discharge home and with prescription for Carafate to add to her current GI meds and encourage close follow up with her primary care physician if his discomfort continues. Patient is agreeable with pain.  Labs Reviewed  GLUCOSE, CAPILLARY - Abnormal;  Notable for the following:    Glucose-Capillary 173 (*)    All other components within normal limits   No results found.   No diagnosis found.    MDM  HPI/PE and clinical findings/course c/w 1. Chest pain (likely epigastric in nature as pt has significant hx of epigastric symptoms and work-up, some relief w/ GI cocktail/ EKG, CXR and Trop I normal, description of pain is atypical, "constant x 3 days)  Roma Kayser Drema Eddington, NP 09/29/11 [phone]

## 2011-09-28 NOTE — ED Notes (Signed)
Pt states she started having pressure in her chest on Sunday and it has been constant since  States she had some shortness of breath today  Pt states she has had episodes of this in the past  Pt states the pain is in her midepigastric area and radiates into her back  Pt states she has had several tests on her gallbladder all of which have come back negative  EKG done in triage

## 2011-09-28 NOTE — Discharge Instructions (Signed)
Please read over the instructions below. Your chest x-ray, EKG and cardiac enzymes tonight are normal. You're remaining lab work is normal as well. The likely source of your discomfort is your ongoing epigastric problems. We have added Carafate suspension to your current GI medications. Take as directed. Arrange follow up with Dr. Posey Rea and/or your GI Physician (Dr Delano Metz your discomfort continues. Return for worsening symptoms.  Chest Pain (Nonspecific) Chest pain has many causes. Your pain could be caused by something serious, such as a heart attack or a blood clot in the lungs. It could also be caused by something less serious, such as a chest bruise or a virus. Follow up with your doctor. More lab tests or other studies may be needed to find the cause of your pain. Most of the time, nonspecific chest pain will improve within 2 to 3 days of rest and mild pain medicine. HOME CARE  For chest bruises, you may put ice on the sore area for 15 to 20 minutes, 3 to 4 times a day. Do this only if it makes you or your child feel better.   Put ice in a plastic bag.   Place a towel between the skin and the bag.   Rest for the next 2 to 3 days.   Go back to work if the pain improves.   See your doctor if the pain lasts longer than 1 to 2 weeks.   Only take medicine as told by your doctor.   Quit smoking if you smoke.  GET HELP RIGHT AWAY IF:   There is more pain or pain that spreads to the arm, neck, jaw, back, or belly (abdomen).   You or your child has shortness of breath.   You or your child coughs more than usual or coughs up blood.   You or your child has very bad back or belly pain, feels sick to his or her stomach (nauseous), or throws up (vomits).   You or your child has very bad weakness.   You or your child passes out (faints).   You or your child has a temperature by mouth above 102 F (38.9 C), not controlled by medicine.  MAKE SURE YOU:   Understand these  instructions.   Will watch this condition.   Will get help right away if you or your child is not doing well or gets worse.  Document Released: 01/04/2008 Document Revised: 03/30/2011 Document Reviewed: 01/04/2008 [facility] Patient Information 2012 [place], [place].  Shortness of Breath Shortness of breath (dyspnea) is the feeling of uneasy breathing. Shortness of breath needs care right away. HOME CARE   Do not smoke.   Avoid being around chemicals that may bother your breathing (such as paint fumes or dust).   Rest as needed. Slowly begin your usual activities.   Only take medicine as told by your doctor. Inhaled medicines or oxygen might be part of your treatment.   Follow up with your doctor as told. Waiting to do so or failure to follow up could result in worsening your condition and possible disability or death.   Understand what to do or who to call if your shortness of breath gets worse.  GET HELP RIGHT AWAY IF:   You get pain in your chest, shoulders, belly (abdomen), or jaw.   You cannot stop coughing or you start wheezing.   You cough up blood or thick mucus.   You can only speak with short words.   You have a fever.  You feel your heart racing or skipping beats.   You are not breathing better when you stop and rest.   Your condition does not improve in the time expected.   You have problems with medicines.  MAKE SURE YOU:  Understand these instructions.   Will watch your condition.   Will get help right away if you are not doing well or get worse.  Document Released: 01/04/2008 Document Revised: 03/30/2011 Document Reviewed: 06/03/2009 [facility] Patient Information 2012 [place], [place]   .Abdominal Pain Many things can cause belly (abdominal) pain. Most times, the belly pain is not dangerous. The amount of belly pain does not tell how serious the problem may be. Many cases of belly pain can be watched and treated at home. HOME CARE   Do not take  medicines that help you go poop (laxatives) unless told to by your doctor.   Only take medicine as told by your doctor.   Eat or drink as told by your doctor. Your doctor will tell you if you should be on a special diet.  GET HELP RIGHT AWAY IF:   The pain does not go away.   You have a fever.   You keep throwing up (vomiting).   The pain changes and is only in the right or left part of the belly.   You have bloody or tarry looking poop.  MAKE SURE YOU:   Understand these instructions.   Will watch your condition.   Will get help right away if you are not doing well or get worse.  Document Released: 01/04/2008 Document Revised: 03/30/2011 Document Reviewed: 08/03/2009 [facility] Patient Information 2012 [place], [place].

## 2011-09-30 NOTE — ED Provider Notes (Signed)
Medical screening examination/treatment/procedure(s) were performed by non-physician practitioner and as supervising physician I was immediately available for consultation/collaboration. Jemell Town Y.   Faven Watterson Y. Krew Hortman, MD 09/30/11 0035 

## 2011-11-15 ENCOUNTER — Other Ambulatory Visit (INDEPENDENT_AMBULATORY_CARE_PROVIDER_SITE_OTHER): Payer: 59

## 2011-11-15 DIAGNOSIS — D509 Iron deficiency anemia, unspecified: Secondary | ICD-10-CM

## 2011-11-15 DIAGNOSIS — E119 Type 2 diabetes mellitus without complications: Secondary | ICD-10-CM

## 2011-11-15 DIAGNOSIS — E559 Vitamin D deficiency, unspecified: Secondary | ICD-10-CM

## 2011-11-15 DIAGNOSIS — E538 Deficiency of other specified B group vitamins: Secondary | ICD-10-CM

## 2011-11-15 DIAGNOSIS — F329 Major depressive disorder, single episode, unspecified: Secondary | ICD-10-CM

## 2011-11-15 LAB — IBC PANEL
Iron: 41 ug/dL — ABNORMAL LOW (ref 42–145)
Transferrin: 416.1 mg/dL — ABNORMAL HIGH (ref 212.0–360.0)

## 2011-11-15 LAB — CBC WITH DIFFERENTIAL/PLATELET
Basophils Absolute: 0 10*3/uL (ref 0.0–0.1)
Eosinophils Absolute: 0.1 10*3/uL (ref 0.0–0.7)
Hemoglobin: 9.7 g/dL — ABNORMAL LOW (ref 12.0–15.0)
Lymphocytes Relative: 20.3 % (ref 12.0–46.0)
MCHC: 31.4 g/dL (ref 30.0–36.0)
MCV: 75.1 fl — ABNORMAL LOW (ref 78.0–100.0)
Monocytes Absolute: 0.6 10*3/uL (ref 0.1–1.0)
Neutro Abs: 5.4 10*3/uL (ref 1.4–7.7)
Neutrophils Relative %: 70.6 % (ref 43.0–77.0)
RDW: 18.3 % — ABNORMAL HIGH (ref 11.5–14.6)

## 2011-11-15 LAB — BASIC METABOLIC PANEL
Chloride: 102 mEq/L (ref 96–112)
Potassium: 3.9 mEq/L (ref 3.5–5.1)

## 2011-11-15 LAB — VITAMIN B12: Vitamin B-12: 256 pg/mL (ref 211–911)

## 2011-11-15 LAB — HEMOGLOBIN A1C: Hgb A1c MFr Bld: 7.1 % — ABNORMAL HIGH (ref 4.6–6.5)

## 2011-11-17 ENCOUNTER — Telehealth: Payer: Self-pay | Admitting: Internal Medicine

## 2011-11-17 MED ORDER — ERGOCALCIFEROL 1.25 MG (50000 UT) PO CAPS: 50000.0000 [IU] | ORAL_CAPSULE | ORAL | Status: DC

## 2011-11-17 NOTE — Telephone Encounter (Signed)
Julia Estrada, please, inform patient that her vit D is low Take Rx

## 2011-11-17 NOTE — Telephone Encounter (Signed)
Left detailed mess informing pt of below.  

## 2011-11-18 ENCOUNTER — Ambulatory Visit: Payer: 59 | Admitting: Internal Medicine

## 2011-12-01 ENCOUNTER — Ambulatory Visit (INDEPENDENT_AMBULATORY_CARE_PROVIDER_SITE_OTHER): Payer: 59 | Admitting: Internal Medicine

## 2011-12-01 ENCOUNTER — Encounter: Payer: Self-pay | Admitting: Internal Medicine

## 2011-12-01 VITALS — BP 122/88 | HR 84 | Temp 97.7°F | Resp 16 | Wt 290.0 lb

## 2011-12-01 DIAGNOSIS — D509 Iron deficiency anemia, unspecified: Secondary | ICD-10-CM

## 2011-12-01 DIAGNOSIS — E119 Type 2 diabetes mellitus without complications: Secondary | ICD-10-CM

## 2011-12-01 DIAGNOSIS — F329 Major depressive disorder, single episode, unspecified: Secondary | ICD-10-CM

## 2011-12-01 DIAGNOSIS — K219 Gastro-esophageal reflux disease without esophagitis: Secondary | ICD-10-CM

## 2011-12-01 DIAGNOSIS — E538 Deficiency of other specified B group vitamins: Secondary | ICD-10-CM

## 2011-12-01 DIAGNOSIS — R0600 Dyspnea, unspecified: Secondary | ICD-10-CM

## 2011-12-01 DIAGNOSIS — R0989 Other specified symptoms and signs involving the circulatory and respiratory systems: Secondary | ICD-10-CM

## 2011-12-01 MED ORDER — AMOXICILLIN 500 MG PO CAPS: 1000.0000 mg | ORAL_CAPSULE | Freq: Two times a day (BID) | ORAL | Status: AC

## 2011-12-01 MED ORDER — CYANOCOBALAMIN 1000 MCG/ML IJ SOLN
1000.0000 ug | Freq: Once | INTRAMUSCULAR | Status: AC
Start: 2011-12-01 — End: 2011-12-01
  Administered 2011-12-01: 1000 ug via INTRAMUSCULAR

## 2011-12-01 NOTE — Patient Instructions (Signed)
Zyrtec 1 daily

## 2011-12-01 NOTE — Progress Notes (Signed)
Patient ID: Julia Estrada, female   DOB: [birth year], 41 y.o.   MRN: [id number]  Subjective:    Patient ID: Julia Estrada, female    DOB: 04/11/1971, 41 y.o.   MRN: [id number]  Shortness of Breath Pertinent negatives include no abdominal pain, headaches or rash.  C/o her breath cuts off when she is putting her chin down... The patient presents for a follow-up of  chronic hypertension, chronic dyslipidemia, type 2 diabetes controlled with medicines. Stress is better. F/u anemia    Review of Systems  Constitutional: Negative for chills, activity change, appetite change, fatigue and unexpected weight change.  HENT: Negative for congestion, mouth sores and sinus pressure.   Eyes: Negative for visual disturbance.  Respiratory: Positive for shortness of breath. Negative for cough and chest tightness.   Gastrointestinal: Negative for nausea and abdominal pain.  Genitourinary: Negative for frequency, difficulty urinating and vaginal pain.  Musculoskeletal: Negative for back pain and gait problem.  Skin: Negative for pallor and rash.  Neurological: Negative for dizziness, tremors, weakness, numbness and headaches.  Psychiatric/Behavioral: Negative for suicidal ideas, confusion and sleep disturbance. The patient is not nervous/anxious.    Wt Readings from Last 3 Encounters:  12/01/11 290 lb (131.543 kg)  08/12/11 282 lb (127.914 kg)  07/19/11 282 lb (127.914 kg)   BP Readings from Last 3 Encounters:  12/01/11 122/88  09/28/11 110/56  08/12/11 136/85        Objective:   Physical Exam  Constitutional: She appears well-developed. No distress.       obese  HENT:  Head: Normocephalic.  Right Ear: External ear normal.  Left Ear: External ear normal.  Nose: Nose normal.  Mouth/Throat: Oropharynx is clear and moist.  Eyes: Conjunctivae are normal. Pupils are equal, round, and reactive to light. Right eye exhibits no discharge. Left eye exhibits no discharge.  Neck: Normal range of motion. Neck  supple. No JVD present. No tracheal deviation present. No thyromegaly present.  Cardiovascular: Normal rate, regular rhythm and normal heart sounds.   Pulmonary/Chest: No stridor. No respiratory distress. She has no wheezes.  Abdominal: Soft. Bowel sounds are normal. She exhibits no distension and no mass. There is no tenderness. There is no rebound and no guarding.  Musculoskeletal: She exhibits no edema and no tenderness.  Lymphadenopathy:    She has no cervical adenopathy.  Neurological: She displays normal reflexes. No cranial nerve deficit. She exhibits normal muscle tone. Coordination normal.  Skin: No rash noted. No erythema.  Psychiatric: She has a normal mood and affect. Her behavior is normal. Judgment and thought content normal.  No neck mass   Lab Results  Component Value Date   WBC 7.6 11/15/2011   HGB 9.7* 11/15/2011   HCT 30.7* 11/15/2011   PLT 359.0 11/15/2011   GLUCOSE 94 11/15/2011   CHOL 165 12/14/2010   TRIG 142.0 12/14/2010   HDL 43.20 12/14/2010   LDLDIRECT 92.4 09/29/2008   LDLCALC 93 12/14/2010   ALT 21 07/15/2011   AST 14 07/15/2011   NA 137 11/15/2011   K 3.9 11/15/2011   CL 102 11/15/2011   CREATININE 0.7 11/15/2011   BUN 13 11/15/2011   CO2 27 11/15/2011   TSH 1.07 07/15/2011   HGBA1C 7.1* 11/15/2011        Assessment & Plan:

## 2011-12-04 ENCOUNTER — Encounter: Payer: Self-pay | Admitting: Internal Medicine

## 2011-12-04 DIAGNOSIS — R0600 Dyspnea, unspecified: Secondary | ICD-10-CM | POA: Insufficient documentation

## 2011-12-04 NOTE — Assessment & Plan Note (Signed)
Continue with current prescription therapy as reflected on the Med list.  

## 2011-12-04 NOTE — Assessment & Plan Note (Signed)
5/13 R/o mechanical upper airway obstruction ENT consult To ER if problems

## 2011-12-04 NOTE — Assessment & Plan Note (Addendum)
Improved

## 2011-12-04 NOTE — Assessment & Plan Note (Signed)
Re-start Rx 

## 2012-01-03 ENCOUNTER — Other Ambulatory Visit: Payer: Self-pay | Admitting: Orthopedic Surgery

## 2012-01-03 DIAGNOSIS — M25561 Pain in right knee: Secondary | ICD-10-CM

## 2012-01-11 ENCOUNTER — Ambulatory Visit (HOSPITAL_COMMUNITY)
Admission: RE | Admit: 2012-01-11 | Discharge: 2012-01-11 | Disposition: A | Discharge status: Home / Self Care | Payer: 59 | Source: Ambulatory Visit | Attending: Orthopedic Surgery | Admitting: Orthopedic Surgery

## 2012-01-11 DIAGNOSIS — M25569 Pain in unspecified knee: Secondary | ICD-10-CM | POA: Insufficient documentation

## 2012-01-11 DIAGNOSIS — M2240 Chondromalacia patellae, unspecified knee: Secondary | ICD-10-CM | POA: Insufficient documentation

## 2012-01-11 DIAGNOSIS — M25469 Effusion, unspecified knee: Secondary | ICD-10-CM | POA: Insufficient documentation

## 2012-01-11 DIAGNOSIS — M25561 Pain in right knee: Secondary | ICD-10-CM

## 2012-01-24 ENCOUNTER — Other Ambulatory Visit: Payer: Self-pay

## 2012-01-24 NOTE — Telephone Encounter (Signed)
Pt called requesting Rx for anxiety. Pt is going on vacation for 1 week and will have to fly, she is anxious about this.

## 2012-01-25 ENCOUNTER — Telehealth: Payer: Self-pay | Admitting: *Deleted

## 2012-01-25 DIAGNOSIS — D649 Anemia, unspecified: Secondary | ICD-10-CM

## 2012-01-25 DIAGNOSIS — E559 Vitamin D deficiency, unspecified: Secondary | ICD-10-CM

## 2012-01-25 DIAGNOSIS — E538 Deficiency of other specified B group vitamins: Secondary | ICD-10-CM

## 2012-01-25 DIAGNOSIS — E119 Type 2 diabetes mellitus without complications: Secondary | ICD-10-CM

## 2012-01-25 MED ORDER — LORAZEPAM 0.5 MG PO TABS: 0.5000 mg | ORAL_TABLET | Freq: Two times a day (BID) | ORAL | Status: AC | PRN

## 2012-01-25 NOTE — Telephone Encounter (Signed)
Pt returned my call and requested that I call her after 2pm at [phone]. Attempted to reach pt and left message at [phone] to return my call.

## 2012-01-25 NOTE — Telephone Encounter (Signed)
Pt scheduled follow up for 03/30/12 and wanted to know if she should have labs drawn prior to her appt?  If so please advise re: tests/dx codes.

## 2012-01-25 NOTE — Telephone Encounter (Signed)
Rx called to pharmacist and message left for pt to return my call.

## 2012-01-25 NOTE — Telephone Encounter (Signed)
Pt notified and voices understanding. Pt scheduled 3 month f/u for 03/30/12 at 2:15pm.

## 2012-01-25 NOTE — Telephone Encounter (Signed)
Ok Lorazepam - use w/caution Thx

## 2012-01-26 NOTE — Telephone Encounter (Signed)
Done. Thx.

## 2012-02-09 ENCOUNTER — Other Ambulatory Visit: Payer: Self-pay | Admitting: *Deleted

## 2012-02-09 MED ORDER — SPIRONOLACTONE 100 MG PO TABS: 100.0000 mg | ORAL_TABLET | Freq: Every day | ORAL | Status: DC

## 2012-03-09 ENCOUNTER — Encounter (HOSPITAL_COMMUNITY): Payer: Self-pay | Admitting: Emergency Medicine

## 2012-03-09 ENCOUNTER — Emergency Department (HOSPITAL_COMMUNITY)
Admission: EM | Admit: 2012-03-09 | Discharge: 2012-03-09 | Disposition: A | Discharge status: Home / Self Care | Payer: 59 | Attending: Emergency Medicine | Admitting: Emergency Medicine

## 2012-03-09 ENCOUNTER — Emergency Department (HOSPITAL_COMMUNITY): Payer: 59

## 2012-03-09 DIAGNOSIS — E119 Type 2 diabetes mellitus without complications: Secondary | ICD-10-CM | POA: Insufficient documentation

## 2012-03-09 DIAGNOSIS — I10 Essential (primary) hypertension: Secondary | ICD-10-CM | POA: Insufficient documentation

## 2012-03-09 DIAGNOSIS — R0789 Other chest pain: Secondary | ICD-10-CM | POA: Insufficient documentation

## 2012-03-09 DIAGNOSIS — G4730 Sleep apnea, unspecified: Secondary | ICD-10-CM | POA: Insufficient documentation

## 2012-03-09 DIAGNOSIS — R079 Chest pain, unspecified: Secondary | ICD-10-CM

## 2012-03-09 DIAGNOSIS — Z6841 Body Mass Index (BMI) 40.0 and over, adult: Secondary | ICD-10-CM | POA: Insufficient documentation

## 2012-03-09 DIAGNOSIS — E669 Obesity, unspecified: Secondary | ICD-10-CM | POA: Insufficient documentation

## 2012-03-09 DIAGNOSIS — K219 Gastro-esophageal reflux disease without esophagitis: Secondary | ICD-10-CM | POA: Insufficient documentation

## 2012-03-09 DIAGNOSIS — J45909 Unspecified asthma, uncomplicated: Secondary | ICD-10-CM | POA: Insufficient documentation

## 2012-03-09 DIAGNOSIS — Z79899 Other long term (current) drug therapy: Secondary | ICD-10-CM | POA: Insufficient documentation

## 2012-03-09 LAB — CBC
HCT: 31.6 % — ABNORMAL LOW (ref 36.0–46.0)
Hemoglobin: 10.2 g/dL — ABNORMAL LOW (ref 12.0–15.0)
MCH: 23.8 pg — ABNORMAL LOW (ref 26.0–34.0)
MCHC: 32.3 g/dL (ref 30.0–36.0)

## 2012-03-09 LAB — BASIC METABOLIC PANEL
BUN: 11 mg/dL (ref 6–23)
Calcium: 9.4 mg/dL (ref 8.4–10.5)
Creatinine, Ser: 0.73 mg/dL (ref 0.50–1.10)
GFR calc non Af Amer: >90 mL/min (ref >90)
Glucose, Bld: 116 mg/dL — ABNORMAL HIGH (ref 70–99)

## 2012-03-09 NOTE — ED Notes (Signed)
Pt states she began to have chest pain about 3 pm today  Pt states pain is on the left side of her chest and describes as a squeezing feeling that comes and goes  Denies any other sxs  Family hx of heart problems on her mothers side

## 2012-03-09 NOTE — ED Notes (Signed)
Pt reports left sided chest pain intermittently starting at 3PM.  Pt denies history of similar pain.  Pt denies N/V or shortness of breath.  Pt reports history of hypertension. Denies hypercholesterolemia.  Pt denies taking medication for pain.  Pt reports pain is 5/10 at this time. Pt states pain does not increase with movement or breathing.

## 2012-03-09 NOTE — ED Provider Notes (Signed)
History     CSN: [id number]  Arrival date & time 03/09/12  1910   First MD Initiated Contact with Patient 03/09/12 2118      Chief Complaint  Patient presents with  . Chest Pain    (Consider location/radiation/quality/duration/timing/severity/associated sxs/prior treatment) HPI  41 year old female with history of diabetes, hypertension, and GERD presents complaining of chest pain. Patient reports while driving today she experiencing a pressure and squeezing sensation to her left upper chest. Onset was gradual, intermittent, lasting only for seconds and will come back 10 minutes later. Pain is nonradiating, nothing makes it better or worse. She denies fever, chills, cough, shortness of breath, nausea, vomiting, diaphoresis. She has a history of heartburn but states this felt different. She is currently chest pain-free. She reports having a history of heart disease in the family. She is not a smoker. No priors history of catheterization or provocative testing done before.  Denies any recent stress exercise, or heavy lifting.  Past Medical History  Diagnosis Date  . Anemia     iron deficiency  . Obesity     BMI=44  . Vitamin B12 deficiency   . Vitamin d deficiency   . Asthma   . Sleep apnea     on CPAP  . Depression 2011  . Diabetes mellitus 2010    type II  . GERD (gastroesophageal reflux disease) 2004  . Hypertension 2008    Past Surgical History  Procedure Date  . Fibroidectomy 2002  . Tonsillectomy and adenoidectomy   . Cesarean section   . Myomectomy     Family History  Problem Relation Age of Onset  . Hypertension Mother     father, sisters  . Diabetes Mother     sister  . Kidney cancer Mother   . Cancer Mother   . Cholelithiasis Sister   . Clotting disorder Daughter     History  Substance Use Topics  . Smoking status: Never Smoker   . Smokeless tobacco: Never Used  . Alcohol Use: No    OB History    Grav Para Term Preterm Abortions TAB SAB Ect Mult  Living                  Review of Systems  All other systems reviewed and are negative.    Allergies  Codeine sulfate; Hydrochlorothiazide w-triamterene; and Oxycodone-acetaminophen  Home Medications   Current Outpatient Rx  Name Route Sig Dispense Refill  . VITAMIN D 1000 UNITS PO TABS Oral Take 1,000 Units by mouth daily.    Marland Kitchen VITAMIN B-12 1000 MCG SL SUBL Sublingual Place 1 tablet (1,000 mcg total) under the tongue daily. 100 tablet 3  . CYANOCOBALAMIN 1000 MCG SL SUBL Sublingual Place 1 tablet under the tongue daily.    Marland Kitchen FERROUS SULFATE 325 (65 FE) MG PO TABS Oral Take 325 mg by mouth daily with breakfast.    . IBUPROFEN 800 MG PO TABS Oral Take 800 mg by mouth as needed.      Marland Kitchen KETOCONAZOLE 2 % EX CREA Topical Apply 1 application topically daily.    Marland Kitchen METFORMIN HCL ER 500 MG PO TB24 Oral Take 1,000 mg by mouth 2 (two) times daily with a meal.    . NORETHINDRONE-ETH ESTRADIOL 0.5-35 MG-MCG PO TABS Oral Take 1 tablet by mouth daily.      Marland Kitchen PANTOPRAZOLE SODIUM 40 MG PO TBEC Oral Take 40 mg by mouth daily.    Marland Kitchen RANITIDINE HCL 300 MG PO TABS Oral Take  300 mg by mouth at bedtime.    Marland Kitchen SITAGLIPTIN PHOSPHATE 100 MG PO TABS Oral Take 100 mg by mouth daily.      Marland Kitchen SPIRONOLACTONE 100 MG PO TABS Oral Take 100 mg by mouth daily.    Marland Kitchen VALACYCLOVIR HCL 1 G PO TABS Oral Take 1,000 mg by mouth daily.        BP 148/81  Pulse 89  Temp 99.5 F (37.5 C) (Oral)  Resp 22  SpO2 99%  LMP 03/05/2012  Physical Exam  Nursing note and vitals reviewed. Constitutional: She is oriented to person, place, and time. She appears well-developed and well-nourished. No distress.       Awake, alert, nontoxic appearance  HENT:  Head: Atraumatic.  Eyes: Conjunctivae are normal. Right eye exhibits no discharge. Left eye exhibits no discharge.  Neck: Neck supple.  Cardiovascular: Normal rate and regular rhythm.  Exam reveals no gallop and no friction rub.   No murmur heard. Pulmonary/Chest: Effort  normal. No respiratory distress. She exhibits no tenderness.  Abdominal: Soft. There is no tenderness. There is no rebound.  Musculoskeletal: She exhibits no tenderness.       ROM appears intact, no obvious focal weakness  Neurological: She is alert and oriented to person, place, and time.       Mental status and motor strength appears intact  Skin: No rash noted.  Psychiatric: She has a normal mood and affect.    ED Course  Procedures (including critical care time)   Labs Reviewed  CBC  BASIC METABOLIC PANEL   No results found.   No diagnosis found.   Date: 03/09/2012  Rate: 95  Rhythm: normal sinus rhythm  QRS Axis: normal  Intervals: normal  ST/T Wave abnormalities: nonspecific T wave changes  Conduction Disutrbances:none  Narrative Interpretation:   Old EKG Reviewed: none available  Results for orders placed during the hospital encounter of 03/09/12  CBC      Component Value Range   WBC 9.2  4.0 - 10.5 K/uL   RBC 4.28  3.87 - 5.11 MIL/uL   Hemoglobin 10.2 (*) 12.0 - 15.0 g/dL   HCT 62.1 (*) 30.8 - 65.7 %   MCV 73.8 (*) 78.0 - 100.0 fL   MCH 23.8 (*) 26.0 - 34.0 pg   MCHC 32.3  30.0 - 36.0 g/dL   RDW 84.6 (*) 96.2 - 95.2 %   Platelets 385  150 - 400 K/uL  BASIC METABOLIC PANEL      Component Value Range   Sodium 136  135 - 145 mEq/L   Potassium 3.4 (*) 3.5 - 5.1 mEq/L   Chloride 98  96 - 112 mEq/L   CO2 25  19 - 32 mEq/L   Glucose, Bld 116 (*) 70 - 99 mg/dL   BUN 11  6 - 23 mg/dL   Creatinine, Ser 8.41  0.50 - 1.10 mg/dL   Calcium 9.4  8.4 - 32.4 mg/dL   GFR calc non Af Amer >90  >90 mL/min   GFR calc Af Amer >90  >90 mL/min  POCT I-STAT TROPONIN I      Component Value Range   Troponin i, poc 0.00  0.00 - 0.08 ng/mL   Comment 3            Dg Chest 2 View  03/09/2012  *RADIOLOGY REPORT*  Clinical Data: Chest pain, hypertension.  CHEST - 2 VIEW  Comparison: 09/28/2011  Findings: Heart size upper normal to mildly enlarged.  There is  mild central vascular  congestion without overt edema.  No focal consolidation, pleural effusion, or pneumothorax.  No acute osseous finding.  IMPRESSION: Heart size upper normal to mildly enlarged.  No focal consolidation.  Original Report Authenticated By: Waneta Martins, M.D.      MDM  Intermittent chest pressure which started today. She is currently chest pain-free. Cardiac workup initiated. TIMI 0, HEART score <4.  10:32 PM Atypical cp.  Work up unremarkable.  ECG unremarkable and VSS.  I discussed with my attending who recommend discharging pt at this time.  I suggest for pt to f/u with her PCP Dr. Posey Rea or to return if her sxs worsen.        Fayrene Helper, PA-C 03/09/12 2312

## 2012-03-09 NOTE — ED Provider Notes (Signed)
Medical screening examination/treatment/procedure(s) were performed by non-physician practitioner and as supervising physician I was immediately available for consultation/collaboration.  Geonna Lockyer, MD 03/09/12 2335 

## 2012-03-09 NOTE — ED Notes (Signed)
EKG given to EDP,Belfi,MD. 

## 2012-03-22 ENCOUNTER — Telehealth: Payer: Self-pay | Admitting: *Deleted

## 2012-03-22 DIAGNOSIS — Z114 Encounter for screening for human immunodeficiency virus [HIV]: Secondary | ICD-10-CM

## 2012-03-22 NOTE — Telephone Encounter (Signed)
Ok HIV test Thx 

## 2012-03-22 NOTE — Telephone Encounter (Signed)
Left msg on triage stating coming in next week for labs. Have appt to see md on 03/30/12. Wanting to get a Hiv test added to her blood work...03/22/12@3 :06pm/LMB

## 2012-03-23 NOTE — Telephone Encounter (Signed)
Called pt no answer LMOM lab has been entered... 03/23/12@9 :09am/LMB

## 2012-03-26 ENCOUNTER — Ambulatory Visit: Payer: 59

## 2012-03-26 DIAGNOSIS — D649 Anemia, unspecified: Secondary | ICD-10-CM

## 2012-03-26 DIAGNOSIS — Z114 Encounter for screening for human immunodeficiency virus [HIV]: Secondary | ICD-10-CM

## 2012-03-26 DIAGNOSIS — E119 Type 2 diabetes mellitus without complications: Secondary | ICD-10-CM

## 2012-03-26 DIAGNOSIS — E538 Deficiency of other specified B group vitamins: Secondary | ICD-10-CM

## 2012-03-26 DIAGNOSIS — E559 Vitamin D deficiency, unspecified: Secondary | ICD-10-CM

## 2012-03-26 LAB — HEPATIC FUNCTION PANEL
AST: 13 U/L (ref 0–37)
Bilirubin, Direct: 0.1 mg/dL (ref 0.0–0.3)
Total Bilirubin: 0.4 mg/dL (ref 0.3–1.2)

## 2012-03-26 LAB — CBC WITH DIFFERENTIAL/PLATELET
Basophils Relative: 0.4 % (ref 0.0–3.0)
Eosinophils Absolute: 0.1 10*3/uL (ref 0.0–0.7)
Hemoglobin: 9.5 g/dL — ABNORMAL LOW (ref 12.0–15.0)
Lymphocytes Relative: 23.9 % (ref 12.0–46.0)
MCHC: 31.1 g/dL (ref 30.0–36.0)
Monocytes Relative: 7 % (ref 3.0–12.0)
Neutro Abs: 5.7 10*3/uL (ref 1.4–7.7)
RBC: 4.04 Mil/uL (ref 3.87–5.11)

## 2012-03-26 LAB — BASIC METABOLIC PANEL
BUN: 14 mg/dL (ref 6–23)
CO2: 27 mEq/L (ref 19–32)
Chloride: 100 mEq/L (ref 96–112)
Glucose, Bld: 92 mg/dL (ref 70–99)
Potassium: 4.1 mEq/L (ref 3.5–5.1)

## 2012-03-26 LAB — IBC PANEL: Saturation Ratios: 7.1 % — ABNORMAL LOW (ref 20.0–50.0)

## 2012-03-26 LAB — HEMOGLOBIN A1C: Hgb A1c MFr Bld: 7 % — ABNORMAL HIGH (ref 4.6–6.5)

## 2012-03-26 LAB — TSH: TSH: 1.67 u[IU]/mL (ref 0.35–5.50)

## 2012-03-30 ENCOUNTER — Encounter: Payer: Self-pay | Admitting: Internal Medicine

## 2012-03-30 ENCOUNTER — Ambulatory Visit (INDEPENDENT_AMBULATORY_CARE_PROVIDER_SITE_OTHER): Payer: 59 | Admitting: Internal Medicine

## 2012-03-30 VITALS — BP 146/82 | HR 105 | Temp 98.8°F | Resp 16 | Wt 285.2 lb

## 2012-03-30 DIAGNOSIS — D509 Iron deficiency anemia, unspecified: Secondary | ICD-10-CM

## 2012-03-30 DIAGNOSIS — E119 Type 2 diabetes mellitus without complications: Secondary | ICD-10-CM

## 2012-03-30 DIAGNOSIS — E538 Deficiency of other specified B group vitamins: Secondary | ICD-10-CM

## 2012-03-30 DIAGNOSIS — K219 Gastro-esophageal reflux disease without esophagitis: Secondary | ICD-10-CM

## 2012-03-30 DIAGNOSIS — L74519 Primary focal hyperhidrosis, unspecified: Secondary | ICD-10-CM

## 2012-03-30 DIAGNOSIS — E669 Obesity, unspecified: Secondary | ICD-10-CM

## 2012-03-30 DIAGNOSIS — G4733 Obstructive sleep apnea (adult) (pediatric): Secondary | ICD-10-CM

## 2012-03-30 DIAGNOSIS — L538 Other specified erythematous conditions: Secondary | ICD-10-CM

## 2012-03-30 DIAGNOSIS — R61 Generalized hyperhidrosis: Secondary | ICD-10-CM | POA: Insufficient documentation

## 2012-03-30 DIAGNOSIS — L304 Erythema intertrigo: Secondary | ICD-10-CM | POA: Insufficient documentation

## 2012-03-30 MED ORDER — ALUMINUM CHLORIDE 20 % EX SOLN: Freq: Every day | CUTANEOUS | Status: AC

## 2012-03-30 MED ORDER — KETOCONAZOLE 2 % EX CREA: 1.0000 "application " | TOPICAL_CREAM | Freq: Two times a day (BID) | CUTANEOUS | Status: DC

## 2012-03-30 NOTE — Assessment & Plan Note (Signed)
Ketocon cream bid

## 2012-03-30 NOTE — Assessment & Plan Note (Signed)
Doing well 

## 2012-03-30 NOTE — Assessment & Plan Note (Signed)
Diet discussed 

## 2012-03-30 NOTE — Assessment & Plan Note (Signed)
Continue with current CPAP  

## 2012-03-30 NOTE — Assessment & Plan Note (Signed)
Continue with current prescription therapy as reflected on the Med list.  

## 2012-03-30 NOTE — Assessment & Plan Note (Signed)
Labs discussed  

## 2012-03-30 NOTE — Assessment & Plan Note (Signed)
Drysol 

## 2012-03-30 NOTE — Assessment & Plan Note (Signed)
Hem consult to consider IV iron

## 2012-03-30 NOTE — Progress Notes (Signed)
   Subjective:    Patient ID: Julia Estrada, female    DOB: 12/30/1970, 41 y.o.   MRN: [id number]  Shortness of Breath Pertinent negatives include no abdominal pain, headaches or rash.  C/o her breath cuts off when she is putting her chin down... She went to see ENT and was told it was GERD. Sx resolved The patient presents for a follow-up of  chronic hypertension, chronic dyslipidemia, type 2 diabetes controlled with medicines. Stress is better. F/u anemia    Review of Systems  Constitutional: Negative for chills, activity change, appetite change, fatigue and unexpected weight change.  HENT: Negative for congestion, mouth sores and sinus pressure.   Eyes: Negative for visual disturbance.  Respiratory: Positive for shortness of breath. Negative for cough and chest tightness.   Gastrointestinal: Negative for nausea and abdominal pain.  Genitourinary: Negative for frequency, difficulty urinating and vaginal pain.  Musculoskeletal: Negative for back pain and gait problem.  Skin: Negative for pallor and rash.  Neurological: Negative for dizziness, tremors, weakness, numbness and headaches.  Psychiatric/Behavioral: Negative for suicidal ideas, confusion and disturbed wake/sleep cycle. The patient is not nervous/anxious.    Wt Readings from Last 3 Encounters:  03/30/12 285 lb 4 oz (129.389 kg)  12/01/11 290 lb (131.543 kg)  08/12/11 282 lb (127.914 kg)   BP Readings from Last 3 Encounters:  03/30/12 146/82  03/09/12 128/76  12/01/11 122/88        Objective:   Physical Exam  Constitutional: She appears well-developed. No distress.       obese  HENT:  Head: Normocephalic.  Right Ear: External ear normal.  Left Ear: External ear normal.  Nose: Nose normal.  Mouth/Throat: Oropharynx is clear and moist.  Eyes: Conjunctivae are normal. Pupils are equal, round, and reactive to light. Right eye exhibits no discharge. Left eye exhibits no discharge.  Neck: Normal range of motion. Neck  supple. No JVD present. No tracheal deviation present. No thyromegaly present.  Cardiovascular: Normal rate, regular rhythm and normal heart sounds.   Pulmonary/Chest: No stridor. No respiratory distress. She has no wheezes.  Abdominal: Soft. Bowel sounds are normal. She exhibits no distension and no mass. There is no tenderness. There is no rebound and no guarding.  Musculoskeletal: She exhibits no edema and no tenderness.  Lymphadenopathy:    She has no cervical adenopathy.  Neurological: She displays normal reflexes. No cranial nerve deficit. She exhibits normal muscle tone. Coordination normal.  Skin: No rash noted. No erythema.  Psychiatric: She has a normal mood and affect. Her behavior is normal. Judgment and thought content normal.  No neck mass   Lab Results  Component Value Date   WBC 8.4 03/26/2012   HGB 9.5* 03/26/2012   HCT 30.5* 03/26/2012   PLT 416.0* 03/26/2012   GLUCOSE 92 03/26/2012   CHOL 165 12/14/2010   TRIG 142.0 12/14/2010   HDL 43.20 12/14/2010   LDLDIRECT 92.4 09/29/2008   LDLCALC 93 12/14/2010   ALT 19 03/26/2012   AST 13 03/26/2012   NA 137 03/26/2012   K 4.1 03/26/2012   CL 100 03/26/2012   CREATININE 0.6 03/26/2012   BUN 14 03/26/2012   CO2 27 03/26/2012   TSH 1.67 03/26/2012   HGBA1C 7.0* 03/26/2012        Assessment & Plan:

## 2012-04-03 ENCOUNTER — Other Ambulatory Visit: Payer: Self-pay | Admitting: Internal Medicine

## 2012-04-04 ENCOUNTER — Telehealth: Payer: Self-pay | Admitting: Oncology

## 2012-04-04 NOTE — Telephone Encounter (Signed)
C/D on 9/4 for appt 9/13

## 2012-04-04 NOTE — Telephone Encounter (Signed)
S/w pt.in re to NP appt on 9/13 @ 1:30 Ref. Dr. Posey Rea  Dx- anemia  Mailed np packet

## 2012-04-11 ENCOUNTER — Other Ambulatory Visit: Payer: Self-pay | Admitting: Oncology

## 2012-04-11 DIAGNOSIS — D509 Iron deficiency anemia, unspecified: Secondary | ICD-10-CM

## 2012-04-13 ENCOUNTER — Ambulatory Visit: Payer: 59

## 2012-04-13 ENCOUNTER — Ambulatory Visit (HOSPITAL_BASED_OUTPATIENT_CLINIC_OR_DEPARTMENT_OTHER): Payer: 59 | Admitting: Oncology

## 2012-04-13 ENCOUNTER — Other Ambulatory Visit (HOSPITAL_BASED_OUTPATIENT_CLINIC_OR_DEPARTMENT_OTHER): Payer: 59 | Admitting: Lab

## 2012-04-13 ENCOUNTER — Telehealth: Payer: Self-pay | Admitting: Oncology

## 2012-04-13 VITALS — BP 135/82 | HR 91 | Temp 98.8°F | Resp 22 | Ht 66.0 in | Wt 287.8 lb

## 2012-04-13 DIAGNOSIS — D509 Iron deficiency anemia, unspecified: Secondary | ICD-10-CM

## 2012-04-13 LAB — COMPREHENSIVE METABOLIC PANEL (CC13)
ALT: 20 U/L (ref 0–55)
BUN: 12 mg/dL (ref 7.0–26.0)
CO2: 22 mEq/L (ref 22–29)
Calcium: 9.2 mg/dL (ref 8.4–10.4)
Chloride: 105 mEq/L (ref 98–107)
Creatinine: 0.8 mg/dL (ref 0.6–1.1)
Glucose: 148 mg/dl — ABNORMAL HIGH (ref 70–99)

## 2012-04-13 LAB — CBC WITH DIFFERENTIAL/PLATELET
Basophils Absolute: 0 10*3/uL (ref 0.0–0.1)
EOS%: 1.9 % (ref 0.0–7.0)
HCT: 30.8 % — ABNORMAL LOW (ref 34.8–46.6)
HGB: 9.9 g/dL — ABNORMAL LOW (ref 11.6–15.9)
LYMPH%: 29.2 % (ref 14.0–49.7)
MCH: 24 pg — ABNORMAL LOW (ref 25.1–34.0)
MCV: 74.7 fL — ABNORMAL LOW (ref 79.5–101.0)
MONO%: 7.4 % (ref 0.0–14.0)
NEUT%: 61.2 % (ref 38.4–76.8)

## 2012-04-13 NOTE — Progress Notes (Signed)
CC:   Georgina Quint. Plotnikov, MD  REASON FOR CONSULTATION:  Anemia.  HISTORY OF PRESENT ILLNESS:  Ms. Julia Estrada is a pleasant 41 year old woman of [place], lived the majority of her life around this area.  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  She is a pleasant woman with a past medical history significant for obesity, hypertension and diabetes.  She also has had long-standing history of heavy menstrual cycles and iron-deficiency anemia.  She had described lengthy menstrual cycles as well as heavy menstrual cycles.  For that she has been taking oral contraceptives, which have helped with the duration of the menstrual cycles.  She has been on oral iron supplements.  She described taking 1 tablet over the counter for an extended period of time without really any major changes in her blood counts.  Her most recent CBC obtained by Dr. Posey Rea 08/26:  Her hemoglobin was 9.5, her MCV was 75, she has a slightly elevated platelets of 416.  Prior to that, her hemoglobin was 10 and had fluctuated as low as 9.7 back in August 2012, as high as 11 back in 2010.  Her last iron studies done on August 26th showed that her higher level was 42, which is borderline.  Iron saturation is still very low at 7.1.  Clinically Ms. Nylander reports some fatigue and tiredness, some occasional shortness of breath.  She did not report any GI symptoms. Did not report any GI bleeding.  Did not report any hematochezia.  Did not report any melena.  Did not report any epistaxis.  The rest of her review of systems:  Does not report any headaches, blurred vision, double vision.  Did not report any motor or sensory neuropathy.  Did not report any alteration in mental status.  Did not report any psychiatric issues, depression.  Did not report any fever chills sweats.  Does not report any cough, hemoptysis, hematemesis.  No nausea or vomiting.  Did not report any abdominal pain, hematochezia,  melena.  No genitourinary complaints.  Rest of her review of systems is unremarkable.  PAST MEDICAL HISTORY:  Significant for obesity, obstructive sleep apnea, history of hypertension, diabetes, GERD.  Has history of anemia, depression and vertigo.  MEDICATION:  She is on Valtrex, Aldactone, Zantac, Protonix, oral contraceptives, metformin, ketoconazole cream, Januvia, ibuprofen, iron sulfate, vitamin B12, vitamin D, and aluminum chloride solution.  ALLERGIES:  Allergic to codeine, hydrochlorothiazide and oxycodone.  SOCIAL HISTORY:  She is divorced.  Lives with her parents.  She has a 38- year-old.  Denied any alcohol or tobacco abuse.  FAMILY HISTORY:  Her father is in excellent health.  Mother has history of diabetes and kidney disease.  Her sister had anemia and was treated with IV iron, although there was no clear-cut history of hemoglobinopathy such as sickle cell or thalassemia.  PHYSICAL EXAMINATION:  Alert, awake, pleasant woman, appeared in no active distress.  Her blood pressure is 135/82, pulse 91, respiration 22, temperature is 98.  Weight is 287 pounds.  ECOG performance status is 1.  HEENT:  Head is normocephalic, atraumatic.  Pupils equal and round, reactive to light.  Oral mucosa moist and pink.  Neck:  Supple without lymphadenopathy.  Heart:  Regular rate and rhythm.  S1 and S2. Lungs:  Clear to auscultation.  No rhonchi, wheeze, dullness to percussion.  Abdomen:  Soft, nontender.  No hepatosplenomegaly. Extremities:  No clubbing, cyanosis or edema.  Neurologic:  Intact motor, sensory and deep tendon reflexes.  LABORATORY DATA TODAY:  Hemoglobin of 9.9, white cell count of 7.7, platelet count is 331, her MCV is 74.7, her RDW at 17.5.  ASSESSMENT AND PLAN:  A 41 year old woman with the following issue: Hypochromic, microcytic anemia, undoubtedly related to iron deficiency due to heavy menstrual cycles.  She has been on oral iron replacement but appears to have  really plateaued effect as we can see that her hemoglobin is still at around 9 and 10.  She is still mildly symptomatic with fatigue and tiredness and exertional dyspnea.  I have talked to her about iron replacement strategies including increasing her oral iron versus using IV iron.  Risks and benefits of both approaches were discussed.  She is favoring using IV iron at this time.  I have talked to her about different formulations including Feraheme versus iron dextran versus and iron sucrose.  At this time she is agreeable to proceed with Feraheme.  Toxicities that include infusion-related problems, arthralgias, myalgias, a rash, anaphylaxis are extremely uncommon.  She is agreeable to proceed and after she completes the IV iron, I will recheck her iron stores in about 3 months and assess her response at that time.  I also will check a hemoglobin electrophoresis on her as well to make sure she does not have any hemoglobinopathy as well.  All her questions were answered.    ______________________________ Benjiman Core, M.D. FNS/MEDQ  D:  04/13/2012  T:  04/13/2012  Job:  [id number]

## 2012-04-13 NOTE — Progress Notes (Signed)
Note dictated

## 2012-04-13 NOTE — Telephone Encounter (Signed)
gve the pt her sept,dec 2013 appt calendar

## 2012-04-17 LAB — HEMOGLOBINOPATHY EVALUATION: Hgb F Quant: 0 % (ref 0.0–2.0)

## 2012-04-17 LAB — IRON AND TIBC
Iron: 37 ug/dL — ABNORMAL LOW (ref 42–145)
TIBC: 516 ug/dL — ABNORMAL HIGH (ref 250–470)
UIBC: 479 ug/dL — ABNORMAL HIGH (ref 125–400)

## 2012-04-17 LAB — FERRITIN: Ferritin: 20 ng/mL (ref 10–291)

## 2012-04-27 ENCOUNTER — Ambulatory Visit (HOSPITAL_BASED_OUTPATIENT_CLINIC_OR_DEPARTMENT_OTHER): Payer: 59

## 2012-04-27 VITALS — BP 129/72 | HR 86 | Temp 97.4°F | Resp 20

## 2012-04-27 DIAGNOSIS — F3289 Other specified depressive episodes: Secondary | ICD-10-CM

## 2012-04-27 DIAGNOSIS — H6690 Otitis media, unspecified, unspecified ear: Secondary | ICD-10-CM

## 2012-04-27 DIAGNOSIS — R0689 Other abnormalities of breathing: Secondary | ICD-10-CM

## 2012-04-27 DIAGNOSIS — G4733 Obstructive sleep apnea (adult) (pediatric): Secondary | ICD-10-CM

## 2012-04-27 DIAGNOSIS — E559 Vitamin D deficiency, unspecified: Secondary | ICD-10-CM

## 2012-04-27 DIAGNOSIS — R209 Unspecified disturbances of skin sensation: Secondary | ICD-10-CM

## 2012-04-27 DIAGNOSIS — L304 Erythema intertrigo: Secondary | ICD-10-CM

## 2012-04-27 DIAGNOSIS — R1012 Left upper quadrant pain: Secondary | ICD-10-CM

## 2012-04-27 DIAGNOSIS — R42 Dizziness and giddiness: Secondary | ICD-10-CM

## 2012-04-27 DIAGNOSIS — E119 Type 2 diabetes mellitus without complications: Secondary | ICD-10-CM

## 2012-04-27 DIAGNOSIS — R110 Nausea: Secondary | ICD-10-CM

## 2012-04-27 DIAGNOSIS — F329 Major depressive disorder, single episode, unspecified: Secondary | ICD-10-CM

## 2012-04-27 DIAGNOSIS — L83 Acanthosis nigricans: Secondary | ICD-10-CM

## 2012-04-27 DIAGNOSIS — E669 Obesity, unspecified: Secondary | ICD-10-CM

## 2012-04-27 DIAGNOSIS — L659 Nonscarring hair loss, unspecified: Secondary | ICD-10-CM

## 2012-04-27 DIAGNOSIS — R0600 Dyspnea, unspecified: Secondary | ICD-10-CM

## 2012-04-27 DIAGNOSIS — K219 Gastro-esophageal reflux disease without esophagitis: Secondary | ICD-10-CM

## 2012-04-27 DIAGNOSIS — R61 Generalized hyperhidrosis: Secondary | ICD-10-CM

## 2012-04-27 DIAGNOSIS — R030 Elevated blood-pressure reading, without diagnosis of hypertension: Secondary | ICD-10-CM

## 2012-04-27 DIAGNOSIS — R635 Abnormal weight gain: Secondary | ICD-10-CM

## 2012-04-27 DIAGNOSIS — N3090 Cystitis, unspecified without hematuria: Secondary | ICD-10-CM

## 2012-04-27 DIAGNOSIS — R1013 Epigastric pain: Secondary | ICD-10-CM

## 2012-04-27 DIAGNOSIS — E538 Deficiency of other specified B group vitamins: Secondary | ICD-10-CM

## 2012-04-27 DIAGNOSIS — H6990 Unspecified Eustachian tube disorder, unspecified ear: Secondary | ICD-10-CM

## 2012-04-27 DIAGNOSIS — D509 Iron deficiency anemia, unspecified: Secondary | ICD-10-CM

## 2012-04-27 DIAGNOSIS — R079 Chest pain, unspecified: Secondary | ICD-10-CM

## 2012-04-27 MED ORDER — SODIUM CHLORIDE 0.9 % IV SOLN
1020.0000 mg | Freq: Once | INTRAVENOUS | Status: AC
  Administered 2012-04-27: 1020 mg via INTRAVENOUS
  Filled 2012-04-27: qty 34

## 2012-04-27 MED ORDER — SODIUM CHLORIDE 0.9 % IV SOLN
Freq: Once | INTRAVENOUS | Status: AC
  Administered 2012-04-27: 09:00:00 via INTRAVENOUS

## 2012-04-27 NOTE — Patient Instructions (Addendum)
[facility] Discharge Instructions for Patients Today you received the following: feraheme  BELOW ARE SYMPTOMS THAT SHOULD BE REPORTED IMMEDIATELY:  *FEVER GREATER THAN 100.5 F  *CHILLS WITH OR WITHOUT FEVER  NAUSEA AND VOMITING THAT IS NOT CONTROLLED WITH YOUR NAUSEA MEDICATION  *UNUSUAL SHORTNESS OF BREATH  *UNUSUAL BRUISING OR BLEEDING  TENDERNESS IN MOUTH AND THROAT WITH OR WITHOUT PRESENCE OF ULCERS  *URINARY PROBLEMS  *BOWEL PROBLEMS  UNUSUAL RASH Items with * indicate a potential emergency and should be followed up as soon as possible.  One of the nurses will contact you 24 hours after your treatment. Please let the nurse know about any problems that you may have experienced. Feel free to call the clinic you have any questions or concerns. The clinic phone number is [phone].   I have been informed and understand all the instructions given to me. I know to contact the clinic, my physician, or go to the Emergency Department if any problems should occur. I do not have any questions at this time, but understand that I may call the clinic during office hours   should I have any questions or need assistance in obtaining follow up care.    __________________________________________  _____________  __________ Signature of Patient or Authorized Representative            Date                   Time    __________________________________________ Nurse's Signature

## 2012-05-15 ENCOUNTER — Other Ambulatory Visit: Payer: Self-pay | Admitting: Internal Medicine

## 2012-06-26 ENCOUNTER — Other Ambulatory Visit: Payer: Self-pay | Admitting: Internal Medicine

## 2012-06-26 ENCOUNTER — Ambulatory Visit (INDEPENDENT_AMBULATORY_CARE_PROVIDER_SITE_OTHER): Payer: Self-pay | Admitting: Family Medicine

## 2012-06-26 DIAGNOSIS — E119 Type 2 diabetes mellitus without complications: Secondary | ICD-10-CM

## 2012-06-26 NOTE — Progress Notes (Signed)
Patient presents today for 3 month DM follow-up as part of the employer-sponsored Link to Wellness program.  Medications and glucose readings have been reviewed.  I have also discussed with the patient lifestyle intervention such as diet and exercise.  Details of this visit can be found in [employer] documenting program through [employer] [facility]).  Patient has set a series of personal goals and will follow-up in 3 months for further review of DM.

## 2012-07-03 ENCOUNTER — Encounter: Payer: Self-pay | Admitting: Family Medicine

## 2012-07-03 NOTE — Progress Notes (Signed)
Patient ID: Julia Estrada, female   DOB: Dec 22, 1970, 41 y.o.   MRN: [id number] Reviewed and agree with this documentation and management.

## 2012-07-13 ENCOUNTER — Ambulatory Visit (HOSPITAL_BASED_OUTPATIENT_CLINIC_OR_DEPARTMENT_OTHER): Payer: 59 | Admitting: Oncology

## 2012-07-13 ENCOUNTER — Encounter: Payer: Self-pay | Admitting: Oncology

## 2012-07-13 ENCOUNTER — Telehealth: Payer: Self-pay | Admitting: Oncology

## 2012-07-13 ENCOUNTER — Other Ambulatory Visit (HOSPITAL_BASED_OUTPATIENT_CLINIC_OR_DEPARTMENT_OTHER): Payer: 59 | Admitting: Lab

## 2012-07-13 ENCOUNTER — Ambulatory Visit: Payer: 59 | Admitting: Oncology

## 2012-07-13 VITALS — BP 127/81 | HR 83 | Temp 97.4°F | Resp 22 | Ht 66.0 in | Wt 291.8 lb

## 2012-07-13 DIAGNOSIS — D509 Iron deficiency anemia, unspecified: Secondary | ICD-10-CM

## 2012-07-13 DIAGNOSIS — R5383 Other fatigue: Secondary | ICD-10-CM

## 2012-07-13 DIAGNOSIS — E119 Type 2 diabetes mellitus without complications: Secondary | ICD-10-CM

## 2012-07-13 LAB — CBC WITH DIFFERENTIAL/PLATELET
Basophils Absolute: 0 10*3/uL (ref 0.0–0.1)
Eosinophils Absolute: 0.2 10*3/uL (ref 0.0–0.5)
HCT: 32.3 % — ABNORMAL LOW (ref 34.8–46.6)
HGB: 10.5 g/dL — ABNORMAL LOW (ref 11.6–15.9)
LYMPH%: 29.9 % (ref 14.0–49.7)
MCV: 79.7 fL (ref 79.5–101.0)
MONO#: 0.6 10*3/uL (ref 0.1–0.9)
NEUT#: 5.6 10*3/uL (ref 1.5–6.5)
Platelets: 355 10*3/uL (ref 145–400)
RBC: 4.06 10*6/uL (ref 3.70–5.45)
WBC: 9.2 10*3/uL (ref 3.9–10.3)

## 2012-07-13 LAB — FERRITIN: Ferritin: 138 ng/mL (ref 10–291)

## 2012-07-13 LAB — IRON AND TIBC
%SAT: 7 % — ABNORMAL LOW (ref 20–55)
TIBC: 474 ug/dL — ABNORMAL HIGH (ref 250–470)

## 2012-07-13 NOTE — Progress Notes (Signed)
Hematology and Oncology Follow Up Visit  Julia Estrada [id number] 1971/06/10 41 y.o. 07/13/2012 8:16 PM Sonda Primes, MDPlotnikov, Georgina Quint, MD   Principle Diagnosis: Iron deficiency anemia  Prior Therapy: Feraheme 1,020 mg IV on 04/27/12  Current therapy: Ferrous sulfate 325 mg PO daily.  Interim History:  Julia Estrada is a 41 year old female seen today for follow-up of iron deficiency anemia. Received Feraheme about 3 months ago. She did not have any infusion reaction or side effects from IV iron. Remains or oral iron. States she has not noticed much of a improvement in her energy level. Denies chest pain, shortness of breath, and dyspnea. She has regular periods, but think that they are lighter than before her iron infusion. She has not noticed any other bleeding.  Medications: I have reviewed the patient's current medications. Current outpatient prescriptions:aluminum chloride (DRYSOL) 20 % external solution, Apply topically at bedtime., Disp: 60 mL, Rfl: 3;  cholecalciferol (VITAMIN D) 1000 UNITS tablet, Take 1,000 Units by mouth daily., Disp: , Rfl: ;  Cyanocobalamin (VITAMIN B-12) 1000 MCG SUBL, Place 1 tablet (1,000 mcg total) under the tongue daily., Disp: 100 tablet, Rfl: 3;  Cyanocobalamin 1000 MCG SUBL, Place 1 tablet under the tongue daily., Disp: , Rfl:  ferrous sulfate 325 (65 FE) MG tablet, Take 325 mg by mouth daily with breakfast., Disp: , Rfl: ;  ibuprofen (ADVIL,MOTRIN) 800 MG tablet, Take 800 mg by mouth as needed.  , Disp: , Rfl: ;  JANUVIA 100 MG tablet, TAKE 1 TABLET BY MOUTH DAILY, Disp: 90 tablet, Rfl: 3;  ketoconazole (NIZORAL) 2 % cream, Apply 1 application topically 2 (two) times daily., Disp: 120 g, Rfl: 3 metFORMIN (GLUCOPHAGE-XR) 500 MG 24 hr tablet, TAKE 2 TABLETS BY MOUTH TWICE DAILY WITH FOOD, Disp: 360 tablet, Rfl: 3;  norethindrone-ethinyl estradiol (NECON) 0.5-35 MG-MCG per tablet, Take 1 tablet by mouth daily.  , Disp: , Rfl: ;  pantoprazole (PROTONIX) 40 MG tablet,  Take 40 mg by mouth daily., Disp: , Rfl: ;  ranitidine (ZANTAC) 300 MG tablet, TAKE 1 TABLET BY MOUTH AT BEDTIME., Disp: 90 tablet, Rfl: 2 spironolactone (ALDACTONE) 100 MG tablet, Take 100 mg by mouth daily., Disp: , Rfl: ;  valACYclovir (VALTREX) 1000 MG tablet, Take 1,000 mg by mouth daily.  , Disp: , Rfl:   Allergies:  Allergies  Allergen Reactions  . Codeine Sulfate Nausea And Vomiting  . Hydrochlorothiazide W-Triamterene     REACTION: cramping  . Oxycodone-Acetaminophen Nausea And Vomiting    Past Medical History, Surgical history, Social history, and Family History were reviewed and updated.  Review of Systems: Constitutional:  Negative for fever, chills, night sweats, anorexia, weight loss, pain. Cardiovascular: no chest pain or dyspnea on exertion Respiratory: no cough, shortness of breath, or wheezing Neurological: no TIA or stroke symptoms Dermatological: negative ENT: negative Skin: Negative. Gastrointestinal: no abdominal pain, change in bowel habits, or black or bloody stools Genito-Urinary: no dysuria, trouble voiding, or hematuria Hematological and Lymphatic: negative Breast: negative for breast lumps Musculoskeletal: negative Remaining ROS negative.  Physical Exam: Blood pressure 127/81, pulse 83, temperature 97.4 F (36.3 C), temperature source Oral, resp. rate 22, height 5\' 6"  (1.676 m), weight 291 lb 12.8 oz (132.36 kg). ECOG: 0-1 General appearance: alert, cooperative and no distress Head: Normocephalic, without obvious abnormality, atraumatic Neck: no adenopathy, no carotid bruit, no JVD, supple, symmetrical, trachea midline and thyroid not enlarged, symmetric, no tenderness/mass/nodules Lymph nodes: Cervical, supraclavicular, and axillary nodes normal. Heart:regular rate and rhythm,  S1, S2 normal, no murmur, click, rub or gallop Lung:chest clear, no wheezing, rales, normal symmetric air entry, no tachypnea, retractions or cyanosis Abdomen: soft,  non-tender, without masses or organomegaly EXT:no erythema, induration, or nodules   Lab Results: Lab Results  Component Value Date   WBC 9.2 07/13/2012   HGB 10.5* 07/13/2012   HCT 32.3* 07/13/2012   MCV 79.7 07/13/2012   PLT 355 07/13/2012     Chemistry      Component Value Date/Time   NA 139 04/13/2012 1345   NA 137 03/26/2012 0824   K 3.7 04/13/2012 1345   K 4.1 03/26/2012 0824   CL 105 04/13/2012 1345   CL 100 03/26/2012 0824   CO2 22 04/13/2012 1345   CO2 27 03/26/2012 0824   BUN 12.0 04/13/2012 1345   BUN 14 03/26/2012 0824   CREATININE 0.8 04/13/2012 1345   CREATININE 0.6 03/26/2012 0824      Component Value Date/Time   CALCIUM 9.2 04/13/2012 1345   CALCIUM 9.2 03/26/2012 0824   ALKPHOS 103 04/13/2012 1345   ALKPHOS 96 03/26/2012 0824   AST 10 04/13/2012 1345   AST 13 03/26/2012 0824   ALT 20 04/13/2012 1345   ALT 19 03/26/2012 0824   BILITOT 0.50 04/13/2012 1345   BILITOT 0.4 03/26/2012 0824     Impression and Plan:  This is a 41 year old female with the following issues:  1. Iron deficiency anemia. S/P Feraheme with improvement in her Hemoglobin. She has fatigue, but is otherwise asymptomatic from her anemia. No transfusion is indicated. She remains on oral iron and I have encouraged her to continue this.I have recommended taking Vitamin C to increase the absorption of the iron. Iron studies are pending today. She may need additional IV iron pending these results.  2. Diabetes mellitus: On Januvia and Metformin per PCP.  3. Follow-up. In about 3 months.  Spent more than half the time coordinating care.    [name] 12/13/20138:16 PM

## 2012-07-13 NOTE — Telephone Encounter (Signed)
appt made and printed for pt aom °

## 2012-07-20 ENCOUNTER — Other Ambulatory Visit (INDEPENDENT_AMBULATORY_CARE_PROVIDER_SITE_OTHER): Payer: 59

## 2012-07-20 DIAGNOSIS — L538 Other specified erythematous conditions: Secondary | ICD-10-CM

## 2012-07-20 DIAGNOSIS — L74519 Primary focal hyperhidrosis, unspecified: Secondary | ICD-10-CM

## 2012-07-20 DIAGNOSIS — D509 Iron deficiency anemia, unspecified: Secondary | ICD-10-CM

## 2012-07-20 DIAGNOSIS — L304 Erythema intertrigo: Secondary | ICD-10-CM

## 2012-07-20 DIAGNOSIS — E669 Obesity, unspecified: Secondary | ICD-10-CM

## 2012-07-20 DIAGNOSIS — R61 Generalized hyperhidrosis: Secondary | ICD-10-CM

## 2012-07-20 LAB — BASIC METABOLIC PANEL
Calcium: 9.3 mg/dL (ref 8.4–10.5)
Chloride: 103 mEq/L (ref 96–112)
Creatinine, Ser: 0.7 mg/dL (ref 0.4–1.2)
Sodium: 138 mEq/L (ref 135–145)

## 2012-07-20 LAB — HEMOGLOBIN A1C: Hgb A1c MFr Bld: 7.6 % — ABNORMAL HIGH (ref 4.6–6.5)

## 2012-07-20 LAB — LIPID PANEL
Cholesterol: 187 mg/dL (ref 0–200)
Triglycerides: 176 mg/dL — ABNORMAL HIGH (ref 0.0–149.0)

## 2012-07-23 ENCOUNTER — Telehealth: Payer: Self-pay | Admitting: Internal Medicine

## 2012-07-23 ENCOUNTER — Ambulatory Visit (INDEPENDENT_AMBULATORY_CARE_PROVIDER_SITE_OTHER): Payer: 59 | Admitting: Internal Medicine

## 2012-07-23 ENCOUNTER — Encounter: Payer: Self-pay | Admitting: Internal Medicine

## 2012-07-23 VITALS — BP 120/80 | HR 80 | Temp 98.2°F | Resp 16 | Wt 290.0 lb

## 2012-07-23 DIAGNOSIS — E538 Deficiency of other specified B group vitamins: Secondary | ICD-10-CM

## 2012-07-23 DIAGNOSIS — R0609 Other forms of dyspnea: Secondary | ICD-10-CM

## 2012-07-23 DIAGNOSIS — R0600 Dyspnea, unspecified: Secondary | ICD-10-CM

## 2012-07-23 DIAGNOSIS — R635 Abnormal weight gain: Secondary | ICD-10-CM

## 2012-07-23 DIAGNOSIS — E669 Obesity, unspecified: Secondary | ICD-10-CM

## 2012-07-23 DIAGNOSIS — E119 Type 2 diabetes mellitus without complications: Secondary | ICD-10-CM

## 2012-07-23 DIAGNOSIS — E559 Vitamin D deficiency, unspecified: Secondary | ICD-10-CM

## 2012-07-23 MED ORDER — LIRAGLUTIDE 18 MG/3ML ~~LOC~~ SOLN: 1.8000 mg | SUBCUTANEOUS | Status: DC

## 2012-07-23 MED ORDER — FLUTICASONE-SALMETEROL 100-50 MCG/DOSE IN AEPB: 1.0000 | INHALATION_SPRAY | Freq: Two times a day (BID) | RESPIRATORY_TRACT | Status: DC

## 2012-07-23 NOTE — Telephone Encounter (Signed)
Stacey, please, inform patient that her sugar needs to be better controlled - pls sch an OV Thx

## 2012-07-23 NOTE — Assessment & Plan Note (Signed)
Will try Advair

## 2012-07-23 NOTE — Assessment & Plan Note (Signed)
Continue with current prescription therapy as reflected on the Med list.  

## 2012-07-23 NOTE — Assessment & Plan Note (Signed)
D/c Januvia Start victoza

## 2012-07-23 NOTE — Progress Notes (Signed)
Patient ID: Julia Estrada, female   DOB: 1970/08/05, 41 y.o.   MRN: [id number]   Subjective:    Patient ID: Julia Estrada, female    DOB: 05-02-1971, 41 y.o.   MRN: [id number]  Shortness of Breath This is a recurrent problem. The current episode started 1 to 4 weeks ago. The problem has been unchanged. Pertinent negatives include no abdominal pain, headaches or rash. The symptoms are aggravated by exercise. The patient has no known risk factors for DVT/PE. She has tried nothing for the symptoms. Her past medical history is significant for asthma.  F/u on her breath cuts off when she is putting her chin down... She went to see ENT and was told it was GERD. Sx resolved The patient presents for a follow-up of  chronic hypertension, chronic dyslipidemia, type 2 diabetes controlled with medicines. Stress is better. F/u anemia    Review of Systems  Constitutional: Negative for chills, activity change, appetite change, fatigue and unexpected weight change.  HENT: Negative for congestion, mouth sores and sinus pressure.   Eyes: Negative for visual disturbance.  Respiratory: Positive for shortness of breath. Negative for cough and chest tightness.   Gastrointestinal: Negative for nausea and abdominal pain.  Genitourinary: Negative for frequency, difficulty urinating and vaginal pain.  Musculoskeletal: Negative for back pain and gait problem.  Skin: Negative for pallor and rash.  Neurological: Negative for dizziness, tremors, weakness, numbness and headaches.  Psychiatric/Behavioral: Negative for suicidal ideas, confusion and sleep disturbance. The patient is not nervous/anxious.    Wt Readings from Last 3 Encounters:  07/23/12 290 lb (131.543 kg)  07/13/12 291 lb 12.8 oz (132.36 kg)  04/13/12 287 lb 12.8 oz (130.545 kg)   BP Readings from Last 3 Encounters:  07/23/12 120/80  07/13/12 127/81  04/27/12 129/72        Objective:   Physical Exam  Constitutional: She appears well-developed. No  distress.       obese  HENT:  Head: Normocephalic.  Right Ear: External ear normal.  Left Ear: External ear normal.  Nose: Nose normal.  Mouth/Throat: Oropharynx is clear and moist.  Eyes: Conjunctivae normal are normal. Pupils are equal, round, and reactive to light. Right eye exhibits no discharge. Left eye exhibits no discharge.  Neck: Normal range of motion. Neck supple. No JVD present. No tracheal deviation present. No thyromegaly present.  Cardiovascular: Normal rate, regular rhythm and normal heart sounds.   Pulmonary/Chest: No stridor. No respiratory distress. She has no wheezes.  Abdominal: Soft. Bowel sounds are normal. She exhibits no distension and no mass. There is no tenderness. There is no rebound and no guarding.  Musculoskeletal: She exhibits no edema and no tenderness.  Lymphadenopathy:    She has no cervical adenopathy.  Neurological: She displays normal reflexes. No cranial nerve deficit. She exhibits normal muscle tone. Coordination normal.  Skin: No rash noted. No erythema.  Psychiatric: She has a normal mood and affect. Her behavior is normal. Judgment and thought content normal.  No neck mass   Lab Results  Component Value Date   WBC 9.2 07/13/2012   HGB 10.5* 07/13/2012   HCT 32.3* 07/13/2012   PLT 355 07/13/2012   GLUCOSE 105* 07/20/2012   CHOL 187 07/20/2012   TRIG 176.0* 07/20/2012   HDL 47.10 07/20/2012   LDLDIRECT 92.4 09/29/2008   LDLCALC 105* 07/20/2012   ALT 20 04/13/2012   AST 10 04/13/2012   NA 138 07/20/2012   K 4.0 07/20/2012   CL 103  07/20/2012   CREATININE 0.7 07/20/2012   BUN 12 07/20/2012   CO2 28 07/20/2012   TSH 1.67 03/26/2012   HGBA1C 7.6* 07/20/2012   I personally provided the advair use teaching. After the teaching patient was able to demonstrate it's use effectively. All questions were answered      Assessment & Plan:

## 2012-07-23 NOTE — Telephone Encounter (Signed)
Pt informed

## 2012-07-23 NOTE — Assessment & Plan Note (Signed)
Start victoza.  

## 2012-07-23 NOTE — Assessment & Plan Note (Signed)
Wt Readings from Last 3 Encounters:  07/23/12 290 lb (131.543 kg)  07/13/12 291 lb 12.8 oz (132.36 kg)  04/13/12 287 lb 12.8 oz (130.545 kg)

## 2012-08-14 ENCOUNTER — Telehealth: Payer: Self-pay | Admitting: Internal Medicine

## 2012-08-14 NOTE — Telephone Encounter (Signed)
Julia Estrada reports stopped ~ 1 week ago taking Victova for DM, made her sick: nausea and diarrhea; has gone back to same dosage of Genuvia she used before;   Blood sugars are under control.

## 2012-08-15 MED ORDER — SITAGLIPTIN PHOSPHATE 100 MG PO TABS: 100.0000 mg | ORAL_TABLET | Freq: Every day | ORAL | Status: DC

## 2012-08-15 NOTE — Telephone Encounter (Signed)
Noted. Agree Thx! 

## 2012-09-04 ENCOUNTER — Other Ambulatory Visit: Payer: Self-pay | Admitting: Internal Medicine

## 2012-09-14 ENCOUNTER — Emergency Department (HOSPITAL_COMMUNITY)
Admission: EM | Admit: 2012-09-14 | Discharge: 2012-09-14 | Disposition: A | Discharge status: Home / Self Care | Payer: 59 | Source: Home / Self Care | Attending: Emergency Medicine | Admitting: Emergency Medicine

## 2012-09-14 ENCOUNTER — Encounter (HOSPITAL_COMMUNITY): Payer: Self-pay | Admitting: Emergency Medicine

## 2012-09-14 DIAGNOSIS — IMO0002 Reserved for concepts with insufficient information to code with codable children: Secondary | ICD-10-CM

## 2012-09-14 MED ORDER — IBUPROFEN 800 MG PO TABS
800.0000 mg | ORAL_TABLET | Freq: Once | ORAL | Status: AC
  Administered 2012-09-14: 800 mg via ORAL

## 2012-09-14 MED ORDER — IBUPROFEN 800 MG PO TABS
ORAL_TABLET | ORAL | Status: AC
  Filled 2012-09-14: qty 1

## 2012-09-14 MED ORDER — SULFAMETHOXAZOLE-TMP DS 800-160 MG PO TABS: 2.0000 | ORAL_TABLET | Freq: Two times a day (BID) | ORAL | Status: DC

## 2012-09-14 NOTE — ED Notes (Signed)
Pt c/o pinky finger of the left hand had pain x 1 wk. Tuesday of this week swelling started. Pt denies injury.  Pt has used warm compresses and ibuprofen with no relief in symptoms.

## 2012-09-14 NOTE — ED Provider Notes (Signed)
Chief Complaint  Patient presents with  . Hand Pain    left hand pinky finger swollen    History of Present Illness:   Julia Estrada is a 42 year old female who presents with a one-week history of a paronychia on her left little finger, on the ulnar nail fold. This is swollen, red, and tender to touch. It has not drained any pus. She's able to move the digit well and denies any numbness or tingling. There's been no fever or chills. 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 Amanda Pea in the past. She also has a history of diabetes, hypertension, and gastroesophageal reflux. She's on a number of medications for this. She has allergies to codeine and Percocet.  Review of Systems:  Other than noted above, the patient denies any of the following symptoms: Systemic:  No fevers, chills, sweats, or aches.  No fatigue or tiredness. Musculoskeletal:  No joint pain, arthritis, bursitis, swelling, back pain, or neck pain. Neurological:  No muscular weakness, paresthesias, headache, or trouble with speech or coordination.  No dizziness.  PMFSH:  Past medical history, family history, social history, meds, and allergies were reviewed.  Physical Exam:   Vital signs:  BP 150/77  Pulse 64  Temp(Src) 98.8 F (37.1 C) (Oral)  Resp 20  SpO2 98%  LMP 08/20/2012 Gen:  Alert and oriented times 3.  In no distress. Musculoskeletal: The all her nail fold of the left little finger was swollen, red, and tender. There did appear to be a collection of pus underneath the nail fold but not underneath the nail. All joints of the finger had a full range of motion actively and passively. There was no pain over the volar aspect of the finger.  Otherwise, all joints had a full a ROM with no swelling, bruising or deformity.  No edema, pulses full. Extremities were warm and pink.  Capillary refill was brisk.  Skin:  Clear, warm and dry.  No rash. Neuro:  Alert and oriented times 3.  Muscle strength was  normal.  Sensation was intact to light touch.   Procedure Note:  Verbal informed consent was obtained from the patient.  Risks and benefits were outlined with the patient.  Patient understands and accepts these risks.  Identity of the patient was confirmed verbally and by armband.    Procedure was performed as follows:  The finger was prepped with alcohol and using a #11 blade a small incision was made into the small collection of pus yielding a couple drops of pus. This was cultured. All of the pus was drained out and a sterile dressing was applied. The patient was given instructions and followup care.  Patient tolerated the procedure well without any immediate complications.  Course in Urgent Care Center:   She was given ibuprofen 800 mg by mouth for the pain since she is driving herself home.  Assessment:  The encounter diagnosis was Paronychia.  Plan:   1.  The following meds were prescribed:   New Prescriptions   SULFAMETHOXAZOLE-TRIMETHOPRIM (BACTRIM DS) 800-160 MG PER TABLET    Take 2 tablets by mouth 2 (two) times daily.   She'll take ibuprofen 800 mg every 8 hours as needed for the pain. The patient states she does not tolerate opioids well.  2.  The patient was instructed in symptomatic care, including rest and activity, elevation, application of heat and compression.  Appropriate handouts were given. 3.  The patient was told  to return if becoming worse in any way, if no better in 2 or 3 days, and given some red flag symptoms that would indicate earlier return.     Reuben Likes, MD 09/14/12 [phone]

## 2012-09-14 NOTE — ED Notes (Signed)
Waiting discharge papers 

## 2012-09-17 ENCOUNTER — Telehealth (HOSPITAL_COMMUNITY): Payer: Self-pay | Admitting: Emergency Medicine

## 2012-09-17 ENCOUNTER — Telehealth (HOSPITAL_COMMUNITY): Payer: Self-pay | Admitting: *Deleted

## 2012-09-17 LAB — CULTURE, ROUTINE-ABSCESS

## 2012-09-17 MED ORDER — CEPHALEXIN 500 MG PO CAPS: 500.0000 mg | ORAL_CAPSULE | Freq: Three times a day (TID) | ORAL | Status: DC

## 2012-09-17 NOTE — ED Notes (Signed)
Abscess culture: Mod. Haemophilus parainfluenzae.  Pt. treated with Bactrim DS.  Message sent to Dr. Lorenz Coaster.  He wrote back to stop Bactrim DS and start Keflex. He e-prescribed Rx. to pt.'s pharmacy. I called pt. and left message to call. Vassie Moselle 09/17/2012

## 2012-09-17 NOTE — Telephone Encounter (Signed)
Message copied by Reuben Likes on Mon Sep 17, 2012  7:36 PM ------      Message from: Vassie Moselle      Created: Mon Sep 17, 2012  5:59 PM      Regarding: lab       No sensitivity on this.  Pt. treated with Bactrim DS.  Is this OK?      Vassie Moselle      09/17/2012            ----- Message -----         From: Lab In [facility] Interface         Sent: 09/15/2012   7:16 AM           To: [facility] Follow Up                   ------

## 2012-09-17 NOTE — ED Notes (Signed)
The culture of the paronychia came back positive for Haemophilus parainfluenza. She was treated with Septra which may not work. There were no sensitivities on the report, it was beta lactamase negative, so I'm going to switch to cephalexin 500 mg 3 times a day for 10 days.  Reuben Likes, MD 09/17/12 [phone]

## 2012-09-18 ENCOUNTER — Telehealth (HOSPITAL_COMMUNITY): Payer: Self-pay | Admitting: *Deleted

## 2012-09-18 NOTE — ED Notes (Signed)
Pt. called back and verified x 2. Pt. given results and told that Dr. Lorenz Coaster wants to switch the antibioitic to Keflex and it was sent to the [facility] on [address].   Pt. wants it sent to the [facility]. I told her she can call the pharmacy and transfer it.  Pt. asked why it has to be switched. I explained that the Keflex will work better than the Bactrim for this bacteria. Pt. voiced understanding. Vassie Moselle 09/18/2012

## 2012-10-12 ENCOUNTER — Other Ambulatory Visit: Payer: 59

## 2012-10-18 ENCOUNTER — Telehealth: Payer: Self-pay | Admitting: Oncology

## 2012-10-18 NOTE — Telephone Encounter (Signed)
returned pt call to r/s appt....advised pt to let me know d/t when she needs to r/s.Marland KitchenMarland KitchenMarland Kitchen

## 2012-11-01 ENCOUNTER — Telehealth: Payer: Self-pay | Admitting: Oncology

## 2012-11-01 NOTE — Telephone Encounter (Signed)
pt called to r/s missed lab...done

## 2012-11-06 ENCOUNTER — Other Ambulatory Visit (HOSPITAL_BASED_OUTPATIENT_CLINIC_OR_DEPARTMENT_OTHER): Payer: 59

## 2012-11-06 DIAGNOSIS — D509 Iron deficiency anemia, unspecified: Secondary | ICD-10-CM

## 2012-11-06 LAB — CBC WITH DIFFERENTIAL/PLATELET
BASO%: 0.3 % (ref 0.0–2.0)
EOS%: 1.1 % (ref 0.0–7.0)
MCH: 25.8 pg (ref 25.1–34.0)
MCHC: 32.2 g/dL (ref 31.5–36.0)
RBC: 4.08 10*6/uL (ref 3.70–5.45)
RDW: 14.6 % — ABNORMAL HIGH (ref 11.2–14.5)
WBC: 8.1 10*3/uL (ref 3.9–10.3)
lymph#: 2.3 10*3/uL (ref 0.9–3.3)

## 2012-11-06 LAB — FERRITIN: Ferritin: 105 ng/mL (ref 10–291)

## 2012-11-06 LAB — IRON AND TIBC
Iron: 51 ug/dL (ref 42–145)
UIBC: 435 ug/dL — ABNORMAL HIGH (ref 125–400)

## 2012-11-07 ENCOUNTER — Telehealth: Payer: Self-pay | Admitting: Internal Medicine

## 2012-11-07 NOTE — Telephone Encounter (Signed)
Julia Estrada  Calls Due to change in insurance, request for prescriptions for Spironolactone 90 day supply with refills to [facility]:  FAX [phone], her insurance [employer]: # [phone].

## 2012-11-08 MED ORDER — SPIRONOLACTONE 100 MG PO TABS: 100.0000 mg | ORAL_TABLET | Freq: Every day | ORAL | Status: DC

## 2012-11-08 NOTE — Telephone Encounter (Signed)
Rx for Spironolactone  faxed to [facility].

## 2012-11-23 ENCOUNTER — Ambulatory Visit: Payer: 59 | Admitting: Internal Medicine

## 2012-12-04 ENCOUNTER — Ambulatory Visit: Payer: 59 | Admitting: Internal Medicine

## 2012-12-07 ENCOUNTER — Other Ambulatory Visit (INDEPENDENT_AMBULATORY_CARE_PROVIDER_SITE_OTHER): Payer: 59

## 2012-12-07 DIAGNOSIS — R0600 Dyspnea, unspecified: Secondary | ICD-10-CM

## 2012-12-07 DIAGNOSIS — E669 Obesity, unspecified: Secondary | ICD-10-CM

## 2012-12-07 DIAGNOSIS — R635 Abnormal weight gain: Secondary | ICD-10-CM

## 2012-12-07 DIAGNOSIS — R0989 Other specified symptoms and signs involving the circulatory and respiratory systems: Secondary | ICD-10-CM

## 2012-12-07 DIAGNOSIS — E119 Type 2 diabetes mellitus without complications: Secondary | ICD-10-CM

## 2012-12-07 DIAGNOSIS — E538 Deficiency of other specified B group vitamins: Secondary | ICD-10-CM

## 2012-12-07 LAB — BASIC METABOLIC PANEL
BUN: 11 mg/dL (ref 6–23)
Calcium: 9.3 mg/dL (ref 8.4–10.5)
Creatinine, Ser: 0.8 mg/dL (ref 0.4–1.2)
GFR: 105.9 mL/min (ref >60.00)
Glucose, Bld: 93 mg/dL (ref 70–99)
Potassium: 4.2 mEq/L (ref 3.5–5.1)

## 2012-12-11 ENCOUNTER — Encounter: Payer: Self-pay | Admitting: Internal Medicine

## 2012-12-11 ENCOUNTER — Ambulatory Visit (INDEPENDENT_AMBULATORY_CARE_PROVIDER_SITE_OTHER): Payer: 59 | Admitting: Internal Medicine

## 2012-12-11 VITALS — BP 130/100 | HR 80 | Temp 98.5°F | Resp 16 | Wt 284.0 lb

## 2012-12-11 DIAGNOSIS — D509 Iron deficiency anemia, unspecified: Secondary | ICD-10-CM

## 2012-12-11 DIAGNOSIS — R030 Elevated blood-pressure reading, without diagnosis of hypertension: Secondary | ICD-10-CM

## 2012-12-11 DIAGNOSIS — E119 Type 2 diabetes mellitus without complications: Secondary | ICD-10-CM

## 2012-12-11 DIAGNOSIS — Z0000 Encounter for general adult medical examination without abnormal findings: Secondary | ICD-10-CM

## 2012-12-11 DIAGNOSIS — G4733 Obstructive sleep apnea (adult) (pediatric): Secondary | ICD-10-CM

## 2012-12-11 DIAGNOSIS — E559 Vitamin D deficiency, unspecified: Secondary | ICD-10-CM

## 2012-12-11 DIAGNOSIS — E538 Deficiency of other specified B group vitamins: Secondary | ICD-10-CM

## 2012-12-11 MED ORDER — CYANOCOBALAMIN 1000 MCG SL SUBL: 0.5000 | SUBLINGUAL_TABLET | Freq: Every day | SUBLINGUAL | Status: DC

## 2012-12-11 MED ORDER — RANITIDINE HCL 300 MG PO TABS: 300.0000 mg | ORAL_TABLET | Freq: Every day | ORAL | Status: DC

## 2012-12-11 MED ORDER — SITAGLIPTIN PHOSPHATE 100 MG PO TABS: 100.0000 mg | ORAL_TABLET | Freq: Every day | ORAL | Status: DC

## 2012-12-11 NOTE — Assessment & Plan Note (Signed)
Discussed NAS diet

## 2012-12-11 NOTE — Assessment & Plan Note (Signed)
Take less of vit B12

## 2012-12-11 NOTE — Assessment & Plan Note (Signed)
Continue with current prescription therapy as reflected on the Med list.  

## 2012-12-11 NOTE — Progress Notes (Signed)
   Subjective:     Shortness of Breath The current episode started 1 to 4 weeks ago. The problem has been resolved. Pertinent negatives include no abdominal pain, headaches or rash. The symptoms are aggravated by exercise. The patient has no known risk factors for DVT/PE. She has tried nothing for the symptoms. Her past medical history is significant for asthma.  F/u on her breath cuts off when she is putting her chin down... She went to see ENT and was told it was GERD. Sx resolved The patient presents for a follow-up of  chronic hypertension, chronic dyslipidemia, type 2 diabetes controlled with medicines. Stress is better. F/u anemia    Review of Systems  Constitutional: Negative for chills, activity change, appetite change, fatigue and unexpected weight change.  HENT: Negative for congestion, mouth sores and sinus pressure.   Eyes: Negative for visual disturbance.  Respiratory: Positive for shortness of breath. Negative for cough and chest tightness.   Gastrointestinal: Negative for nausea and abdominal pain.  Genitourinary: Negative for frequency, difficulty urinating and vaginal pain.  Musculoskeletal: Negative for back pain and gait problem.  Skin: Negative for pallor and rash.  Neurological: Negative for dizziness, tremors, weakness, numbness and headaches.  Psychiatric/Behavioral: Negative for suicidal ideas, confusion and sleep disturbance. The patient is not nervous/anxious.    Wt Readings from Last 3 Encounters:  12/11/12 284 lb (128.822 kg)  07/23/12 290 lb (131.543 kg)  07/13/12 291 lb 12.8 oz (132.36 kg)   BP Readings from Last 3 Encounters:  12/11/12 130/100  09/14/12 150/77  07/23/12 120/80        Objective:   Physical Exam  Constitutional: She appears well-developed. No distress.  obese  HENT:  Head: Normocephalic.  Right Ear: External ear normal.  Left Ear: External ear normal.  Nose: Nose normal.  Mouth/Throat: Oropharynx is clear and moist.  Eyes:  Conjunctivae are normal. Pupils are equal, round, and reactive to light. Right eye exhibits no discharge. Left eye exhibits no discharge.  Neck: Normal range of motion. Neck supple. No JVD present. No tracheal deviation present. No thyromegaly present.  Cardiovascular: Normal rate, regular rhythm and normal heart sounds.   Pulmonary/Chest: No stridor. No respiratory distress. She has no wheezes.  Abdominal: Soft. Bowel sounds are normal. She exhibits no distension and no mass. There is no tenderness. There is no rebound and no guarding.  Musculoskeletal: She exhibits no edema and no tenderness.  Lymphadenopathy:    She has no cervical adenopathy.  Neurological: She displays normal reflexes. No cranial nerve deficit. She exhibits normal muscle tone. Coordination normal.  Skin: No rash noted. No erythema.  Psychiatric: She has a normal mood and affect. Her behavior is normal. Judgment and thought content normal.  No neck mass   Lab Results  Component Value Date   WBC 8.1 11/06/2012   HGB 10.5* 11/06/2012   HCT 32.7* 11/06/2012   PLT 335 11/06/2012   GLUCOSE 93 12/07/2012   CHOL 187 07/20/2012   TRIG 176.0* 07/20/2012   HDL 47.10 07/20/2012   LDLDIRECT 92.4 09/29/2008   LDLCALC 105* 07/20/2012   ALT 20 04/13/2012   AST 10 04/13/2012   NA 137 12/07/2012   K 4.2 12/07/2012   CL 104 12/07/2012   CREATININE 0.8 12/07/2012   BUN 11 12/07/2012   CO2 24 12/07/2012   TSH 1.67 03/26/2012   HGBA1C 7.2* 12/07/2012        Assessment & Plan:

## 2012-12-11 NOTE — Assessment & Plan Note (Signed)
On CPAP. ?

## 2012-12-11 NOTE — Assessment & Plan Note (Signed)
Better  Continue with current prescription therapy as reflected on the Med list. Cont w/wt loss  

## 2012-12-27 ENCOUNTER — Other Ambulatory Visit: Payer: Self-pay | Admitting: Obstetrics and Gynecology

## 2013-01-09 ENCOUNTER — Telehealth: Payer: Self-pay | Admitting: Oncology

## 2013-01-11 ENCOUNTER — Other Ambulatory Visit: Payer: 59

## 2013-01-11 ENCOUNTER — Ambulatory Visit: Payer: 59 | Admitting: Oncology

## 2013-02-06 ENCOUNTER — Other Ambulatory Visit: Payer: Self-pay | Admitting: Internal Medicine

## 2013-04-09 ENCOUNTER — Other Ambulatory Visit: Payer: Self-pay

## 2013-04-09 ENCOUNTER — Emergency Department (HOSPITAL_COMMUNITY)
Admission: EM | Admit: 2013-04-09 | Discharge: 2013-04-09 | Disposition: A | Discharge status: Home / Self Care | Payer: 59 | Attending: Emergency Medicine | Admitting: Emergency Medicine

## 2013-04-09 ENCOUNTER — Emergency Department (HOSPITAL_COMMUNITY): Payer: 59

## 2013-04-09 ENCOUNTER — Encounter (HOSPITAL_COMMUNITY): Payer: Self-pay | Admitting: *Deleted

## 2013-04-09 ENCOUNTER — Other Ambulatory Visit (INDEPENDENT_AMBULATORY_CARE_PROVIDER_SITE_OTHER): Payer: 59

## 2013-04-09 DIAGNOSIS — Z0000 Encounter for general adult medical examination without abnormal findings: Secondary | ICD-10-CM

## 2013-04-09 DIAGNOSIS — G4730 Sleep apnea, unspecified: Secondary | ICD-10-CM | POA: Insufficient documentation

## 2013-04-09 DIAGNOSIS — E559 Vitamin D deficiency, unspecified: Secondary | ICD-10-CM

## 2013-04-09 DIAGNOSIS — Z79899 Other long term (current) drug therapy: Secondary | ICD-10-CM | POA: Insufficient documentation

## 2013-04-09 DIAGNOSIS — D649 Anemia, unspecified: Secondary | ICD-10-CM | POA: Insufficient documentation

## 2013-04-09 DIAGNOSIS — E669 Obesity, unspecified: Secondary | ICD-10-CM | POA: Insufficient documentation

## 2013-04-09 DIAGNOSIS — R030 Elevated blood-pressure reading, without diagnosis of hypertension: Secondary | ICD-10-CM

## 2013-04-09 DIAGNOSIS — D509 Iron deficiency anemia, unspecified: Secondary | ICD-10-CM

## 2013-04-09 DIAGNOSIS — E119 Type 2 diabetes mellitus without complications: Secondary | ICD-10-CM

## 2013-04-09 DIAGNOSIS — R079 Chest pain, unspecified: Secondary | ICD-10-CM

## 2013-04-09 DIAGNOSIS — E538 Deficiency of other specified B group vitamins: Secondary | ICD-10-CM

## 2013-04-09 DIAGNOSIS — Z8659 Personal history of other mental and behavioral disorders: Secondary | ICD-10-CM | POA: Insufficient documentation

## 2013-04-09 DIAGNOSIS — J45909 Unspecified asthma, uncomplicated: Secondary | ICD-10-CM | POA: Insufficient documentation

## 2013-04-09 DIAGNOSIS — R112 Nausea with vomiting, unspecified: Secondary | ICD-10-CM | POA: Insufficient documentation

## 2013-04-09 DIAGNOSIS — K219 Gastro-esophageal reflux disease without esophagitis: Secondary | ICD-10-CM

## 2013-04-09 DIAGNOSIS — G4733 Obstructive sleep apnea (adult) (pediatric): Secondary | ICD-10-CM

## 2013-04-09 DIAGNOSIS — I10 Essential (primary) hypertension: Secondary | ICD-10-CM | POA: Insufficient documentation

## 2013-04-09 LAB — BASIC METABOLIC PANEL
BUN: 14 mg/dL (ref 6–23)
CO2: 27 mEq/L (ref 19–32)
GFR calc non Af Amer: >90 mL/min (ref >90)
GFR: 95.62 mL/min (ref >60.00)
Glucose, Bld: 105 mg/dL — ABNORMAL HIGH (ref 70–99)
Glucose, Bld: 111 mg/dL — ABNORMAL HIGH (ref 70–99)
Potassium: 4.2 mEq/L (ref 3.5–5.1)
Potassium: 4 mEq/L (ref 3.5–5.1)
Sodium: 135 mEq/L (ref 135–145)

## 2013-04-09 LAB — URINALYSIS
Nitrite: NEGATIVE
Specific Gravity, Urine: 1.015 (ref 1.000–1.030)
Total Protein, Urine: NEGATIVE
pH: 6 (ref 5.0–8.0)

## 2013-04-09 LAB — CBC WITH DIFFERENTIAL/PLATELET
Basophils Absolute: 0 10*3/uL (ref 0.0–0.1)
Eosinophils Relative: 1.4 % (ref 0.0–5.0)
HCT: 34.3 % — ABNORMAL LOW (ref 36.0–46.0)
Hemoglobin: 11.2 g/dL — ABNORMAL LOW (ref 12.0–15.0)
Lymphs Abs: 1.8 10*3/uL (ref 0.7–4.0)
Monocytes Relative: 7.4 % (ref 3.0–12.0)
Neutro Abs: 5 10*3/uL (ref 1.4–7.7)
RDW: 15.5 % — ABNORMAL HIGH (ref 11.5–14.6)

## 2013-04-09 LAB — CBC
Hemoglobin: 10.9 g/dL — ABNORMAL LOW (ref 12.0–15.0)
MCHC: 33.1 g/dL (ref 30.0–36.0)
RBC: 4.17 MIL/uL (ref 3.87–5.11)

## 2013-04-09 LAB — HEPATIC FUNCTION PANEL
Bilirubin, Direct: 0.1 mg/dL (ref 0.0–0.3)
Total Bilirubin: 0.5 mg/dL (ref 0.3–1.2)

## 2013-04-09 LAB — LIPID PANEL
LDL Cholesterol: 120 mg/dL — ABNORMAL HIGH (ref 0–99)
Total CHOL/HDL Ratio: 4
VLDL: 28.2 mg/dL (ref 0.0–40.0)

## 2013-04-09 LAB — HIV ANTIBODY (ROUTINE TESTING W REFLEX): HIV: NONREACTIVE

## 2013-04-09 LAB — IBC PANEL: Saturation Ratios: 7.7 % — ABNORMAL LOW (ref 20.0–50.0)

## 2013-04-09 LAB — POCT I-STAT TROPONIN I: Troponin i, poc: 0 ng/mL (ref 0.00–0.08)

## 2013-04-09 MED ORDER — GI COCKTAIL ~~LOC~~
30.0000 mL | Freq: Once | ORAL | Status: AC
  Administered 2013-04-09: 30 mL via ORAL
  Filled 2013-04-09: qty 30

## 2013-04-09 NOTE — ED Notes (Signed)
Pt reports intermittent chest pain for 1 week. Sharper chest pain since Saturday. Hx of acid reflux, had a bad "acid reflux attack last night". Feels pressure in her chest.

## 2013-04-09 NOTE — ED Provider Notes (Signed)
Medical screening examination/treatment/procedure(s) were performed by non-physician practitioner and as supervising physician I was immediately available for consultation/collaboration. Devoria Albe, MD, Armando Gang   Ward Givens, MD 04/09/13 1025

## 2013-04-09 NOTE — ED Notes (Signed)
Pt escorted to discharge window. Pt verbalized understanding discharge instructions. In no acute distress.  

## 2013-04-09 NOTE — ED Provider Notes (Signed)
CSN: [id number]     Arrival date & time 04/09/13  6578 History   First MD Initiated Contact with Patient 04/09/13 [phone]     Chief Complaint  Patient presents with  . Chest Pain   (Consider location/radiation/quality/duration/timing/severity/associated sxs/prior Treatment) HPI Comments: 42 year old female with a past medical history of obesity, asthma, diabetes, hypertension and GERD presents to the emergency department complaining of intermittent chest pain times one week. Patient states she has had midsternal chest pain described as "bloating" on and off for the past week rated 5/10, tried taking protonix and ranitidine with minimal relief. She does not recall eating anything out of the ordinary. Admits to associated nausea with an episode of vomiting this morning. 4 days ago she had an episode of sharp left-sided chest pain located underneath her left breast radiating up above her breast which subsided the next day. Last night she had "a bad acid reflux attack". This was unrelieved by TUMS. Denies fever, chills, shortness of breath, cough. Admits to a family history of early heart disease. Has seen GI in the past, had an endoscopy and was put on Protonix, ranitidine and Carafate, however she did not have any Carafate last night.  Patient is a 41 y.o. female presenting with chest pain. The history is provided by the patient.  Chest Pain Associated symptoms: nausea and vomiting   Associated symptoms: no back pain, no cough, no diaphoresis, no dizziness, no fever, no headache and no shortness of breath     Past Medical History  Diagnosis Date  . Anemia     iron deficiency  . Obesity     BMI=44  . Vitamin B12 deficiency   . Vitamin D deficiency   . Asthma   . Sleep apnea     on CPAP  . Depression 2011  . Diabetes mellitus 2010    type II  . GERD (gastroesophageal reflux disease) 2004  . Hypertension 2008   Past Surgical History  Procedure Laterality Date  . Fibroidectomy  2002  .  Tonsillectomy and adenoidectomy    . Cesarean section    . Myomectomy     Family History  Problem Relation Age of Onset  . Hypertension Mother     father, sisters  . Diabetes Mother     sister  . Kidney cancer Mother   . Cancer Mother   . Cholelithiasis Sister   . Clotting disorder Daughter    History  Substance Use Topics  . Smoking status: Never Smoker   . Smokeless tobacco: Never Used  . Alcohol Use: No   OB History   Grav Para Term Preterm Abortions TAB SAB Ect Mult Living                 Review of Systems  Constitutional: Negative for fever, chills, diaphoresis and activity change.  Respiratory: Negative for cough and shortness of breath.   Cardiovascular: Positive for chest pain.  Gastrointestinal: Positive for nausea and vomiting.  Musculoskeletal: Negative for back pain.  Neurological: Negative for dizziness, light-headedness and headaches.  All other systems reviewed and are negative.    Allergies  Codeine sulfate; Hydrochlorothiazide w-triamterene; Oxycodone-acetaminophen; and Victoza  Home Medications   Current Outpatient Rx  Name  Route  Sig  Dispense  Refill  . cholecalciferol (VITAMIN D) 1000 UNITS tablet   Oral   Take 1,000 Units by mouth daily.         . Cyanocobalamin (VITAMIN B-12) 1000 MCG SUBL   Sublingual   Place  1 tablet (1,000 mcg total) under the tongue daily.   100 tablet   3   . ferrous sulfate 325 (65 FE) MG tablet   Oral   Take 325 mg by mouth daily with breakfast.         . fluticasone (FLONASE) 50 MCG/ACT nasal spray   Nasal   Place 2 sprays into the nose daily.         Marland Kitchen ibuprofen (ADVIL,MOTRIN) 800 MG tablet   Oral   Take 800 mg by mouth as needed.           Marland Kitchen ketoconazole (NIZORAL) 2 % cream   Topical   Apply 1 application topically 2 (two) times daily.   120 g   3   . metFORMIN (GLUCOPHAGE-XR) 500 MG 24 hr tablet      TAKE 2 TABLETS BY MOUTH TWICE DAILY WITH FOOD   360 tablet   3   .  norethindrone-ethinyl estradiol (NECON) 0.5-35 MG-MCG per tablet   Oral   Take 1 tablet by mouth daily.           . pantoprazole (PROTONIX) 40 MG tablet      TAKE 1 TABLET BY MOUTH ONCE DAILY   90 tablet   PRN   . ranitidine (ZANTAC) 300 MG tablet   Oral   Take 1 tablet (300 mg total) by mouth at bedtime.   90 tablet   3   . sitaGLIPtin (JANUVIA) 100 MG tablet   Oral   Take 1 tablet (100 mg total) by mouth daily.   100 tablet   3   . spironolactone (ALDACTONE) 100 MG tablet   Oral   Take 1 tablet (100 mg total) by mouth daily.   90 tablet   2   . valACYclovir (VALTREX) 1000 MG tablet   Oral   Take 1,000 mg by mouth daily.            BP 126/74  Pulse 94  Temp(Src) 98.7 F (37.1 C) (Oral)  Resp 20  SpO2 97% Physical Exam  Nursing note and vitals reviewed. Constitutional: She is oriented to person, place, and time. She appears well-developed and well-nourished. No distress.  Obese  HENT:  Head: Normocephalic and atraumatic.  Mouth/Throat: Oropharynx is clear and moist.  Eyes: Conjunctivae and EOM are normal. Pupils are equal, round, and reactive to light.  Neck: Normal range of motion. Neck supple.  Cardiovascular: Normal rate, regular rhythm, normal heart sounds and normal pulses.   Pulmonary/Chest: Effort normal and breath sounds normal. No respiratory distress. She has no decreased breath sounds. She has no wheezes. She has no rhonchi. She has no rales. She exhibits no tenderness.  Abdominal: Soft. Bowel sounds are normal. There is no tenderness.  Musculoskeletal: Normal range of motion. She exhibits no edema.  Neurological: She is alert and oriented to person, place, and time. She has normal strength. No sensory deficit.  Skin: Skin is warm and dry. She is not diaphoretic.  Psychiatric: She has a normal mood and affect. Her behavior is normal.    ED Course  Procedures (including critical care time) Labs Review Labs Reviewed  CBC - Abnormal; Notable  for the following:    Hemoglobin 10.9 (*)    HCT 32.9 (*)    All other components within normal limits  BASIC METABOLIC PANEL - Abnormal; Notable for the following:    Glucose, Bld 111 (*)    All other components within normal limits  POCT I-STAT TROPONIN  I    Date: 04/09/2013  Rate: 75  Rhythm: sinus arrhythmia  QRS Axis: normal  Intervals: normal  ST/T Wave abnormalities: normal  Conduction Disutrbances:none  Narrative Interpretation: no stemi  Old EKG Reviewed: changes noted sinus arrhythmia, otherwise no sig changes   Imaging Review Dg Chest 2 View  04/09/2013   *RADIOLOGY REPORT*  Clinical Data: New onset mid/left-sided chest pain  CHEST - 2 VIEW  Comparison: 03/09/2012; 03/27/2012; 03/22/2011; chest CT - 03/23/2011  Findings:  Grossly unchanged enlarged cardiac silhouette and mediastinal contours.  Improved aeration of the lungs with persistent bibasilar opacities favored to represent atelectasis.  No new focal airspace opacities.  No pleural effusion or pneumothorax.  No evidence of edema.  Unchanged bones.  IMPRESSION: Cardiomegaly without acute cardiopulmonary disease.   Original Report Authenticated By: Tacey Ruiz, MD    MDM   1. Chest pain   2. GERD (gastroesophageal reflux disease)     Patient with chest pain x 1 week, no sob, has hx of GERD. She is well appearing in NAD with normal vital signs. No hx of CAD. Non-smoker. Low risk for ACS other than family hx. Symptoms improved from 5/10 to 2/10 with GI cocktail, no longer nauseated. Troponin negative, labs otherwise at patient's baseline. CXR showing cardiomegaly without edema. Lungs clear. I have low suspicion for ACS at this time, I feel her symptoms are related to GERD. She will be discharged, will follow up with her PCP and gastroenterologist. I advised her to watch her diet closely foods that exacerbate symptoms. Return precautions discussed. Patient states understanding of plan is agreeable.  Trevor Mace,  PA-C 04/09/13 1017

## 2013-04-15 ENCOUNTER — Encounter: Payer: Self-pay | Admitting: Internal Medicine

## 2013-04-15 ENCOUNTER — Ambulatory Visit (INDEPENDENT_AMBULATORY_CARE_PROVIDER_SITE_OTHER): Payer: 59 | Admitting: Internal Medicine

## 2013-04-15 VITALS — BP 138/80 | HR 60 | Temp 97.5°F | Resp 12 | Wt 288.0 lb

## 2013-04-15 DIAGNOSIS — E538 Deficiency of other specified B group vitamins: Secondary | ICD-10-CM

## 2013-04-15 DIAGNOSIS — R0600 Dyspnea, unspecified: Secondary | ICD-10-CM

## 2013-04-15 DIAGNOSIS — G4733 Obstructive sleep apnea (adult) (pediatric): Secondary | ICD-10-CM

## 2013-04-15 DIAGNOSIS — D509 Iron deficiency anemia, unspecified: Secondary | ICD-10-CM

## 2013-04-15 DIAGNOSIS — Z23 Encounter for immunization: Secondary | ICD-10-CM

## 2013-04-15 DIAGNOSIS — E119 Type 2 diabetes mellitus without complications: Secondary | ICD-10-CM

## 2013-04-15 DIAGNOSIS — E669 Obesity, unspecified: Secondary | ICD-10-CM

## 2013-04-15 DIAGNOSIS — R0609 Other forms of dyspnea: Secondary | ICD-10-CM

## 2013-04-15 MED ORDER — LORCASERIN HCL 10 MG PO TABS: 1.0000 | ORAL_TABLET | Freq: Two times a day (BID) | ORAL | Status: DC

## 2013-04-15 MED ORDER — RANITIDINE HCL 300 MG PO TABS: 300.0000 mg | ORAL_TABLET | Freq: Every day | ORAL | Status: DC

## 2013-04-15 NOTE — Assessment & Plan Note (Signed)
Continue with current prescription therapy as reflected on the Med list. Loose wt 

## 2013-04-15 NOTE — Assessment & Plan Note (Signed)
Options discussed Belviq to try

## 2013-04-15 NOTE — Assessment & Plan Note (Signed)
Continue with current prescription therapy as reflected on the Med list.  

## 2013-04-15 NOTE — Patient Instructions (Addendum)
[email or web address] 

## 2013-04-15 NOTE — Progress Notes (Signed)
   Subjective:     HPI  F/u on her breath cuts off when she is putting her chin down... She went to see ENT and was told it was GERD. Sx resolved then came back - went to ER lat Tue...  The patient presents for a follow-up of  chronic hypertension, chronic dyslipidemia, type 2 diabetes controlled with medicines. Stress is better. F/u anemia    Review of Systems  Constitutional: Negative for chills, activity change, appetite change, fatigue and unexpected weight change.  HENT: Negative for congestion, mouth sores and sinus pressure.   Eyes: Negative for visual disturbance.  Respiratory: Negative for cough and chest tightness.   Gastrointestinal: Negative for nausea.  Genitourinary: Negative for frequency, difficulty urinating and vaginal pain.  Musculoskeletal: Negative for back pain and gait problem.  Skin: Negative for pallor.  Neurological: Negative for dizziness, tremors, weakness and numbness.  Psychiatric/Behavioral: Negative for suicidal ideas, confusion and sleep disturbance. The patient is not nervous/anxious.    Wt Readings from Last 3 Encounters:  04/15/13 288 lb (130.636 kg)  12/11/12 284 lb (128.822 kg)  07/23/12 290 lb (131.543 kg)   BP Readings from Last 3 Encounters:  04/15/13 138/80  04/09/13 126/74  12/11/12 130/100        Objective:   Physical Exam  Constitutional: She appears well-developed. No distress.  obese  HENT:  Head: Normocephalic.  Right Ear: External ear normal.  Left Ear: External ear normal.  Nose: Nose normal.  Mouth/Throat: Oropharynx is clear and moist.  Eyes: Conjunctivae are normal. Pupils are equal, round, and reactive to light. Right eye exhibits no discharge. Left eye exhibits no discharge.  Neck: Normal range of motion. Neck supple. No JVD present. No tracheal deviation present. No thyromegaly present.  Cardiovascular: Normal rate, regular rhythm and normal heart sounds.   Pulmonary/Chest: No stridor. No respiratory distress.  She has no wheezes.  Abdominal: Soft. Bowel sounds are normal. She exhibits no distension and no mass. There is no tenderness. There is no rebound and no guarding.  Musculoskeletal: She exhibits no edema and no tenderness.  Lymphadenopathy:    She has no cervical adenopathy.  Neurological: She displays normal reflexes. No cranial nerve deficit. She exhibits normal muscle tone. Coordination normal.  Skin: No rash noted. No erythema.  Psychiatric: She has a normal mood and affect. Her behavior is normal. Judgment and thought content normal.  No neck mass   Lab Results  Component Value Date   WBC 7.0 04/09/2013   HGB 10.9* 04/09/2013   HCT 32.9* 04/09/2013   PLT 367 04/09/2013   GLUCOSE 111* 04/09/2013   CHOL 191 04/09/2013   TRIG 141.0 04/09/2013   HDL 42.70 04/09/2013   LDLDIRECT 92.4 09/29/2008   LDLCALC 120* 04/09/2013   ALT 27 04/09/2013   AST 15 04/09/2013   NA 135 04/09/2013   K 4.0 04/09/2013   CL 98 04/09/2013   CREATININE 0.73 04/09/2013   BUN 13 04/09/2013   CO2 27 04/09/2013   TSH 1.56 04/09/2013   HGBA1C 7.4* 04/09/2013        Assessment & Plan:

## 2013-04-16 NOTE — Assessment & Plan Note (Signed)
Morbid obesity is contributing Belviq trial

## 2013-04-16 NOTE — Assessment & Plan Note (Signed)
Cont CPAP

## 2013-04-16 NOTE — Assessment & Plan Note (Signed)
Labs reviewed.

## 2013-04-22 ENCOUNTER — Telehealth: Payer: Self-pay | Admitting: *Deleted

## 2013-04-22 ENCOUNTER — Telehealth: Payer: Self-pay | Admitting: Internal Medicine

## 2013-04-22 NOTE — Telephone Encounter (Signed)
Patient Information:  Caller Name: Thomasene  Phone: [phone]  Patient: Julia Estrada, Julia Estrada  Gender: Female  DOB: 12/15/1970  Age: 42 Years  PCP: Plotnikov, Alex (Adults only)  Pregnant: No  Office Follow Up:  Does the office need to follow up with this patient?: Yes  Instructions For The Office: Pt would like a medication called in for anxiety that started 04/18/13.   Symptoms  Reason For Call & Symptoms: Pt calling that a DR's appt 04/15/13 ,but the last few days she has needed something for anxiety.   Second Call for today.  Triaged Anxiety in [facility] and needs to be seen in 72 hrs for anxiety that is increasing in frequency and length.  Explained to pt that sometimes providers cannot call in meds until after office hrs.  If nothing can be called in then she will need an appt in 72 hrs.  Reviewed Health History In EMR: Yes  Reviewed Medications In EMR: Yes  Reviewed Allergies In EMR: Yes  Reviewed Surgeries / Procedures: Yes  Date of Onset of Symptoms: 04/18/2013 OB / GYN:  LMP: 04/08/2013  Guideline(s) Used:  No Protocol Available - Sick Adult  Disposition Per Guideline:   Discuss with PCP and Callback by Nurse Today  Reason For Disposition Reached:   Nursing judgment  Advice Given:  Call Back If:  New symptoms develop  You become worse.  Patient Will Follow Care Advice:  YES

## 2013-04-22 NOTE — Telephone Encounter (Signed)
Pt called requesting Anxiety Rx called in.  Please advise

## 2013-04-23 ENCOUNTER — Encounter: Payer: Self-pay | Admitting: Internal Medicine

## 2013-04-23 MED ORDER — CLONAZEPAM 0.25 MG PO TBDP: 0.2500 mg | ORAL_TABLET | Freq: Two times a day (BID) | ORAL | Status: DC | PRN

## 2013-04-23 NOTE — Telephone Encounter (Signed)
Ok Clonazepam Thx

## 2013-04-23 NOTE — Telephone Encounter (Signed)
Informed pt Rx sent to pharmacy.

## 2013-06-25 ENCOUNTER — Other Ambulatory Visit: Payer: Self-pay | Admitting: Internal Medicine

## 2013-07-03 ENCOUNTER — Encounter: Payer: Self-pay | Admitting: Internal Medicine

## 2013-07-03 ENCOUNTER — Ambulatory Visit (INDEPENDENT_AMBULATORY_CARE_PROVIDER_SITE_OTHER): Payer: 59 | Admitting: Internal Medicine

## 2013-07-03 VITALS — BP 124/88 | HR 91 | Temp 98.1°F | Wt 289.8 lb

## 2013-07-03 DIAGNOSIS — R05 Cough: Secondary | ICD-10-CM

## 2013-07-03 DIAGNOSIS — D509 Iron deficiency anemia, unspecified: Secondary | ICD-10-CM

## 2013-07-03 DIAGNOSIS — E559 Vitamin D deficiency, unspecified: Secondary | ICD-10-CM

## 2013-07-03 DIAGNOSIS — E119 Type 2 diabetes mellitus without complications: Secondary | ICD-10-CM

## 2013-07-03 MED ORDER — PREDNISONE 20 MG PO TABS: 20.0000 mg | ORAL_TABLET | Freq: Every day | ORAL | Status: DC

## 2013-07-03 MED ORDER — BENZONATATE 100 MG PO CAPS: 100.0000 mg | ORAL_CAPSULE | Freq: Three times a day (TID) | ORAL | Status: DC

## 2013-07-03 NOTE — Progress Notes (Signed)
Pre visit review using our clinic review tool, if applicable. No additional management support is needed unless otherwise documented below in the visit note. 

## 2013-07-03 NOTE — Patient Instructions (Signed)
Cough - no evidence of infection. This may be a viral illness or seasonal allergy with post-nasal drainage.  Plan Generic Claritin (loratadine) 10mg  once a day  OTC cough syrup with DM, for example Robitussin DM  Bensonatate perles 100 mg three times a day  Prednisone 20 mg once a day for 7 days  Cough, Adult  A cough is a reflex that helps clear your throat and airways. It can help heal the body or may be a reaction to an irritated airway. A cough may only last 2 or 3 weeks (acute) or may last more than 8 weeks (chronic).  CAUSES Acute cough:  Viral or bacterial infections. Chronic cough:  Infections.  Allergies.  Asthma.  Post-nasal drip.  Smoking.  Heartburn or acid reflux.  Some medicines.  Chronic lung problems (COPD).  Cancer. SYMPTOMS   Cough.  Fever.  Chest pain.  Increased breathing rate.  High-pitched whistling sound when breathing (wheezing).  Colored mucus that you cough up (sputum). TREATMENT   A bacterial cough may be treated with antibiotic medicine.  A viral cough must run its course and will not respond to antibiotics.  Your caregiver may recommend other treatments if you have a chronic cough. HOME CARE INSTRUCTIONS   Only take over-the-counter or prescription medicines for pain, discomfort, or fever as directed by your caregiver. Use cough suppressants only as directed by your caregiver.  Use a cold steam vaporizer or humidifier in your bedroom or home to help loosen secretions.  Sleep in a semi-upright position if your cough is worse at night.  Rest as needed.  Stop smoking if you smoke. SEEK IMMEDIATE MEDICAL CARE IF:   You have pus in your sputum.  Your cough starts to worsen.  You cannot control your cough with suppressants and are losing sleep.  You begin coughing up blood.  You have difficulty breathing.  You develop pain which is getting worse or is uncontrolled with medicine.  You have a fever. MAKE SURE YOU:    Understand these instructions.  Will watch your condition.  Will get help right away if you are not doing well or get worse. Document Released: 01/14/2011 Document Revised: 10/10/2011 Document Reviewed: 01/14/2011 [facility] Patient Information 2014 [place], [place].

## 2013-07-03 NOTE — Progress Notes (Signed)
Subjective:    Patient ID: Julia Estrada, female    DOB: Dec 20, 1970, 41 y.o.   MRN: [id number]  HPI Julia Estrada presents for a 10 day h/o cough w/o cold symptoms: no fever, no sinus, no productive cough, no SOB. She has been taking mucinex. Does not feel like a tickle cough. She will have paroxysms of continuous coughing but denies near-syncope or syncope. No culprit medications.   She is taking her diabetes medications and is due for an A1C. She never started  Belviq. She is still taking iron tablets.   PMH, FamHx and SocHx reviewed for any changes and relevance.  Current Outpatient Prescriptions on File Prior to Visit  Medication Sig Dispense Refill  . cholecalciferol (VITAMIN D) 1000 UNITS tablet Take 1,000 Units by mouth daily.      . clonazePAM (KLONOPIN) 0.25 MG disintegrating tablet Take 1 tablet (0.25 mg total) by mouth 2 (two) times daily as needed.  30 tablet  1  . Cyanocobalamin (VITAMIN B-12) 1000 MCG SUBL Place 1 tablet (1,000 mcg total) under the tongue daily.  100 tablet  3  . ferrous sulfate 325 (65 FE) MG tablet Take 325 mg by mouth daily with breakfast.      . fluticasone (FLONASE) 50 MCG/ACT nasal spray Place 2 sprays into the nose daily.      Marland Kitchen ibuprofen (ADVIL,MOTRIN) 800 MG tablet Take 800 mg by mouth as needed.        Marland Kitchen ketoconazole (NIZORAL) 2 % cream Apply 1 application topically 2 (two) times daily.  120 g  3  . metFORMIN (GLUCOPHAGE-XR) 500 MG 24 hr tablet TAKE 2 TABLETS BY MOUTH TWICE DAILY WITH FOOD  360 tablet  3  . norethindrone-ethinyl estradiol (NECON) 0.5-35 MG-MCG per tablet Take 1 tablet by mouth daily.        . pantoprazole (PROTONIX) 40 MG tablet TAKE 1 TABLET BY MOUTH ONCE DAILY  90 tablet  PRN  . ranitidine (ZANTAC) 300 MG tablet Take 1 tablet (300 mg total) by mouth at bedtime.  90 tablet  3  . sitaGLIPtin (JANUVIA) 100 MG tablet Take 1 tablet (100 mg total) by mouth daily.  100 tablet  3  . spironolactone (ALDACTONE) 100 MG tablet Take 1 tablet by mouth   daily  90 tablet  3  . valACYclovir (VALTREX) 1000 MG tablet Take 1,000 mg by mouth daily.         No current facility-administered medications on file prior to visit.      Review of Systems System review is negative for any constitutional, cardiac, pulmonary, GI or neuro symptoms or complaints other than as described in the HPI.     Objective:   Physical Exam Filed Vitals:   07/03/13 1552  BP: 124/88  Pulse: 91  Temp: 98.1 F (36.7 C)   Wt Readings from Last 3 Encounters:  07/03/13 289 lb 12.8 oz (131.452 kg)  04/15/13 288 lb (130.636 kg)  12/11/12 284 lb (128.822 kg)   BP Readings from Last 3 Encounters:  07/03/13 124/88  04/15/13 138/80  04/09/13 126/74   gen'l- overweight woman  HEENT- no sinus tenderness, throat clear Neck - supple Node - negative Pulm - normal respirations, CTAP Cor - RRR       Assessment & Plan:  Cough - no evidence of infection. This may be a viral illness or seasonal allergy with post-nasal drainage.  Plan Generic Claritin (loratadine) 10mg  once a day  OTC cough syrup with DM, for example  Robitussin DM  Bensonatate perles 100 mg three times a day  Prednisone 20 mg once a day for 7 days

## 2013-07-15 ENCOUNTER — Ambulatory Visit (INDEPENDENT_AMBULATORY_CARE_PROVIDER_SITE_OTHER): Payer: 59 | Admitting: Internal Medicine

## 2013-07-15 ENCOUNTER — Ambulatory Visit (INDEPENDENT_AMBULATORY_CARE_PROVIDER_SITE_OTHER)
Admission: RE | Admit: 2013-07-15 | Discharge: 2013-07-15 | Disposition: A | Discharge status: Home / Self Care | Payer: 59 | Source: Ambulatory Visit | Attending: Internal Medicine | Admitting: Internal Medicine

## 2013-07-15 ENCOUNTER — Other Ambulatory Visit (INDEPENDENT_AMBULATORY_CARE_PROVIDER_SITE_OTHER): Payer: 59

## 2013-07-15 ENCOUNTER — Encounter: Payer: Self-pay | Admitting: Internal Medicine

## 2013-07-15 VITALS — BP 114/84 | HR 76 | Temp 98.8°F | Resp 16 | Ht 66.0 in | Wt 295.0 lb

## 2013-07-15 DIAGNOSIS — R05 Cough: Secondary | ICD-10-CM | POA: Insufficient documentation

## 2013-07-15 DIAGNOSIS — R079 Chest pain, unspecified: Secondary | ICD-10-CM

## 2013-07-15 DIAGNOSIS — J209 Acute bronchitis, unspecified: Secondary | ICD-10-CM

## 2013-07-15 DIAGNOSIS — R059 Cough, unspecified: Secondary | ICD-10-CM

## 2013-07-15 DIAGNOSIS — E538 Deficiency of other specified B group vitamins: Secondary | ICD-10-CM

## 2013-07-15 LAB — CBC WITH DIFFERENTIAL/PLATELET
Basophils Absolute: 0.1 10*3/uL (ref 0.0–0.1)
Eosinophils Absolute: 0.2 10*3/uL (ref 0.0–0.7)
Eosinophils Relative: 2.2 % (ref 0.0–5.0)
HCT: 32.6 % — ABNORMAL LOW (ref 36.0–46.0)
Lymphs Abs: 2.9 10*3/uL (ref 0.7–4.0)
MCHC: 32.4 g/dL (ref 30.0–36.0)
MCV: 78.3 fl (ref 78.0–100.0)
Monocytes Absolute: 0.7 10*3/uL (ref 0.1–1.0)
Platelets: 388 10*3/uL (ref 150.0–400.0)
RDW: 15.7 % — ABNORMAL HIGH (ref 11.5–14.6)

## 2013-07-15 MED ORDER — AZITHROMYCIN 500 MG PO TABS: 500.0000 mg | ORAL_TABLET | Freq: Every day | ORAL | Status: DC

## 2013-07-15 MED ORDER — PROMETHAZINE-DM 6.25-15 MG/5ML PO SYRP: 5.0000 mL | ORAL_SOLUTION | Freq: Four times a day (QID) | ORAL | Status: DC | PRN

## 2013-07-15 NOTE — Patient Instructions (Signed)
Acute Bronchitis Bronchitis is inflammation of the airways that extend from the windpipe into the lungs (bronchi). The inflammation often causes mucus to develop. This leads to a cough, which is the most common symptom of bronchitis.  In acute bronchitis, the condition usually develops suddenly and goes away over time, usually in a couple weeks. Smoking, allergies, and asthma can make bronchitis worse. Repeated episodes of bronchitis may cause further lung problems.  CAUSES Acute bronchitis is most often caused by the same virus that causes a cold. The virus can spread from person to person (contagious).  SIGNS AND SYMPTOMS   Cough.   Fever.   Coughing up mucus.   Body aches.   Chest congestion.   Chills.   Shortness of breath.   Sore throat.  DIAGNOSIS  Acute bronchitis is usually diagnosed through a physical exam. Tests, such as chest X-rays, are sometimes done to rule out other conditions.  TREATMENT  Acute bronchitis usually goes away in a couple weeks. Often times, no medical treatment is necessary. Medicines are sometimes given for relief of fever or cough. Antibiotics are usually not needed but may be prescribed in certain situations. In some cases, an inhaler may be recommended to help reduce shortness of breath and control the cough. A cool mist vaporizer may also be used to help thin bronchial secretions and make it easier to clear the chest.  HOME CARE INSTRUCTIONS  Get plenty of rest.   Drink enough fluids to keep your urine clear or pale yellow (unless you have a medical condition that requires fluid restriction). Increasing fluids may help thin your secretions and will prevent dehydration.   Only take over-the-counter or prescription medicines as directed by your health care provider.   Avoid smoking and secondhand smoke. Exposure to cigarette smoke or irritating chemicals will make bronchitis worse. If you are a smoker, consider using nicotine gum or skin  patches to help control withdrawal symptoms. Quitting smoking will help your lungs heal faster.   Reduce the chances of another bout of acute bronchitis by washing your hands frequently, avoiding people with cold symptoms, and trying not to touch your hands to your mouth, nose, or eyes.   Follow up with your health care provider as directed.  SEEK MEDICAL CARE IF: Your symptoms do not improve after 1 week of treatment.  SEEK IMMEDIATE MEDICAL CARE IF:  You develop an increased fever or chills.   You have chest pain.   You have severe shortness of breath.  You have bloody sputum.   You develop dehydration.  You develop fainting.  You develop repeated vomiting.  You develop a severe headache. MAKE SURE YOU:   Understand these instructions.  Will watch your condition.  Will get help right away if you are not doing well or get worse. Document Released: 08/25/2004 Document Revised: 03/20/2013 Document Reviewed: 01/08/2013 [employer] Patient Information 2014 [employer].  

## 2013-07-15 NOTE — Progress Notes (Signed)
Pre visit review using our clinic review tool, if applicable. No additional management support is needed unless otherwise documented below in the visit note. 

## 2013-07-15 NOTE — Progress Notes (Signed)
Subjective:    Patient ID: Julia Estrada, female    DOB: 1971-07-18, 42 y.o.   MRN: [id number]  Cough This is a new problem. The current episode started in the past 7 days. The problem has been unchanged. The problem occurs every few hours. The cough is productive of purulent sputum. Associated symptoms include chest pain (sharp pain above her left breast) and ear pain. Pertinent negatives include no chills, ear congestion, fever, headaches, heartburn, hemoptysis, myalgias, nasal congestion, postnasal drip, rash, rhinorrhea, sore throat, shortness of breath, sweats, weight loss or wheezing. She has tried OTC cough suppressant for the symptoms. The treatment provided no relief.      Review of Systems  Constitutional: Negative for fever, chills, weight loss, diaphoresis, activity change, appetite change, fatigue and unexpected weight change.  HENT: Positive for ear pain. Negative for postnasal drip, rhinorrhea and sore throat.   Eyes: Negative.   Respiratory: Positive for cough. Negative for apnea, hemoptysis, choking, chest tightness, shortness of breath, wheezing and stridor.   Cardiovascular: Positive for chest pain (sharp pain above her left breast).  Gastrointestinal: Negative.  Negative for heartburn, nausea, vomiting, abdominal pain, diarrhea, constipation and blood in stool.  Endocrine: Negative.   Genitourinary: Negative.   Musculoskeletal: Negative.  Negative for arthralgias, back pain, gait problem, joint swelling, myalgias, neck pain and neck stiffness.  Skin: Negative.  Negative for rash.  Allergic/Immunologic: Negative.   Neurological: Negative.  Negative for headaches.  Hematological: Negative.  Negative for adenopathy. Does not bruise/bleed easily.  Psychiatric/Behavioral: Negative.        Objective:   Physical Exam  Vitals reviewed. Constitutional: She is oriented to person, place, and time. She appears well-developed and well-nourished.  Non-toxic appearance. She does  not have a sickly appearance. She does not appear ill. No distress.  HENT:  Head: Normocephalic and atraumatic.  Mouth/Throat: Oropharynx is clear and moist. No oropharyngeal exudate.  Eyes: Conjunctivae are normal. Right eye exhibits no discharge. Left eye exhibits no discharge. No scleral icterus.  Neck: Normal range of motion. Neck supple. No JVD present. No tracheal deviation present. No thyromegaly present.  Cardiovascular: Normal rate, regular rhythm, S1 normal, S2 normal, normal heart sounds and intact distal pulses.  Exam reveals no gallop and no friction rub.   No murmur heard. Pulses:      Carotid pulses are 1+ on the right side, and 1+ on the left side.      Radial pulses are 1+ on the right side, and 1+ on the left side.       Femoral pulses are 1+ on the right side, and 1+ on the left side.      Popliteal pulses are 1+ on the right side, and 1+ on the left side.       Dorsalis pedis pulses are 1+ on the right side, and 1+ on the left side.       Posterior tibial pulses are 1+ on the right side, and 1+ on the left side.  Pulmonary/Chest: Effort normal and breath sounds normal. No stridor. No respiratory distress. She has no wheezes. She has no rales. She exhibits no tenderness.  Abdominal: Soft. Bowel sounds are normal. She exhibits no distension and no mass. There is no tenderness. There is no rebound and no guarding.  Musculoskeletal: Normal range of motion. She exhibits no edema and no tenderness.  Lymphadenopathy:    She has no cervical adenopathy.  Neurological: She is oriented to person, place, and time.  Skin:  Skin is warm and dry. No rash noted. She is not diaphoretic. No erythema. No pallor.  Psychiatric: She has a normal mood and affect. Her behavior is normal. Judgment and thought content normal.     Lab Results  Component Value Date   WBC 7.0 04/09/2013   HGB 10.9* 04/09/2013   HCT 32.9* 04/09/2013   PLT 367 04/09/2013   GLUCOSE 111* 04/09/2013   CHOL 191 04/09/2013    TRIG 141.0 04/09/2013   HDL 42.70 04/09/2013   LDLDIRECT 92.4 09/29/2008   LDLCALC 120* 04/09/2013   ALT 27 04/09/2013   AST 15 04/09/2013   NA 135 04/09/2013   K 4.0 04/09/2013   CL 98 04/09/2013   CREATININE 0.73 04/09/2013   BUN 13 04/09/2013   CO2 27 04/09/2013   TSH 1.56 04/09/2013   HGBA1C 7.4* 04/09/2013       Assessment & Plan:

## 2013-07-16 ENCOUNTER — Encounter: Payer: Self-pay | Admitting: Internal Medicine

## 2013-07-16 LAB — D-DIMER, QUANTITATIVE: D-Dimer, Quant: 0.61 ug/mL-FEU — ABNORMAL HIGH (ref 0.00–0.48)

## 2013-07-16 NOTE — Assessment & Plan Note (Signed)
I will treat the infection with a Zpak and will control the cough with phenergan-dm 

## 2013-07-16 NOTE — Assessment & Plan Note (Signed)
Her exam is normal, I don' think the pain is cardiac I will check her d-dimer to see if there is concern for a PE and will check her CXR to see if there is any lung pathology

## 2013-07-16 NOTE — Assessment & Plan Note (Signed)
I will check her Chest Xray to see if there is pna, mass, edema, or pneumothorax

## 2013-07-23 ENCOUNTER — Ambulatory Visit: Payer: 59 | Admitting: Internal Medicine

## 2013-08-06 ENCOUNTER — Other Ambulatory Visit (INDEPENDENT_AMBULATORY_CARE_PROVIDER_SITE_OTHER): Payer: 59

## 2013-08-06 DIAGNOSIS — D509 Iron deficiency anemia, unspecified: Secondary | ICD-10-CM

## 2013-08-06 DIAGNOSIS — E559 Vitamin D deficiency, unspecified: Secondary | ICD-10-CM

## 2013-08-06 DIAGNOSIS — E119 Type 2 diabetes mellitus without complications: Secondary | ICD-10-CM

## 2013-08-06 LAB — HEMOGLOBIN AND HEMATOCRIT, BLOOD
HCT: 32.6 % — ABNORMAL LOW (ref 36.0–46.0)
HEMOGLOBIN: 10.6 g/dL — AB (ref 12.0–15.0)

## 2013-08-06 LAB — HEMOGLOBIN A1C: HEMOGLOBIN A1C: 8.2 % — AB (ref 4.6–6.5)

## 2013-08-06 LAB — COMPREHENSIVE METABOLIC PANEL
ALBUMIN: 3.8 g/dL (ref 3.5–5.2)
ALK PHOS: 79 U/L (ref 39–117)
ALT: 37 U/L — ABNORMAL HIGH (ref 0–35)
AST: 17 U/L (ref 0–37)
BILIRUBIN TOTAL: 0.5 mg/dL (ref 0.3–1.2)
BUN: 13 mg/dL (ref 6–23)
CO2: 26 meq/L (ref 19–32)
Calcium: 9.5 mg/dL (ref 8.4–10.5)
Chloride: 101 mEq/L (ref 96–112)
Creatinine, Ser: 0.8 mg/dL (ref 0.4–1.2)
GFR: 102.48 mL/min (ref >60.00)
GLUCOSE: 99 mg/dL (ref 70–99)
POTASSIUM: 3.8 meq/L (ref 3.5–5.1)
Sodium: 136 mEq/L (ref 135–145)
Total Protein: 7.7 g/dL (ref 6.0–8.3)

## 2013-08-08 LAB — VITAMIN D 1,25 DIHYDROXY
VITAMIN D3 1, 25 (OH): 85 pg/mL
Vitamin D 1, 25 (OH)2 Total: 85 pg/mL — ABNORMAL HIGH (ref 18–72)

## 2013-08-13 ENCOUNTER — Encounter: Payer: Self-pay | Admitting: Internal Medicine

## 2013-08-13 ENCOUNTER — Ambulatory Visit (INDEPENDENT_AMBULATORY_CARE_PROVIDER_SITE_OTHER): Payer: 59 | Admitting: Internal Medicine

## 2013-08-13 VITALS — BP 140/92 | HR 76 | Temp 97.8°F | Resp 16 | Wt 297.0 lb

## 2013-08-13 DIAGNOSIS — D509 Iron deficiency anemia, unspecified: Secondary | ICD-10-CM

## 2013-08-13 DIAGNOSIS — E538 Deficiency of other specified B group vitamins: Secondary | ICD-10-CM

## 2013-08-13 DIAGNOSIS — E119 Type 2 diabetes mellitus without complications: Secondary | ICD-10-CM

## 2013-08-13 DIAGNOSIS — E669 Obesity, unspecified: Secondary | ICD-10-CM

## 2013-08-13 DIAGNOSIS — Z23 Encounter for immunization: Secondary | ICD-10-CM

## 2013-08-13 DIAGNOSIS — E559 Vitamin D deficiency, unspecified: Secondary | ICD-10-CM

## 2013-08-13 MED ORDER — SITAGLIP PHOS-METFORMIN HCL ER 100-1000 MG PO TB24: ORAL_TABLET | ORAL | Status: DC

## 2013-08-13 MED ORDER — VITAMIN D 1000 UNITS PO TABS: 1000.0000 [IU] | ORAL_TABLET | ORAL | Status: DC

## 2013-08-13 NOTE — Progress Notes (Signed)
Pre visit review using our clinic review tool, if applicable. No additional management support is needed unless otherwise documented below in the visit note. 

## 2013-08-13 NOTE — Progress Notes (Signed)
   Subjective:    HPI Pt resents for a follow-up of  chronic hypertension, chronic dyslipidemia, type 2 diabetes controlled with medicines. Stress is better. F/u anemia    Review of Systems  Constitutional: Negative for chills, activity change, appetite change, fatigue and unexpected weight change.  HENT: Negative for congestion, mouth sores and sinus pressure.   Eyes: Negative for visual disturbance.  Respiratory: Negative for cough and chest tightness.   Gastrointestinal: Negative for nausea.  Genitourinary: Negative for frequency, difficulty urinating and vaginal pain.  Musculoskeletal: Negative for back pain and gait problem.  Skin: Negative for pallor.  Neurological: Negative for dizziness, tremors, weakness and numbness.  Psychiatric/Behavioral: Negative for suicidal ideas, confusion and sleep disturbance. The patient is not nervous/anxious.    Wt Readings from Last 3 Encounters:  08/13/13 297 lb (134.718 kg)  07/15/13 295 lb (133.811 kg)  07/03/13 289 lb 12.8 oz (131.452 kg)   BP Readings from Last 3 Encounters:  08/13/13 140/92  07/15/13 114/84  07/03/13 124/88        Objective:   Physical Exam  Constitutional: She appears well-developed. No distress.  obese  HENT:  Head: Normocephalic.  Right Ear: External ear normal.  Left Ear: External ear normal.  Nose: Nose normal.  Mouth/Throat: Oropharynx is clear and moist.  Eyes: Conjunctivae are normal. Pupils are equal, round, and reactive to light. Right eye exhibits no discharge. Left eye exhibits no discharge.  Neck: Normal range of motion. Neck supple. No JVD present. No tracheal deviation present. No thyromegaly present.  Cardiovascular: Normal rate, regular rhythm and normal heart sounds.   Pulmonary/Chest: No stridor. No respiratory distress. She has no wheezes.  Abdominal: Soft. Bowel sounds are normal. She exhibits no distension and no mass. There is no tenderness. There is no rebound and no guarding.   Musculoskeletal: She exhibits no edema and no tenderness.  Lymphadenopathy:    She has no cervical adenopathy.  Neurological: She displays normal reflexes. No cranial nerve deficit. She exhibits normal muscle tone. Coordination normal.  Skin: No rash noted. No erythema.  Psychiatric: She has a normal mood and affect. Her behavior is normal. Judgment and thought content normal.  No neck mass   Lab Results  Component Value Date   WBC 9.0 07/15/2013   HGB 10.6* 08/06/2013   HCT 32.6* 08/06/2013   PLT 388.0 07/15/2013   GLUCOSE 99 08/06/2013   CHOL 191 04/09/2013   TRIG 141.0 04/09/2013   HDL 42.70 04/09/2013   LDLDIRECT 92.4 09/29/2008   LDLCALC 120* 04/09/2013   ALT 37* 08/06/2013   AST 17 08/06/2013   NA 136 08/06/2013   K 3.8 08/06/2013   CL 101 08/06/2013   CREATININE 0.8 08/06/2013   BUN 13 08/06/2013   CO2 26 08/06/2013   TSH 1.56 04/09/2013   HGBA1C 8.2* 08/06/2013        Assessment & Plan:

## 2013-08-13 NOTE — Assessment & Plan Note (Signed)
Reduce Vit D to 3/wk

## 2013-08-13 NOTE — Assessment & Plan Note (Signed)
Wt Readings from Last 3 Encounters:  08/13/13 297 lb (134.718 kg)  07/15/13 295 lb (133.811 kg)  07/03/13 289 lb 12.8 oz (131.452 kg)  discussed

## 2013-08-13 NOTE — Assessment & Plan Note (Addendum)
Chronic 1/15 wore due to wt gain and steroids for sciatica She re-started Januvia Change to Janumet XR [phone] 1 qd

## 2013-08-13 NOTE — Assessment & Plan Note (Signed)
Continue with current prescription therapy as reflected on the Med list.  

## 2013-08-14 ENCOUNTER — Telehealth: Payer: Self-pay

## 2013-08-14 NOTE — Telephone Encounter (Signed)
Relevant patient education assigned to patient using Emmi. ° °

## 2013-09-23 ENCOUNTER — Other Ambulatory Visit: Payer: Self-pay | Admitting: *Deleted

## 2013-09-23 MED ORDER — PANTOPRAZOLE SODIUM 40 MG PO TBEC: 40.0000 mg | DELAYED_RELEASE_TABLET | Freq: Every day | ORAL | Status: DC

## 2013-09-24 ENCOUNTER — Encounter: Payer: Self-pay | Admitting: Internal Medicine

## 2013-10-17 ENCOUNTER — Ambulatory Visit: Payer: 59 | Attending: Orthopedic Surgery

## 2013-10-17 DIAGNOSIS — IMO0001 Reserved for inherently not codable concepts without codable children: Secondary | ICD-10-CM | POA: Insufficient documentation

## 2013-10-17 DIAGNOSIS — M25619 Stiffness of unspecified shoulder, not elsewhere classified: Secondary | ICD-10-CM | POA: Insufficient documentation

## 2013-10-17 DIAGNOSIS — M25519 Pain in unspecified shoulder: Secondary | ICD-10-CM | POA: Insufficient documentation

## 2013-10-21 ENCOUNTER — Ambulatory Visit: Payer: 59

## 2013-10-24 ENCOUNTER — Ambulatory Visit: Payer: 59

## 2013-10-28 ENCOUNTER — Ambulatory Visit: Payer: 59

## 2013-10-31 ENCOUNTER — Ambulatory Visit: Payer: 59 | Attending: Orthopedic Surgery | Admitting: Physical Therapy

## 2013-10-31 DIAGNOSIS — M25619 Stiffness of unspecified shoulder, not elsewhere classified: Secondary | ICD-10-CM | POA: Insufficient documentation

## 2013-10-31 DIAGNOSIS — M25519 Pain in unspecified shoulder: Secondary | ICD-10-CM | POA: Insufficient documentation

## 2013-10-31 DIAGNOSIS — IMO0001 Reserved for inherently not codable concepts without codable children: Secondary | ICD-10-CM | POA: Insufficient documentation

## 2013-11-04 ENCOUNTER — Ambulatory Visit: Payer: 59

## 2013-11-29 ENCOUNTER — Other Ambulatory Visit: Payer: Self-pay | Admitting: *Deleted

## 2013-11-29 MED ORDER — SITAGLIP PHOS-METFORMIN HCL ER 100-1000 MG PO TB24: 1.0000 | ORAL_TABLET | Freq: Every day | ORAL | Status: DC

## 2013-12-10 ENCOUNTER — Ambulatory Visit: Payer: 59 | Admitting: Internal Medicine

## 2013-12-12 ENCOUNTER — Other Ambulatory Visit: Payer: Self-pay | Admitting: Orthopedic Surgery

## 2013-12-12 DIAGNOSIS — M25512 Pain in left shoulder: Secondary | ICD-10-CM

## 2013-12-26 ENCOUNTER — Other Ambulatory Visit: Payer: 59

## 2014-01-05 ENCOUNTER — Ambulatory Visit
Admission: RE | Admit: 2014-01-05 | Discharge: 2014-01-05 | Disposition: A | Discharge status: Home / Self Care | Payer: 59 | Source: Ambulatory Visit | Attending: Orthopedic Surgery | Admitting: Orthopedic Surgery

## 2014-01-05 DIAGNOSIS — M25512 Pain in left shoulder: Secondary | ICD-10-CM

## 2014-01-07 ENCOUNTER — Ambulatory Visit: Payer: 59 | Admitting: Internal Medicine

## 2014-02-05 ENCOUNTER — Other Ambulatory Visit (INDEPENDENT_AMBULATORY_CARE_PROVIDER_SITE_OTHER): Payer: 59

## 2014-02-05 DIAGNOSIS — E559 Vitamin D deficiency, unspecified: Secondary | ICD-10-CM

## 2014-02-05 DIAGNOSIS — E538 Deficiency of other specified B group vitamins: Secondary | ICD-10-CM

## 2014-02-05 DIAGNOSIS — D509 Iron deficiency anemia, unspecified: Secondary | ICD-10-CM

## 2014-02-05 DIAGNOSIS — E119 Type 2 diabetes mellitus without complications: Secondary | ICD-10-CM

## 2014-02-05 DIAGNOSIS — E669 Obesity, unspecified: Secondary | ICD-10-CM

## 2014-02-05 LAB — BASIC METABOLIC PANEL
BUN: 9 mg/dL (ref 6–23)
CO2: 29 meq/L (ref 19–32)
Calcium: 9.2 mg/dL (ref 8.4–10.5)
Chloride: 100 mEq/L (ref 96–112)
Creatinine, Ser: 0.6 mg/dL (ref 0.4–1.2)
GFR: 132.75 mL/min (ref >60.00)
GLUCOSE: 147 mg/dL — AB (ref 70–99)
POTASSIUM: 3.9 meq/L (ref 3.5–5.1)
SODIUM: 135 meq/L (ref 135–145)

## 2014-02-05 LAB — HEMOGLOBIN A1C: Hgb A1c MFr Bld: 8.2 % — ABNORMAL HIGH (ref 4.6–6.5)

## 2014-02-06 ENCOUNTER — Other Ambulatory Visit: Payer: Self-pay | Admitting: Internal Medicine

## 2014-02-07 ENCOUNTER — Ambulatory Visit (INDEPENDENT_AMBULATORY_CARE_PROVIDER_SITE_OTHER): Payer: 59 | Admitting: Internal Medicine

## 2014-02-07 ENCOUNTER — Encounter: Payer: Self-pay | Admitting: Internal Medicine

## 2014-02-07 VITALS — BP 122/60 | HR 68 | Temp 98.6°F | Resp 16 | Wt 296.0 lb

## 2014-02-07 DIAGNOSIS — E119 Type 2 diabetes mellitus without complications: Secondary | ICD-10-CM

## 2014-02-07 DIAGNOSIS — E559 Vitamin D deficiency, unspecified: Secondary | ICD-10-CM

## 2014-02-07 DIAGNOSIS — M25519 Pain in unspecified shoulder: Secondary | ICD-10-CM

## 2014-02-07 DIAGNOSIS — M25512 Pain in left shoulder: Secondary | ICD-10-CM

## 2014-02-07 MED ORDER — ONETOUCH ULTRASOFT LANCETS MISC: Status: DC

## 2014-02-07 MED ORDER — CANAGLIFLOZIN 300 MG PO TABS: 300.0000 mg | ORAL_TABLET | Freq: Every day | ORAL | Status: DC

## 2014-02-07 MED ORDER — GLUCOSE BLOOD VI STRP: ORAL_STRIP | Status: DC
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
## 2014-02-07 NOTE — Assessment & Plan Note (Signed)
Add Invokana

## 2014-02-07 NOTE — Assessment & Plan Note (Signed)
Continue with current prescription therapy as reflected on the Med list.  

## 2014-02-07 NOTE — Progress Notes (Signed)
Pre visit review using our clinic review tool, if applicable. No additional management support is needed unless otherwise documented below in the visit note. 
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
## 2014-03-23 ENCOUNTER — Emergency Department (HOSPITAL_COMMUNITY): Payer: 59

## 2014-03-23 ENCOUNTER — Emergency Department (HOSPITAL_COMMUNITY)
Admission: EM | Admit: 2014-03-23 | Discharge: 2014-03-24 | Disposition: A | Discharge status: Home / Self Care | Payer: 59 | Attending: Emergency Medicine | Admitting: Emergency Medicine

## 2014-03-23 ENCOUNTER — Encounter (HOSPITAL_COMMUNITY): Payer: Self-pay | Admitting: Emergency Medicine

## 2014-03-23 DIAGNOSIS — J45901 Unspecified asthma with (acute) exacerbation: Secondary | ICD-10-CM | POA: Insufficient documentation

## 2014-03-23 DIAGNOSIS — E119 Type 2 diabetes mellitus without complications: Secondary | ICD-10-CM | POA: Insufficient documentation

## 2014-03-23 DIAGNOSIS — R519 Headache, unspecified: Secondary | ICD-10-CM

## 2014-03-23 DIAGNOSIS — Z8659 Personal history of other mental and behavioral disorders: Secondary | ICD-10-CM | POA: Insufficient documentation

## 2014-03-23 DIAGNOSIS — Z79899 Other long term (current) drug therapy: Secondary | ICD-10-CM | POA: Diagnosis not present

## 2014-03-23 DIAGNOSIS — E559 Vitamin D deficiency, unspecified: Secondary | ICD-10-CM | POA: Insufficient documentation

## 2014-03-23 DIAGNOSIS — Z3202 Encounter for pregnancy test, result negative: Secondary | ICD-10-CM | POA: Insufficient documentation

## 2014-03-23 DIAGNOSIS — R079 Chest pain, unspecified: Secondary | ICD-10-CM | POA: Diagnosis not present

## 2014-03-23 DIAGNOSIS — R0600 Dyspnea, unspecified: Secondary | ICD-10-CM

## 2014-03-23 DIAGNOSIS — D649 Anemia, unspecified: Secondary | ICD-10-CM | POA: Insufficient documentation

## 2014-03-23 DIAGNOSIS — E538 Deficiency of other specified B group vitamins: Secondary | ICD-10-CM | POA: Insufficient documentation

## 2014-03-23 DIAGNOSIS — R51 Headache: Secondary | ICD-10-CM | POA: Insufficient documentation

## 2014-03-23 DIAGNOSIS — E669 Obesity, unspecified: Secondary | ICD-10-CM | POA: Insufficient documentation

## 2014-03-23 DIAGNOSIS — G4730 Sleep apnea, unspecified: Secondary | ICD-10-CM | POA: Insufficient documentation

## 2014-03-23 DIAGNOSIS — I10 Essential (primary) hypertension: Secondary | ICD-10-CM | POA: Diagnosis not present

## 2014-03-23 DIAGNOSIS — K219 Gastro-esophageal reflux disease without esophagitis: Secondary | ICD-10-CM | POA: Diagnosis not present

## 2014-03-23 LAB — I-STAT TROPONIN, ED: Troponin i, poc: 0 ng/mL (ref 0.00–0.08)

## 2014-03-23 LAB — BASIC METABOLIC PANEL
Anion gap: 15 (ref 5–15)
BUN: 12 mg/dL (ref 6–23)
CHLORIDE: 96 meq/L (ref 96–112)
CO2: 23 meq/L (ref 19–32)
CREATININE: 0.86 mg/dL (ref 0.50–1.10)
Calcium: 9.9 mg/dL (ref 8.4–10.5)
GFR calc Af Amer: >90 mL/min (ref >90)
GFR calc non Af Amer: 82 mL/min — ABNORMAL LOW (ref >90)
Glucose, Bld: 267 mg/dL — ABNORMAL HIGH (ref 70–99)
Potassium: 4.1 mEq/L (ref 3.7–5.3)
Sodium: 134 mEq/L — ABNORMAL LOW (ref 137–147)

## 2014-03-23 LAB — CBC
HCT: 32.5 % — ABNORMAL LOW (ref 36.0–46.0)
Hemoglobin: 10.3 g/dL — ABNORMAL LOW (ref 12.0–15.0)
MCH: 24.2 pg — ABNORMAL LOW (ref 26.0–34.0)
MCHC: 31.7 g/dL (ref 30.0–36.0)
MCV: 76.5 fL — ABNORMAL LOW (ref 78.0–100.0)
Platelets: 364 10*3/uL (ref 150–400)
RBC: 4.25 MIL/uL (ref 3.87–5.11)
RDW: 15.2 % (ref 11.5–15.5)
WBC: 9.2 10*3/uL (ref 4.0–10.5)

## 2014-03-23 LAB — POC URINE PREG, ED: PREG TEST UR: NEGATIVE

## 2014-03-23 LAB — PRO B NATRIURETIC PEPTIDE: PRO B NATRI PEPTIDE: 50.9 pg/mL (ref 0–125)

## 2014-03-23 LAB — D-DIMER, QUANTITATIVE: D-Dimer, Quant: 0.33 ug/mL-FEU (ref 0.00–0.48)

## 2014-03-23 MED ORDER — ALBUTEROL SULFATE (2.5 MG/3ML) 0.083% IN NEBU
5.0000 mg | INHALATION_SOLUTION | Freq: Once | RESPIRATORY_TRACT | Status: AC
  Administered 2014-03-23: 5 mg via RESPIRATORY_TRACT
  Filled 2014-03-23: qty 6

## 2014-03-23 NOTE — ED Notes (Signed)
Pt arrived to the ED with a complaint of chest pain and shortness of breath.  Pt states pain has subsided since it began around 1600 hrs today.  Pt states pain is a pressure located in the left upper chest area without radiation

## 2014-03-23 NOTE — ED Provider Notes (Signed)
CSN: [id number]     Arrival date & time 03/23/14  2143 History   First MD Initiated Contact with Patient 03/23/14 2204     Chief Complaint  Patient presents with  . Chest Pain  . Shortness of Breath   headache   (Consider location/radiation/quality/duration/timing/severity/associated sxs/prior Treatment) HPI 43 year old female presents to the emergency department for 3 different complaints, she states over the last 2 months she has had constant gradual onset gradually worsening nasal congestion despite Allegra with chronic shortness of breath gradually worsening especially the last 2 days but has not used her inhaler that she used previously for asthma, she has sleep apnea and uses her CPAP nightly, she has had no cough no fever, her second concern is that over the course of 3 hours this evening she had multiple episodes of a mild chest pressure that lasted a few seconds each time it was not sharp or stabbing nor pleuritic or exertional, her third concern is that for about 6 hours today she had an episode of a headache that was gradual onset gradually built up became severe and gradually resolved completely, she is no headache now, when she had her headache she had no change in speech vision swallowing or understanding she also is no lateralizing weakness numbness or incoordination. She has had a negative CT for pulmonary embolism previously. She has no history of DVT or PE or cancer or recent immobilization. She does have a history of diabetes and hypertension. She is no headache now no chest pain now. Past Medical History  Diagnosis Date  . Anemia     iron deficiency  . Obesity     BMI=44  . Vitamin B12 deficiency   . Vitamin D deficiency   . Asthma   . Sleep apnea     on CPAP  . Depression 2011  . Diabetes mellitus 2010    type II  . GERD (gastroesophageal reflux disease) 2004  . Hypertension 2008   Past Surgical History  Procedure Laterality Date  . Fibroidectomy  2002  .  Tonsillectomy and adenoidectomy    . Cesarean section    . Myomectomy     Family History  Problem Relation Age of Onset  . Hypertension Mother     father, sisters  . Diabetes Mother     sister  . Kidney cancer Mother   . Cancer Mother   . Cholelithiasis Sister   . Clotting disorder Daughter    History  Substance Use Topics  . Smoking status: Never Smoker   . Smokeless tobacco: Never Used  . Alcohol Use: No   OB History   Grav Para Term Preterm Abortions TAB SAB Ect Mult Living                 Review of Systems 10 Systems reviewed and are negative for acute change except as noted in the HPI.   Allergies  Codeine sulfate; Hydrochlorothiazide w-triamterene; Oxycodone-acetaminophen; and Victoza  Home Medications   Prior to Admission medications   Medication Sig Start Date End Date Taking? Authorizing Provider  cholecalciferol (VITAMIN D) 1000 UNITS tablet Take 1,000 Units by mouth every other day.   Yes Historical Provider, MD  ferrous sulfate 325 (65 FE) MG tablet Take 325 mg by mouth daily with breakfast.   Yes Historical Provider, MD  ibuprofen (ADVIL,MOTRIN) 800 MG tablet Take 800 mg by mouth as needed.     Yes Historical Provider, MD  pantoprazole (PROTONIX) 40 MG tablet Take 1 tablet (40  mg total) by mouth daily. 09/23/13  Yes Tresa Garter, MD  [facility] 1/35 tablet Take 1 tablet by mouth every morning. 02/04/14  Yes Historical Provider, MD  ranitidine (ZANTAC) 300 MG tablet Take 1 tablet (300 mg total) by mouth at bedtime. 04/15/13  Yes Aleksei Plotnikov V, MD  SitaGLIPtin-MetFORMIN HCl (JANUMET XR) [phone] MG TB24 Take 1 each by mouth daily. 11/29/13  Yes Aleksei Plotnikov V, MD  spironolactone (ALDACTONE) 100 MG tablet Take 1 tablet by mouth  daily 06/25/13  Yes Aleksei Plotnikov V, MD  vitamin B-12 (CYANOCOBALAMIN) 1000 MCG tablet Take 1,000 mcg by mouth daily.   Yes Historical Provider, MD  diphenhydrAMINE (BENADRYL) 25 MG tablet Take 1 tablet (25 mg total) by  mouth every 6 (six) hours as needed for itching or allergies (hives). 03/31/14   Aleksei Plotnikov V, MD  glucose blood (ONETOUCH VERIO) test strip Use as instructed 02/07/14   Jacinta Shoe V, MD  Lancets (ONETOUCH ULTRASOFT) lancets Use as instructed 02/07/14   Jacinta Shoe V, MD  pseudoephedrine (SUDAFED) 120 MG 12 hr tablet Take 1 tablet (120 mg total) by mouth 2 (two) times daily. For hives 03/31/14   Jacinta Shoe V, MD  triamcinolone cream (KENALOG) 0.5 % Apply 1 application topically 3 (three) times daily. On rash 03/31/14   Jacinta Shoe V, MD   BP 122/82  Pulse 83  Temp(Src) 99.1 F (37.3 C) (Oral)  Resp 18  Ht  (1.676 m)  Wt 297 lb (134.718 kg)  BMI 47.96 kg/m2  SpO2 97%  LMP 03/10/2014 Physical Exam  Nursing note and vitals reviewed. Constitutional:  Awake, alert, nontoxic appearance with baseline speech for patient.  HENT:  Head: Atraumatic.  Mouth/Throat: No oropharyngeal exudate.  Eyes: EOM are normal. Pupils are equal, round, and reactive to light. Right eye exhibits no discharge. Left eye exhibits no discharge.  Neck: Neck supple.  Cardiovascular: Normal rate and regular rhythm.   No murmur heard. Pulmonary/Chest: Effort normal. No stridor. No respiratory distress. She has no wheezes. She has no rales. She exhibits no tenderness.  Decreased breath sounds bilaterally but no audible wheezing crackles rhonchi and no retractions or accessory muscle usage with pulse oximetry normal on room air 98%  Abdominal: Soft. Bowel sounds are normal. She exhibits no mass. There is no tenderness. There is no rebound.  Musculoskeletal: She exhibits no edema and no tenderness.  Baseline ROM, moves extremities with no obvious new focal weakness.  Lymphadenopathy:    She has no cervical adenopathy.  Neurological: She is alert.  Awake, alert, cooperative and aware of situation; motor strength 5/5 bilaterally; sensation normal to light touch bilaterally; peripheral  visual fields full to confrontation; no facial asymmetry; tongue midline; major cranial nerves appear intact; no pronator drift, normal finger to nose bilaterally, baseline gait without new ataxia.  Skin: No rash noted.  Psychiatric: She has a normal mood and affect.    ED Course  Procedures (including critical care time) Low risk Wells PTP PE Cannot apply PERC since P >100 in room. HEART <3 without troponin. Patient / Family / Caregiver understand and agree with initial ED impression and plan with expectations set for ED visit. Patient feels unchanged after albuterol nebulizer and lungs still clear so not obvious bronchospasm. D-dimer negative sodium pulmonary embolism. Await serial troponin markers. 2335  Hand-off to Dr. Ranae Palms. 0040 Labs Review Labs Reviewed  CBC - Abnormal; Notable for the following:    Hemoglobin 10.3 (*)    HCT 32.5 (*)  MCV 76.5 (*)    MCH 24.2 (*)    All other components within normal limits  BASIC METABOLIC PANEL - Abnormal; Notable for the following:    Sodium 134 (*)    Glucose, Bld 267 (*)    GFR calc non Af Amer 82 (*)    All other components within normal limits  PRO B NATRIURETIC PEPTIDE  D-DIMER, QUANTITATIVE  I-STAT TROPOININ, ED  POC URINE PREG, ED  I-STAT TROPOININ, ED    Imaging Review No results found.   EKG Interpretation   Date/Time:  Sunday March 23 2014 22:01:54 EDT Ventricular Rate:  90 PR Interval:  143 QRS Duration: 90 QT Interval:  360 QTC Calculation: 440 R Axis:   44 Text Interpretation:  Sinus rhythm Minimal ST depression, inferior leads  ED PHYSICIAN INTERPRETATION AVAILABLE IN [facility] Confirmed by  TEST, Record ([id number]) on 03/25/2014 7:14:57 AM     ECG: Muse not working: Sinus rhythm, ventricular rate 90, normal axis, nonspecific ST depression inferiorly, no significant change noted compared with prior ECG MDM   Final diagnoses:  Dyspnea  Chest pain, unspecified chest pain type  Acute nonintractable  headache, unspecified headache type    Dispo pnd.    Hurman Horn, MD 04/01/14 [phone]

## 2014-03-24 LAB — I-STAT TROPONIN, ED: Troponin i, poc: 0 ng/mL (ref 0.00–0.08)

## 2014-03-24 MED ORDER — PREDNISONE 50 MG PO TABS: 50.0000 mg | ORAL_TABLET | Freq: Every day | ORAL | Status: DC

## 2014-03-24 MED ORDER — PREDNISONE 20 MG PO TABS
60.0000 mg | ORAL_TABLET | Freq: Once | ORAL | Status: AC
  Administered 2014-03-24: 60 mg via ORAL
  Filled 2014-03-24: qty 3

## 2014-03-24 NOTE — Discharge Instructions (Signed)
Your caregiver has diagnosed you as having chest pain that is not specific for one problem, but does not require admission.  You are at low risk for an acute heart condition or other serious illness. Chest pain comes from many different causes.  SEEK IMMEDIATE MEDICAL ATTENTION IF: You have severe chest pain, especially if the pain is crushing or pressure-like and spreads to the arms, back, neck, or jaw, or if you have sweating, nausea (feeling sick to your stomach), or shortness of breath. THIS IS AN EMERGENCY. Don't wait to see if the pain will go away. Get medical help at once. Call 911 or 0 (operator). DO NOT drive yourself to the hospital.  Your chest pain gets worse and does not go away with rest.  You have an attack of chest pain lasting longer than usual, despite rest and treatment with the medications your caregiver has prescribed.  You wake from sleep with chest pain or shortness of breath.  You feel dizzy or faint.  You have chest pain not typical of your usual pain for which you originally saw your caregiver.  You had a headache. No specific cause was found today for your headache. It may have been a migraine or other cause of headache. Stress, anxiety, fatigue, and depression are common triggers for headaches. Your headache today does not appear to be life-threatening or require hospitalization, but often the exact cause of headaches is not determined in the emergency department. Therefore, follow-up with your doctor is very important to find out what may have caused your headache, and whether or not you need any further diagnostic testing or treatment. Sometimes headaches can appear benign (not harmful), but then more serious symptoms can develop which should prompt an immediate re-evaluation by your doctor or the emergency department. SEEK MEDICAL ATTENTION IF: You develop possible problems with medications prescribed.  The medications don't resolve your headache, if it recurs , or if you  have multiple episodes of vomiting or can't take fluids. You have a change from the usual headache. RETURN IMMEDIATELY IF you develop a sudden, severe headache or confusion, become poorly responsive or faint, develop a fever above 100.38F or problem breathing, have a change in speech, vision, swallowing, or understanding, or develop new weakness, numbness, tingling, incoordination, or have a seizure.

## 2014-03-24 NOTE — ED Provider Notes (Signed)
Patient signed out pending repeat troponin. She states she was having chest tightness in the left upper chest yesterday. She states they only lasted a few seconds. There is no radiation. She been evaluated for several months of dyspnea. She states she's had mild cough without production. Patient has long history of seasonal allergies for which she is followed by an allergist. She had smoke exposure in July at which she states worsened her dyspnea. She is to see her allergist on Tuesday.  Her workup in the emergency department has been negative for any acute findings. She has a negative d-dimer, chest x-ray demonstrating chronic bronchitic changes, and EKG it is unchanged from previous, and troponins x2 that are negative. Discussed findings at length with the patient. Her symptoms are likely related to allergies. She is already taking Allegra. We'll try a short course of steroids. She'll followup with her allergist on Tuesday. Do not believe that the third troponin is necessary to rule out AMI given atypical nature of the chest pain, 2 serial troponins and an EKG unchanged from previous. Discussed with patient and states she would like to be discharged home. Have given strict return precautions and has voice understanding.  Loren Racer, MD 03/24/14 [phone]

## 2014-03-30 ENCOUNTER — Emergency Department (HOSPITAL_COMMUNITY)
Admission: EM | Admit: 2014-03-30 | Discharge: 2014-03-30 | Disposition: A | Discharge status: Home / Self Care | Payer: 59 | Attending: Emergency Medicine | Admitting: Emergency Medicine

## 2014-03-30 ENCOUNTER — Encounter (HOSPITAL_COMMUNITY): Payer: Self-pay | Admitting: Emergency Medicine

## 2014-03-30 DIAGNOSIS — I10 Essential (primary) hypertension: Secondary | ICD-10-CM | POA: Diagnosis not present

## 2014-03-30 DIAGNOSIS — R21 Rash and other nonspecific skin eruption: Secondary | ICD-10-CM | POA: Diagnosis present

## 2014-03-30 DIAGNOSIS — Z8659 Personal history of other mental and behavioral disorders: Secondary | ICD-10-CM | POA: Diagnosis not present

## 2014-03-30 DIAGNOSIS — IMO0002 Reserved for concepts with insufficient information to code with codable children: Secondary | ICD-10-CM | POA: Insufficient documentation

## 2014-03-30 DIAGNOSIS — Z79899 Other long term (current) drug therapy: Secondary | ICD-10-CM | POA: Insufficient documentation

## 2014-03-30 DIAGNOSIS — J45909 Unspecified asthma, uncomplicated: Secondary | ICD-10-CM | POA: Insufficient documentation

## 2014-03-30 DIAGNOSIS — K219 Gastro-esophageal reflux disease without esophagitis: Secondary | ICD-10-CM | POA: Diagnosis not present

## 2014-03-30 DIAGNOSIS — E119 Type 2 diabetes mellitus without complications: Secondary | ICD-10-CM | POA: Insufficient documentation

## 2014-03-30 DIAGNOSIS — E538 Deficiency of other specified B group vitamins: Secondary | ICD-10-CM | POA: Diagnosis not present

## 2014-03-30 DIAGNOSIS — L509 Urticaria, unspecified: Secondary | ICD-10-CM | POA: Insufficient documentation

## 2014-03-30 DIAGNOSIS — D649 Anemia, unspecified: Secondary | ICD-10-CM | POA: Diagnosis not present

## 2014-03-30 DIAGNOSIS — E669 Obesity, unspecified: Secondary | ICD-10-CM | POA: Insufficient documentation

## 2014-03-30 MED ORDER — DEXAMETHASONE SODIUM PHOSPHATE 10 MG/ML IJ SOLN
10.0000 mg | Freq: Once | INTRAMUSCULAR | Status: AC
  Administered 2014-03-30: 10 mg via INTRAMUSCULAR
  Filled 2014-03-30: qty 1

## 2014-03-30 MED ORDER — HYDROXYZINE HCL 25 MG PO TABS: 25.0000 mg | ORAL_TABLET | Freq: Four times a day (QID) | ORAL | Status: DC

## 2014-03-30 NOTE — ED Notes (Signed)
Pt. Stated, I've been taking Benadryl, allegra, and I was given last Sunday for breathing "Prednisone"  Friday i started itching and breaking out in hives. Nothing is helping.

## 2014-03-30 NOTE — Discharge Instructions (Signed)
Take Atarax as directed as needed for itching. You may continue to take Pepcid.  Hives Hives are itchy, red, swollen areas of the skin. They can vary in size and location on your body. Hives can come and go for hours or several days (acute hives) or for several weeks (chronic hives). Hives do not spread from person to person (noncontagious). They may get worse with scratching, exercise, and emotional stress. CAUSES   Allergic reaction to food, additives, or drugs.  Infections, including the common cold.  Illness, such as vasculitis, lupus, or thyroid disease.  Exposure to sunlight, heat, or cold.  Exercise.  Stress.  Contact with chemicals. SYMPTOMS   Red or white swollen patches on the skin. The patches may change size, shape, and location quickly and repeatedly.  Itching.  Swelling of the hands, feet, and face. This may occur if hives develop deeper in the skin. DIAGNOSIS  Your caregiver can usually tell what is wrong by performing a physical exam. Skin or blood tests may also be done to determine the cause of your hives. In some cases, the cause cannot be determined. TREATMENT  Mild cases usually get better with medicines such as antihistamines. Severe cases may require an emergency epinephrine injection. If the cause of your hives is known, treatment includes avoiding that trigger.  HOME CARE INSTRUCTIONS   Avoid causes that trigger your hives.  Take antihistamines as directed by your caregiver to reduce the severity of your hives. Non-sedating or low-sedating antihistamines are usually recommended. Do not drive while taking an antihistamine.  Take any other medicines prescribed for itching as directed by your caregiver.  Wear loose-fitting clothing.  Keep all follow-up appointments as directed by your caregiver. SEEK MEDICAL CARE IF:   You have persistent or severe itching that is not relieved with medicine.  You have painful or swollen joints. SEEK IMMEDIATE  MEDICAL CARE IF:   You have a fever.  Your tongue or lips are swollen.  You have trouble breathing or swallowing.  You feel tightness in the throat or chest.  You have abdominal pain. These problems may be the first sign of a life-threatening allergic reaction. Call your local emergency services (911 in [place] MAKE SURE YOU:   Understand these instructions.  Will watch your condition.  Will get help right away if you are not doing well or get worse. Document Released: 07/18/2005 Document Revised: 07/23/2013 Document Reviewed: 10/11/2011 [facility] Patient Information 2015 [place], [place]. This information is not intended to replace advice given to you by your health care provider. Make sure you discuss any questions you have with your health care provider.  Allergies Allergies may happen from anything your body is sensitive to. This may be food, medicines, pollens, chemicals, and nearly anything around you in everyday life that produces allergens. An allergen is anything that causes an allergy producing substance. Heredity is often a factor in causing these problems. This means you may have some of the same allergies as your parents. Food allergies happen in all age groups. Food allergies are some of the most severe and life threatening. Some common food allergies are cow's milk, seafood, eggs, nuts, wheat, and soybeans. SYMPTOMS   Swelling around the mouth.  An itchy red rash or hives.  Vomiting or diarrhea.  Difficulty breathing. SEVERE ALLERGIC REACTIONS ARE LIFE-THREATENING. This reaction is called anaphylaxis. It can cause the mouth and throat to swell and cause difficulty with breathing and swallowing. In severe reactions only a trace amount of food (for  example, peanut oil in a salad) may cause death within seconds. Seasonal allergies occur in all age groups. These are seasonal because they usually occur during the same season every year. They may be a reaction to molds,  grass pollens, or tree pollens. Other causes of problems are house dust mite allergens, pet dander, and mold spores. The symptoms often consist of nasal congestion, a runny itchy nose associated with sneezing, and tearing itchy eyes. There is often an associated itching of the mouth and ears. The problems happen when you come in contact with pollens and other allergens. Allergens are the particles in the air that the body reacts to with an allergic reaction. This causes you to release allergic antibodies. Through a chain of events, these eventually cause you to release histamine into the blood stream. Although it is meant to be protective to the body, it is this release that causes your discomfort. This is why you were given anti-histamines to feel better. If you are unable to pinpoint the offending allergen, it may be determined by skin or blood testing. Allergies cannot be cured but can be controlled with medicine. Hay fever is a collection of all or some of the seasonal allergy problems. It may often be treated with simple over-the-counter medicine such as diphenhydramine. Take medicine as directed. Do not drink alcohol or drive while taking this medicine. Check with your caregiver or package insert for child dosages. If these medicines are not effective, there are many new medicines your caregiver can prescribe. Stronger medicine such as nasal spray, eye drops, and corticosteroids may be used if the first things you try do not work well. Other treatments such as immunotherapy or desensitizing injections can be used if all else fails. Follow up with your caregiver if problems continue. These seasonal allergies are usually not life threatening. They are generally more of a nuisance that can often be handled using medicine. HOME CARE INSTRUCTIONS   If unsure what causes a reaction, keep a diary of foods eaten and symptoms that follow. Avoid foods that cause reactions.  If hives or rash are present:  Take  medicine as directed.  You may use an over-the-counter antihistamine (diphenhydramine) for hives and itching as needed.  Apply cold compresses (cloths) to the skin or take baths in cool water. Avoid hot baths or showers. Heat will make a rash and itching worse.  If you are severely allergic:  Following a treatment for a severe reaction, hospitalization is often required for closer follow-up.  Wear a medic-alert bracelet or necklace stating the allergy.  You and your family must learn how to give adrenaline or use an anaphylaxis kit.  If you have had a severe reaction, always carry your anaphylaxis kit or EpiPen with you. Use this medicine as directed by your caregiver if a severe reaction is occurring. Failure to do so could have a fatal outcome. SEEK MEDICAL CARE IF:  You suspect a food allergy. Symptoms generally happen within 30 minutes of eating a food.  Your symptoms have not gone away within 2 days or are getting worse.  You develop new symptoms.  You want to retest yourself or your child with a food or drink you think causes an allergic reaction. Never do this if an anaphylactic reaction to that food or drink has happened before. Only do this under the care of a caregiver. SEEK IMMEDIATE MEDICAL CARE IF:   You have difficulty breathing, are wheezing, or have a tight feeling in your chest or  throat.  You have a swollen mouth, or you have hives, swelling, or itching all over your body.  You have had a severe reaction that has responded to your anaphylaxis kit or an EpiPen. These reactions may return when the medicine has worn off. These reactions should be considered life threatening. MAKE SURE YOU:   Understand these instructions.  Will watch your condition.  Will get help right away if you are not doing well or get worse. Document Released: 10/11/2002 Document Revised: 11/12/2012 Document Reviewed: 03/17/2008 [facility] Patient Information 2015 [place], [place]. This  information is not intended to replace advice given to you by your health care provider. Make sure you discuss any questions you have with your health care provider.

## 2014-03-30 NOTE — ED Provider Notes (Signed)
CSN: [id number]     Arrival date & time 03/30/14  0910 History  This chart was scribed for Johnnette Gourd, PA-C, working with Vanetta Mulders, MD by Chestine Spore, ED Scribe. The patient was seen in room TR07C/TR07C at 9:24 AM.    Chief Complaint  Patient presents with  . Allergic Reaction      The history is provided by the patient. No language interpreter was used.   HPI Comments: Julia Estrada is a 43 y.o. female with a medical hx of type II DM and HTN who presents to the Emergency Department complaining of an allergic reaction onset 2 days ago. She states that she has tried Benadryl, Pecid, and Allegra with no relief for her symptoms. She states that she was given Prednisone on 8/23, when she presented with SOB to the ED. She states that she took Benadryl two hours ago with no help. She states two days ago she started itching and breaking out in hives. She states that nothing that she takes is helping with her symptoms. She states that she has not taken Prednisone since onset of hives, which have since worsened. Her last dose of prednisone was supposed to be 1 day ago. She denies any new soaps or detergents. She states that no one else is itching around her. She states that she thinks that it is on her back, legs, and arms. She states that similar symptoms happened when she was 18 and she was rushed to the hospital because it cut off her breathing. She denies any other associated symptoms.  Past Medical History  Diagnosis Date  . Anemia     iron deficiency  . Obesity     BMI=44  . Vitamin B12 deficiency   . Vitamin D deficiency   . Asthma   . Sleep apnea     on CPAP  . Depression 2011  . Diabetes mellitus 2010    type II  . GERD (gastroesophageal reflux disease) 2004  . Hypertension 2008   Past Surgical History  Procedure Laterality Date  . Fibroidectomy  2002  . Tonsillectomy and adenoidectomy    . Cesarean section    . Myomectomy     Family History  Problem Relation Age of  Onset  . Hypertension Mother     father, sisters  . Diabetes Mother     sister  . Kidney cancer Mother   . Cancer Mother   . Cholelithiasis Sister   . Clotting disorder Daughter    History  Substance Use Topics  . Smoking status: Never Smoker   . Smokeless tobacco: Never Used  . Alcohol Use: No   OB History   Grav Para Term Preterm Abortions TAB SAB Ect Mult Living                 Review of Systems  Skin: Positive for rash (hives present on arms and back).    A complete 10 system review of systems was obtained and all systems are negative except as noted in the HPI and PMH.    Allergies  Codeine sulfate; Hydrochlorothiazide w-triamterene; Oxycodone-acetaminophen; and Victoza  Home Medications   Prior to Admission medications   Medication Sig Start Date End Date Taking? Authorizing Provider  cholecalciferol (VITAMIN D) 1000 UNITS tablet Take 1,000 Units by mouth every other day.    Historical Provider, MD  ferrous sulfate 325 (65 FE) MG tablet Take 325 mg by mouth daily with breakfast.    Historical Provider, MD  glucose  blood (ONETOUCH VERIO) test strip Use as instructed 02/07/14   Jacinta Shoe V, MD  hydrOXYzine (ATARAX/VISTARIL) 25 MG tablet Take 1 tablet (25 mg total) by mouth every 6 (six) hours. 03/30/14   Trevor Mace, PA-C  ibuprofen (ADVIL,MOTRIN) 800 MG tablet Take 800 mg by mouth as needed.      Historical Provider, MD  Lancets Letta Pate ULTRASOFT) lancets Use as instructed 02/07/14   Jacinta Shoe V, MD  pantoprazole (PROTONIX) 40 MG tablet Take 1 tablet (40 mg total) by mouth daily. 09/23/13   Tresa Garter, MD  [facility] 1/35 tablet Take 1 tablet by mouth every morning. 02/04/14   Historical Provider, MD  predniSONE (DELTASONE) 50 MG tablet Take 1 tablet (50 mg total) by mouth daily. 03/24/14   Loren Racer, MD  ranitidine (ZANTAC) 300 MG tablet Take 1 tablet (300 mg total) by mouth at bedtime. 04/15/13   Aleksei Plotnikov V, MD   SitaGLIPtin-MetFORMIN HCl (JANUMET XR) [phone] MG TB24 Take 1 each by mouth daily. 11/29/13   Aleksei Plotnikov V, MD  spironolactone (ALDACTONE) 100 MG tablet Take 1 tablet by mouth  daily 06/25/13   Jacinta Shoe V, MD  valACYclovir (VALTREX) 1000 MG tablet Take 1,000 mg by mouth daily.      Historical Provider, MD  vitamin B-12 (CYANOCOBALAMIN) 1000 MCG tablet Take 1,000 mcg by mouth daily.    Historical Provider, MD   BP 169/102  Pulse 106  Temp(Src) 97.6 F (36.4 C) (Oral)  Resp 19  SpO2 100%  LMP 03/10/2014  Physical Exam  Nursing note and vitals reviewed. Constitutional: She is oriented to person, place, and time. She appears well-developed and well-nourished. No distress.  HENT:  Head: Normocephalic and atraumatic.  Mouth/Throat: Oropharynx is clear and moist.  Eyes: Conjunctivae and EOM are normal.  Neck: Normal range of motion. Neck supple.  Cardiovascular: Normal rate, regular rhythm and normal heart sounds.   Pulmonary/Chest: Effort normal and breath sounds normal. No respiratory distress.  Musculoskeletal: Normal range of motion. She exhibits no edema.  Neurological: She is alert and oriented to person, place, and time. No sensory deficit.  Skin: Skin is warm and dry. Rash noted.  Scattered urticaria on bilateral arms and back. Spares palms of hands and no mucosal lesions.  Psychiatric: She has a normal mood and affect. Her behavior is normal.    ED Course  Procedures (including critical care time) DIAGNOSTIC STUDIES: Oxygen Saturation is 100% on room air, normal by my interpretation.    COORDINATION OF CARE: 9:30 AM-Discussed treatment plan which includes Decadron injection with pt at bedside and pt agreed to plan.   Labs Review Labs Reviewed - No data to display Filed Vitals:   03/30/14 0943  BP: 159/83  Pulse: 92  Temp:   Resp: 17     Imaging Review No results found.   EKG Interpretation None      MDM   Final diagnoses:  Urticaria    Patient presenting with urticaria. She is nontoxic-appearing and in no apparent distress. Hypertensive, vitals otherwise stable.No respiratory or airway compromise. I do not feel this is a reaction to prednisone as the urticaria worsened when she discontinued taking prednisone. IM Decadron given in the emergency department. Will discharge home with Atarax. Advised her to continue taking Pepcid. Admits to history of hypertension, she's been off of her medications for few months. BP recheck improved. Discussed importance of medication compliance. Followup with PCP. Stable for discharge. Return precautions given. Patient states understanding of treatment  care plan and is agreeable.  I personally performed the services described in this documentation, which was scribed in my presence. The recorded information has been reviewed and is accurate.    Trevor Mace, PA-C 03/30/14 [phone]

## 2014-03-30 NOTE — ED Notes (Signed)
Pt. Refused a wheelchair 

## 2014-03-31 ENCOUNTER — Telehealth: Payer: Self-pay | Admitting: *Deleted

## 2014-03-31 ENCOUNTER — Encounter: Payer: Self-pay | Admitting: Internal Medicine

## 2014-03-31 ENCOUNTER — Ambulatory Visit (INDEPENDENT_AMBULATORY_CARE_PROVIDER_SITE_OTHER): Payer: 59 | Admitting: Internal Medicine

## 2014-03-31 VITALS — BP 116/78 | HR 82 | Temp 98.3°F | Resp 12 | Ht 66.0 in | Wt 295.0 lb

## 2014-03-31 DIAGNOSIS — E669 Obesity, unspecified: Secondary | ICD-10-CM

## 2014-03-31 DIAGNOSIS — E559 Vitamin D deficiency, unspecified: Secondary | ICD-10-CM

## 2014-03-31 DIAGNOSIS — E538 Deficiency of other specified B group vitamins: Secondary | ICD-10-CM

## 2014-03-31 DIAGNOSIS — E119 Type 2 diabetes mellitus without complications: Secondary | ICD-10-CM

## 2014-03-31 MED ORDER — PSEUDOEPHEDRINE HCL ER 120 MG PO TB12: 120.0000 mg | ORAL_TABLET | Freq: Two times a day (BID) | ORAL | Status: DC

## 2014-03-31 MED ORDER — TRIAMCINOLONE ACETONIDE 0.5 % EX CREA: 1.0000 "application " | TOPICAL_CREAM | Freq: Three times a day (TID) | CUTANEOUS | Status: DC

## 2014-03-31 MED ORDER — DIPHENHYDRAMINE HCL 25 MG PO TABS: 25.0000 mg | ORAL_TABLET | Freq: Four times a day (QID) | ORAL | Status: DC | PRN

## 2014-03-31 NOTE — Telephone Encounter (Signed)
Left msg on triage stating she has been breaking out in hives. Went to ED was given atarix but med is not helping. Called pt back inform her need to f/u with Dr. Posey Rea made appt for this afternoon @ 1:30...Raechel Chute

## 2014-03-31 NOTE — ED Provider Notes (Signed)
Medical screening examination/treatment/procedure(s) were performed by non-physician practitioner and as supervising physician I was immediately available for consultation/collaboration.   EKG Interpretation None        Ladona Rosten, MD 03/31/14 1123 

## 2014-03-31 NOTE — Progress Notes (Signed)
Pre visit review using our clinic review tool, if applicable. No additional management support is needed unless otherwise documented below in the visit note. 
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
## 2014-04-03 ENCOUNTER — Other Ambulatory Visit (HOSPITAL_COMMUNITY): Payer: Self-pay | Admitting: Orthopedic Surgery

## 2014-04-07 NOTE — Assessment & Plan Note (Signed)
Continue with current prescription therapy as reflected on the Med list.  

## 2014-04-07 NOTE — Assessment & Plan Note (Signed)
Wt Readings from Last 3 Encounters:  03/31/14 295 lb 0.6 oz (133.829 kg)  03/23/14 297 lb (134.718 kg)  02/07/14 296 lb (134.265 kg)

## 2014-04-10 ENCOUNTER — Ambulatory Visit (INDEPENDENT_AMBULATORY_CARE_PROVIDER_SITE_OTHER): Payer: 59 | Admitting: Pulmonary Disease

## 2014-04-10 ENCOUNTER — Encounter: Payer: Self-pay | Admitting: Pulmonary Disease

## 2014-04-10 VITALS — BP 122/88 | HR 91 | Ht 66.0 in | Wt 293.0 lb

## 2014-04-10 DIAGNOSIS — Z9989 Dependence on other enabling machines and devices: Secondary | ICD-10-CM

## 2014-04-10 DIAGNOSIS — R0600 Dyspnea, unspecified: Secondary | ICD-10-CM

## 2014-04-10 DIAGNOSIS — J45909 Unspecified asthma, uncomplicated: Secondary | ICD-10-CM

## 2014-04-10 DIAGNOSIS — R0609 Other forms of dyspnea: Secondary | ICD-10-CM

## 2014-04-10 DIAGNOSIS — R0989 Other specified symptoms and signs involving the circulatory and respiratory systems: Secondary | ICD-10-CM

## 2014-04-10 DIAGNOSIS — G4733 Obstructive sleep apnea (adult) (pediatric): Secondary | ICD-10-CM

## 2014-04-10 DIAGNOSIS — J3089 Other allergic rhinitis: Secondary | ICD-10-CM

## 2014-04-10 DIAGNOSIS — IMO0002 Reserved for concepts with insufficient information to code with codable children: Secondary | ICD-10-CM

## 2014-04-10 MED ORDER — FLUTICASONE-SALMETEROL 250-50 MCG/DOSE IN AEPB: 1.0000 | INHALATION_SPRAY | Freq: Two times a day (BID) | RESPIRATORY_TRACT | Status: DC

## 2014-04-10 MED ORDER — FLUTICASONE PROPIONATE 50 MCG/ACT NA SUSP: 2.0000 | Freq: Every day | NASAL | Status: DC

## 2014-04-10 NOTE — Progress Notes (Deleted)
   Subjective:    Patient ID: Julia Estrada, female    DOB: October 24, 1970, 43 y.o.   MRN: [id number]  HPI    Review of Systems  Constitutional: Negative for fever and unexpected weight change.  HENT: Positive for congestion and trouble swallowing. Negative for dental problem, ear pain, nosebleeds, postnasal drip, rhinorrhea, sinus pressure, sneezing and sore throat.   Eyes: Negative for redness and itching.  Respiratory: Positive for cough and shortness of breath. Negative for chest tightness and wheezing.   Cardiovascular: Positive for chest pain. Negative for palpitations and leg swelling.  Gastrointestinal: Negative for nausea and vomiting.       Acid heartburn/indigestion  Genitourinary: Negative for dysuria.  Musculoskeletal: Negative for joint swelling.  Skin: Positive for rash.  Neurological: Negative for headaches.  Hematological: Does not bruise/bleed easily.  Psychiatric/Behavioral: Negative for dysphoric mood. The patient is not nervous/anxious.        Objective:   Physical Exam        Assessment & Plan:

## 2014-04-10 NOTE — Progress Notes (Signed)
Chief Complaint  Patient presents with  . SLEEP CONSULT    Self referral-- last seen 2008 with VS. Epworth Score: 19    History of Present Illness: Julia Estrada is a 43 y.o. female for evaluation of sleep problems and breathing problems.  I last saw Ms. Kritikos in 2012.  At that time she was using CPAP 11 cm H2O for her severe sleep apnea.  She has been using CPAP.  Her machine is more than 43 years old.  She gets her supplies from [facility], and last got a mask a few months ago.  She is using nasal mask >> her mouth opens sometimes when she is asleep.  She has noticed more trouble with her sleep.  She is waking up all the time during the night, and feels sleep during the day.  She is not sure if she is snoring while asleep.  She goes to sleep at at different times during the night.  She falls asleep right away.  She wakes up 3 to 4 times to use the bathroom.  She gets out of bed at 7 am.  She feels tired in the morning.  She denies morning headache.  She does not use anything to help her fall sleep or stay awake.  She denies sleep walking, sleep talking, bruxism, or nightmares.  There is no history of restless legs.  She denies sleep hallucinations, sleep paralysis, or cataplexy.  The Epworth score is 19 out of 24.  She has history of asthma and allergies.  She is followed by Dr. Irena Cords with [place] [facility].  She has been on allergy shots, but has not followed up with this recently.  She still has trouble with allergies.  She notices more trouble with her breathing when her allergies are bad.  She is getting cough and wheeze.  She was having trouble with hives after she took prednisone >> prednisone did help her breathing.  Her breathing got worse after she was exposed to smoke in her home.  She was seen in ER for this in August.  She had CXR then which showed mild chronic bronchitic changes.  Her lab work from then was normal except mild microcytic anemia, and elevated blood sugar.  Tests: PSG  March 2002 >> AHI 77, SpO2 low 86%  Julia Estrada  has a past medical history of Anemia; Obesity; Vitamin B12 deficiency; Vitamin D deficiency; Asthma; Sleep apnea; Depression (2011); Diabetes mellitus (2010); GERD (gastroesophageal reflux disease) (2004); and Hypertension (2008).  Julia Estrada  has past surgical history that includes fibroidectomy (2002); Tonsillectomy and adenoidectomy; Cesarean section; and Myomectomy.  Prior to Admission medications   Medication Sig Start Date End Date Taking? Authorizing Provider  cholecalciferol (VITAMIN D) 1000 UNITS tablet Take 1,000 Units by mouth every other day.   Yes Historical Provider, MD  diphenhydrAMINE (BENADRYL) 25 MG tablet Take 1 tablet (25 mg total) by mouth every 6 (six) hours as needed for itching or allergies (hives). 03/31/14  Yes Aleksei Plotnikov V, MD  ferrous sulfate 325 (65 FE) MG tablet Take 325 mg by mouth daily with breakfast.   Yes Historical Provider, MD  glucose blood (ONETOUCH VERIO) test strip Use as instructed 02/07/14  Yes Aleksei Plotnikov V, MD  hydrOXYzine (ATARAX/VISTARIL) 25 MG tablet Take 1 tablet by mouth as needed. 03/30/14  Yes Historical Provider, MD  ibuprofen (ADVIL,MOTRIN) 800 MG tablet Take 800 mg by mouth as needed.     Yes Historical Provider, MD  Lancets (  ONETOUCH ULTRASOFT) lancets Use as instructed 02/07/14  Yes Aleksei Plotnikov V, MD  metroNIDAZOLE (FLAGYL) 500 MG tablet Take 1 tablet by mouth 2 (two) times daily. 03/24/14  Yes Historical Provider, MD  pantoprazole (PROTONIX) 40 MG tablet Take 1 tablet (40 mg total) by mouth daily. 09/23/13  Yes Tresa Garter, MD  [facility] 1/35 tablet Take 1 tablet by mouth every morning. 02/04/14  Yes Historical Provider, MD  pseudoephedrine (SUDAFED) 120 MG 12 hr tablet Take 1 tablet (120 mg total) by mouth 2 (two) times daily. For hives 03/31/14  Yes Aleksei Plotnikov V, MD  ranitidine (ZANTAC) 300 MG tablet Take 1 tablet (300 mg total) by mouth at bedtime. 04/15/13  Yes  Aleksei Plotnikov V, MD  SitaGLIPtin-MetFORMIN HCl (JANUMET XR) [id number] MG TB24 Take 1 each by mouth daily. 11/29/13  Yes Aleksei Plotnikov V, MD  spironolactone (ALDACTONE) 100 MG tablet Take 1 tablet by mouth  daily 06/25/13  Yes Aleksei Plotnikov V, MD  triamcinolone cream (KENALOG) 0.5 % Apply 1 application topically 3 (three) times daily. On rash 03/31/14  Yes Aleksei Plotnikov V, MD  valACYclovir (VALTREX) 1000 MG tablet Take 1 tablet by mouth daily. 02/02/14  Yes Historical Provider, MD  vitamin B-12 (CYANOCOBALAMIN) 1000 MCG tablet Take 1,000 mcg by mouth daily.   Yes Historical Provider, MD    Allergies  Allergen Reactions  . Codeine Sulfate Nausea And Vomiting  . Hydrochlorothiazide W-Triamterene     REACTION: cramping  . Oxycodone-Acetaminophen Nausea And Vomiting  . Victoza [Liraglutide]     n/v    Her family history includes Cancer in her mother; Cholelithiasis in her sister; Clotting disorder in her daughter; Diabetes in her mother; Hypertension in her mother; Kidney cancer in her mother.  She  reports that she has never smoked. She has never used smokeless tobacco. She reports that she does not drink alcohol or use illicit drugs.  Review of Systems  Constitutional: Negative for fever and unexpected weight change.  HENT: Positive for congestion and trouble swallowing. Negative for dental problem, ear pain, nosebleeds, postnasal drip, rhinorrhea, sinus pressure, sneezing and sore throat.   Eyes: Negative for redness and itching.  Respiratory: Positive for cough and shortness of breath. Negative for chest tightness and wheezing.   Cardiovascular: Positive for chest pain. Negative for palpitations and leg swelling.  Gastrointestinal: Negative for nausea and vomiting.       Acid heartburn/indigestion  Genitourinary: Negative for dysuria.  Musculoskeletal: Negative for joint swelling.  Skin: Positive for rash.  Neurological: Negative for headaches.  Hematological: Does not  bruise/bleed easily.  Psychiatric/Behavioral: Negative for dysphoric mood. The patient is not nervous/anxious.    Physical Exam:  General - No distress ENT - No sinus tenderness, no oral exudate, no LAN, no thyromegaly, TM clear, pupils equal/reactive Cardiac - s1s2 regular, no murmur, pulses symmetric Chest - No wheeze/rales/dullness, good air entry, normal respiratory excursion Back - No focal tenderness Abd - Soft, non-tender, no organomegaly, + bowel sounds Ext - No edema Neuro - Normal strength, cranial nerves intact Skin - No rashes Psych - Normal mood, and behavior  Assessment/plan:  Julia Estrada, M.D. Pager [phone]

## 2014-04-10 NOTE — Patient Instructions (Signed)
Advair one puff twice per day >> rinse mouth after each use Nasal irrigation (salt water nose spray) daily Flonase two sprays each nostril daily Will schedule breathing test (PFT) Will get copy of CPAP report  Follow up in 6 to 8 weeks

## 2014-04-12 DIAGNOSIS — J309 Allergic rhinitis, unspecified: Secondary | ICD-10-CM | POA: Insufficient documentation

## 2014-04-12 DIAGNOSIS — R0600 Dyspnea, unspecified: Secondary | ICD-10-CM | POA: Insufficient documentation

## 2014-04-12 DIAGNOSIS — J4530 Mild persistent asthma, uncomplicated: Secondary | ICD-10-CM | POA: Insufficient documentation

## 2014-04-12 NOTE — Assessment & Plan Note (Signed)
Most likely related to asthma, obesity, and deconditioning.  If her symptoms do not improve with adjustment to asthma regimen, then she will need additional evaluation (Echo, stress test, CT chest).

## 2014-04-12 NOTE — Assessment & Plan Note (Addendum)
She reports compliance with CPAP therapy.  She has noticed more trouble with daytime sleepiness.  I will get a copy of her CPAP download.  Will then decide if she needs further adjustment to her set up.  She might also need new machine if hers is more than 43 years old.  She might also need change to different type of CPAP mask if she has significant air leak.

## 2014-04-12 NOTE — Assessment & Plan Note (Signed)
Advised her to use nasal irrigation and flonase.  She can use OTC anti-histamines as needed.  Advised her to follow up with Dr. Irena Cords to further assess whether she should resume allergy shots.

## 2014-04-12 NOTE — Assessment & Plan Note (Signed)
She previously was on advair, and this helped.  Will resume this.  Will also arrange for PFT's.  She had recent chest xray, and I don't think she needs additional chest xray at this time.

## 2014-04-21 ENCOUNTER — Encounter (HOSPITAL_COMMUNITY): Payer: Self-pay | Admitting: Pharmacy Technician

## 2014-04-22 ENCOUNTER — Encounter (HOSPITAL_COMMUNITY)
Admission: RE | Admit: 2014-04-22 | Discharge: 2014-04-22 | Disposition: A | Discharge status: Home / Self Care | Payer: 59 | Source: Ambulatory Visit | Attending: Orthopedic Surgery | Admitting: Orthopedic Surgery

## 2014-04-22 ENCOUNTER — Encounter (HOSPITAL_COMMUNITY): Payer: Self-pay

## 2014-04-22 DIAGNOSIS — M25819 Other specified joint disorders, unspecified shoulder: Secondary | ICD-10-CM | POA: Insufficient documentation

## 2014-04-22 DIAGNOSIS — Z01812 Encounter for preprocedural laboratory examination: Secondary | ICD-10-CM | POA: Diagnosis present

## 2014-04-22 DIAGNOSIS — M7580 Other shoulder lesions, unspecified shoulder: Principal | ICD-10-CM

## 2014-04-22 HISTORY — DX: Shortness of breath: R06.02

## 2014-04-22 LAB — COMPREHENSIVE METABOLIC PANEL
ALT: 12 U/L (ref 0–35)
ANION GAP: 16 — AB (ref 5–15)
AST: 9 U/L (ref 0–37)
Albumin: 3.1 g/dL — ABNORMAL LOW (ref 3.5–5.2)
Alkaline Phosphatase: 102 U/L (ref 39–117)
BUN: 12 mg/dL (ref 6–23)
CALCIUM: 9.7 mg/dL (ref 8.4–10.5)
CO2: 22 mEq/L (ref 19–32)
Chloride: 97 mEq/L (ref 96–112)
Creatinine, Ser: 0.8 mg/dL (ref 0.50–1.10)
GFR calc Af Amer: >90 mL/min (ref >90)
GFR calc non Af Amer: 89 mL/min — ABNORMAL LOW (ref >90)
GLUCOSE: 186 mg/dL — AB (ref 70–99)
Potassium: 4.2 mEq/L (ref 3.7–5.3)
Sodium: 135 mEq/L — ABNORMAL LOW (ref 137–147)
TOTAL PROTEIN: 7.5 g/dL (ref 6.0–8.3)
Total Bilirubin: 0.3 mg/dL (ref 0.3–1.2)

## 2014-04-22 LAB — HCG, SERUM, QUALITATIVE: PREG SERUM: NEGATIVE

## 2014-04-22 LAB — CBC
HEMATOCRIT: 32.9 % — AB (ref 36.0–46.0)
Hemoglobin: 10.4 g/dL — ABNORMAL LOW (ref 12.0–15.0)
MCH: 24 pg — AB (ref 26.0–34.0)
MCHC: 31.6 g/dL (ref 30.0–36.0)
MCV: 76 fL — AB (ref 78.0–100.0)
Platelets: 392 10*3/uL (ref 150–400)
RBC: 4.33 MIL/uL (ref 3.87–5.11)
RDW: 15.7 % — ABNORMAL HIGH (ref 11.5–15.5)
WBC: 7.8 10*3/uL (ref 4.0–10.5)

## 2014-04-22 LAB — PROTIME-INR
INR: 1.02 (ref 0.00–1.49)
Prothrombin Time: 13.4 seconds (ref 11.6–15.2)

## 2014-04-22 LAB — APTT: aPTT: 25 seconds (ref 24–37)

## 2014-04-22 NOTE — Pre-Procedure Instructions (Signed)
Julia Estrada  04/22/2014   Your procedure is scheduled on:  Wed, Sept 30 @ 10:15 AM  Report to [facility] Entrance A at 8:15 AM.  Call this number if you have problems the morning of surgery: [phone]   Remember:   Do not eat food or drink liquids after midnight.   Take these medicines the morning of surgery with A SIP OF WATER: Allegra(Fexofenadine),Flonase(Fluticasone),Advair<Bring Your Inhaler With You>,Pantoprazole(Protonix),and Valtrex(Valacyclovir)                Stop taking your Ibuprofen. No Goody's,BC's,Aleve,Aspirin,Fish Oil,or any Herbal Medications   Do not wear jewelry, make-up or nail polish.  Do not wear lotions, powders, or perfumes. You may wear deodorant.  Do not shave 48 hours prior to surgery.   Do not bring valuables to the hospital.  [facility] is not responsible                  for any belongings or valuables.               Contacts, dentures or bridgework may not be worn into surgery.  Leave suitcase in the car. After surgery it may be brought to your room.  For patients admitted to the hospital, discharge time is determined by your                treatment team.               Patients discharged the day of surgery will not be allowed to drive  home.    Special Instructions:  [place] - Preparing for Surgery  Before surgery, you can play an important role.  Because skin is not sterile, your skin needs to be as free of germs as possible.  You can reduce the number of germs on you skin by washing with CHG (chlorahexidine gluconate) soap before surgery.  CHG is an antiseptic cleaner which kills germs and bonds with the skin to continue killing germs even after washing.  Please DO NOT use if you have an allergy to CHG or antibacterial soaps.  If your skin becomes reddened/irritated stop using the CHG and inform your nurse when you arrive at Short Stay.  Do not shave (including legs and underarms) for at least 48 hours prior to the first CHG shower.  You may  shave your face.  Please follow these instructions carefully:   1.  Shower with CHG Soap the night before surgery and the                                morning of Surgery.  2.  If you choose to wash your hair, wash your hair first as usual with your       normal shampoo.  3.  After you shampoo, rinse your hair and body thoroughly to remove the                      Shampoo.  4.  Use CHG as you would any other liquid soap.  You can apply chg directly       to the skin and wash gently with scrungie or a clean washcloth.  5.  Apply the CHG Soap to your body ONLY FROM THE NECK DOWN.        Do not use on open wounds or open sores.  Avoid contact with your eyes,  ears, mouth and genitals (private parts).  Wash genitals (private parts)       with your normal soap.  6.  Wash thoroughly, paying special attention to the area where your surgery        will be performed.  7.  Thoroughly rinse your body with warm water from the neck down.  8.  DO NOT shower/wash with your normal soap after using and rinsing off       the CHG Soap.  9.  Pat yourself dry with a clean towel.            10.  Wear clean pajamas.            11.  Place clean sheets on your bed the night of your first shower and do not        sleep with pets.  Day of Surgery  Do not apply any lotions/deoderants the morning of surgery.  Please wear clean clothes to the hospital/surgery center.     Please read over the following fact sheets that you were given: Pain Booklet, Coughing and Deep Breathing and Surgical Site Infection Prevention

## 2014-04-27 ENCOUNTER — Other Ambulatory Visit: Payer: Self-pay | Admitting: Internal Medicine

## 2014-04-28 ENCOUNTER — Other Ambulatory Visit: Payer: Self-pay | Admitting: Internal Medicine

## 2014-04-29 MED ORDER — DEXTROSE 5 % IV SOLN
3.0000 g | INTRAVENOUS | Status: AC
  Administered 2014-04-30: 3 g via INTRAVENOUS
  Filled 2014-04-29: qty 3000

## 2014-04-29 NOTE — Progress Notes (Signed)
Instructed patient to arrive at 730 am on 04/30/14.

## 2014-04-30 ENCOUNTER — Encounter (HOSPITAL_COMMUNITY): Payer: 59 | Admitting: Anesthesiology

## 2014-04-30 ENCOUNTER — Encounter (HOSPITAL_COMMUNITY)
Admission: RE | Disposition: A | Discharge status: Home / Self Care | Payer: Self-pay | Source: Ambulatory Visit | Attending: Orthopedic Surgery

## 2014-04-30 ENCOUNTER — Encounter (HOSPITAL_COMMUNITY): Payer: Self-pay | Admitting: *Deleted

## 2014-04-30 ENCOUNTER — Ambulatory Visit (HOSPITAL_COMMUNITY): Payer: 59 | Admitting: Anesthesiology

## 2014-04-30 ENCOUNTER — Ambulatory Visit (HOSPITAL_COMMUNITY)
Admission: RE | Admit: 2014-04-30 | Discharge: 2014-04-30 | Disposition: A | Discharge status: Home / Self Care | Payer: 59 | Source: Ambulatory Visit | Attending: Orthopedic Surgery | Admitting: Orthopedic Surgery

## 2014-04-30 DIAGNOSIS — F329 Major depressive disorder, single episode, unspecified: Secondary | ICD-10-CM | POA: Diagnosis not present

## 2014-04-30 DIAGNOSIS — G4730 Sleep apnea, unspecified: Secondary | ICD-10-CM | POA: Insufficient documentation

## 2014-04-30 DIAGNOSIS — S43439A Superior glenoid labrum lesion of unspecified shoulder, initial encounter: Secondary | ICD-10-CM | POA: Insufficient documentation

## 2014-04-30 DIAGNOSIS — X58XXXA Exposure to other specified factors, initial encounter: Secondary | ICD-10-CM | POA: Insufficient documentation

## 2014-04-30 DIAGNOSIS — J45909 Unspecified asthma, uncomplicated: Secondary | ICD-10-CM | POA: Insufficient documentation

## 2014-04-30 DIAGNOSIS — I10 Essential (primary) hypertension: Secondary | ICD-10-CM | POA: Insufficient documentation

## 2014-04-30 DIAGNOSIS — D649 Anemia, unspecified: Secondary | ICD-10-CM | POA: Insufficient documentation

## 2014-04-30 DIAGNOSIS — M25819 Other specified joint disorders, unspecified shoulder: Secondary | ICD-10-CM | POA: Insufficient documentation

## 2014-04-30 DIAGNOSIS — E119 Type 2 diabetes mellitus without complications: Secondary | ICD-10-CM | POA: Diagnosis not present

## 2014-04-30 DIAGNOSIS — Y929 Unspecified place or not applicable: Secondary | ICD-10-CM | POA: Insufficient documentation

## 2014-04-30 DIAGNOSIS — K219 Gastro-esophageal reflux disease without esophagitis: Secondary | ICD-10-CM | POA: Diagnosis not present

## 2014-04-30 DIAGNOSIS — I252 Old myocardial infarction: Secondary | ICD-10-CM | POA: Diagnosis not present

## 2014-04-30 DIAGNOSIS — F3289 Other specified depressive episodes: Secondary | ICD-10-CM | POA: Insufficient documentation

## 2014-04-30 DIAGNOSIS — S43429A Sprain of unspecified rotator cuff capsule, initial encounter: Secondary | ICD-10-CM | POA: Diagnosis not present

## 2014-04-30 DIAGNOSIS — I509 Heart failure, unspecified: Secondary | ICD-10-CM | POA: Insufficient documentation

## 2014-04-30 DIAGNOSIS — M7580 Other shoulder lesions, unspecified shoulder: Secondary | ICD-10-CM

## 2014-04-30 DIAGNOSIS — S43422A Sprain of left rotator cuff capsule, initial encounter: Secondary | ICD-10-CM

## 2014-04-30 HISTORY — PX: SHOULDER ARTHROSCOPY: SHX128

## 2014-04-30 LAB — GLUCOSE, CAPILLARY
GLUCOSE-CAPILLARY: 163 mg/dL — AB (ref 70–99)
Glucose-Capillary: 185 mg/dL — ABNORMAL HIGH (ref 70–99)

## 2014-04-30 LAB — SURGICAL PCR SCREEN
MRSA, PCR: NEGATIVE
Staphylococcus aureus: NEGATIVE

## 2014-04-30 SURGERY — ARTHROSCOPY, SHOULDER
Anesthesia: Regional | Site: Shoulder | Laterality: Left

## 2014-04-30 MED ORDER — PROMETHAZINE HCL 25 MG/ML IJ SOLN
INTRAMUSCULAR | Status: AC
  Filled 2014-04-30: qty 1

## 2014-04-30 MED ORDER — GLYCOPYRROLATE 0.2 MG/ML IJ SOLN
INTRAMUSCULAR | Status: AC
  Filled 2014-04-30: qty 2

## 2014-04-30 MED ORDER — LIDOCAINE HCL (CARDIAC) 20 MG/ML IV SOLN
INTRAVENOUS | Status: DC | PRN
  Administered 2014-04-30: 80 mg via INTRAVENOUS

## 2014-04-30 MED ORDER — SODIUM CHLORIDE 0.9 % IR SOLN
Status: DC | PRN
  Administered 2014-04-30: 1000 mL

## 2014-04-30 MED ORDER — PROPOFOL 10 MG/ML IV BOLUS
INTRAVENOUS | Status: AC
  Filled 2014-04-30: qty 20

## 2014-04-30 MED ORDER — SUCCINYLCHOLINE CHLORIDE 20 MG/ML IJ SOLN
INTRAMUSCULAR | Status: AC
  Filled 2014-04-30: qty 2

## 2014-04-30 MED ORDER — BUPIVACAINE HCL (PF) 0.25 % IJ SOLN
INTRAMUSCULAR | Status: AC
  Filled 2014-04-30: qty 30

## 2014-04-30 MED ORDER — NEOSTIGMINE METHYLSULFATE 10 MG/10ML IV SOLN
INTRAVENOUS | Status: AC
  Filled 2014-04-30: qty 1

## 2014-04-30 MED ORDER — MIDAZOLAM HCL 2 MG/2ML IJ SOLN
INTRAMUSCULAR | Status: AC
Start: 2014-04-30 — End: 2014-04-30
  Filled 2014-04-30: qty 2

## 2014-04-30 MED ORDER — SUCCINYLCHOLINE CHLORIDE 20 MG/ML IJ SOLN
INTRAMUSCULAR | Status: DC | PRN
  Administered 2014-04-30: 80 mg via INTRAVENOUS

## 2014-04-30 MED ORDER — PROMETHAZINE HCL 25 MG/ML IJ SOLN
6.2500 mg | INTRAMUSCULAR | Status: DC | PRN
  Administered 2014-04-30: 6.25 mg via INTRAVENOUS

## 2014-04-30 MED ORDER — ROCURONIUM BROMIDE 50 MG/5ML IV SOLN
INTRAVENOUS | Status: AC
  Filled 2014-04-30: qty 2

## 2014-04-30 MED ORDER — ONDANSETRON HCL 4 MG/2ML IJ SOLN
INTRAMUSCULAR | Status: DC | PRN
  Administered 2014-04-30: 4 mg via INTRAVENOUS

## 2014-04-30 MED ORDER — MIDAZOLAM HCL 5 MG/5ML IJ SOLN
INTRAMUSCULAR | Status: DC | PRN
  Administered 2014-04-30: 2 mg via INTRAVENOUS

## 2014-04-30 MED ORDER — HYDROMORPHONE HCL 1 MG/ML IJ SOLN
INTRAMUSCULAR | Status: AC
  Administered 2014-04-30: 0.5 mg via INTRAVENOUS
  Filled 2014-04-30: qty 2

## 2014-04-30 MED ORDER — FENTANYL CITRATE 0.05 MG/ML IJ SOLN
INTRAMUSCULAR | Status: DC | PRN
  Administered 2014-04-30 (×3): 50 ug via INTRAVENOUS

## 2014-04-30 MED ORDER — STERILE WATER FOR INJECTION IJ SOLN
INTRAMUSCULAR | Status: AC
  Filled 2014-04-30: qty 10

## 2014-04-30 MED ORDER — LACTATED RINGERS IV SOLN
INTRAVENOUS | Status: DC
  Administered 2014-04-30 (×2): via INTRAVENOUS

## 2014-04-30 MED ORDER — HYDROCODONE-ACETAMINOPHEN 5-325 MG PO TABS: 1.0000 | ORAL_TABLET | Freq: Four times a day (QID) | ORAL | Status: DC | PRN

## 2014-04-30 MED ORDER — PROPOFOL 10 MG/ML IV BOLUS
INTRAVENOUS | Status: DC | PRN
  Administered 2014-04-30: 40 mg via INTRAVENOUS
  Administered 2014-04-30: 160 mg via INTRAVENOUS

## 2014-04-30 MED ORDER — HYDROMORPHONE HCL 1 MG/ML IJ SOLN
0.2500 mg | INTRAMUSCULAR | Status: DC | PRN
  Administered 2014-04-30 (×2): 0.5 mg via INTRAVENOUS

## 2014-04-30 MED ORDER — FENTANYL CITRATE 0.05 MG/ML IJ SOLN
INTRAMUSCULAR | Status: AC
  Filled 2014-04-30: qty 5

## 2014-04-30 MED ORDER — ROPIVACAINE HCL 5 MG/ML IJ SOLN
INTRAMUSCULAR | Status: DC | PRN
  Administered 2014-04-30: 20 mL via PERINEURAL

## 2014-04-30 MED ORDER — LIDOCAINE HCL (CARDIAC) 20 MG/ML IV SOLN
INTRAVENOUS | Status: AC
  Filled 2014-04-30: qty 10

## 2014-04-30 MED ORDER — ONDANSETRON HCL 4 MG/2ML IJ SOLN
INTRAMUSCULAR | Status: AC
  Filled 2014-04-30: qty 4

## 2014-04-30 MED ORDER — ROCURONIUM BROMIDE 100 MG/10ML IV SOLN
INTRAVENOUS | Status: DC | PRN
  Administered 2014-04-30: 30 mg via INTRAVENOUS

## 2014-04-30 MED ORDER — MUPIROCIN 2 % EX OINT
TOPICAL_OINTMENT | CUTANEOUS | Status: AC
  Administered 2014-04-30: 1
  Filled 2014-04-30: qty 22

## 2014-04-30 MED ORDER — NEOSTIGMINE METHYLSULFATE 10 MG/10ML IV SOLN
INTRAVENOUS | Status: DC | PRN
  Administered 2014-04-30: 3 mg via INTRAVENOUS

## 2014-04-30 MED ORDER — GLYCOPYRROLATE 0.2 MG/ML IJ SOLN
INTRAMUSCULAR | Status: DC | PRN
  Administered 2014-04-30: 0.4 mg via INTRAVENOUS

## 2014-04-30 SURGICAL SUPPLY — 35 items
BLADE GREAT WHITE 4.2 (BLADE) ×2 IMPLANT
BLADE GREAT WHITE 4.2MM (BLADE) ×1
BUR OVAL 6.0 (BURR) ×3 IMPLANT
CANNULA SHOULDER 7CM (CANNULA) ×3 IMPLANT
COVER SURGICAL LIGHT HANDLE (MISCELLANEOUS) ×3 IMPLANT
DRAPE INCISE IOBAN 66X45 STRL (DRAPES) ×3 IMPLANT
DRAPE STERI 35X30 U-POUCH (DRAPES) ×3 IMPLANT
DRAPE U-SHAPE 47X51 STRL (DRAPES) ×3 IMPLANT
DRSG ADAPTIC 3X8 NADH LF (GAUZE/BANDAGES/DRESSINGS) ×3 IMPLANT
DRSG EMULSION OIL 3X3 NADH (GAUZE/BANDAGES/DRESSINGS) ×3 IMPLANT
DRSG PAD ABDOMINAL 8X10 ST (GAUZE/BANDAGES/DRESSINGS) ×3 IMPLANT
DURAPREP 26ML APPLICATOR (WOUND CARE) ×3 IMPLANT
GAUZE SPONGE 4X4 12PLY STRL (GAUZE/BANDAGES/DRESSINGS) ×3 IMPLANT
GLOVE BIOGEL PI IND STRL 9 (GLOVE) ×1 IMPLANT
GLOVE BIOGEL PI INDICATOR 9 (GLOVE) ×2
GLOVE SURG ORTHO 9.0 STRL STRW (GLOVE) ×3 IMPLANT
GOWN STRL REUS W/ TWL XL LVL3 (GOWN DISPOSABLE) ×2 IMPLANT
GOWN STRL REUS W/TWL XL LVL3 (GOWN DISPOSABLE) ×4
KIT BASIN OR (CUSTOM PROCEDURE TRAY) ×3 IMPLANT
KIT ROOM TURNOVER OR (KITS) ×3 IMPLANT
MANIFOLD NEPTUNE II (INSTRUMENTS) ×3 IMPLANT
NEEDLE SPNL 18GX3.5 QUINCKE PK (NEEDLE) ×3 IMPLANT
NS IRRIG 1000ML POUR BTL (IV SOLUTION) ×3 IMPLANT
PACK SHOULDER (CUSTOM PROCEDURE TRAY) ×3 IMPLANT
PAD ARMBOARD 7.5X6 YLW CONV (MISCELLANEOUS) ×6 IMPLANT
SET ARTHROSCOPY TUBING (MISCELLANEOUS) ×2
SET ARTHROSCOPY TUBING LN (MISCELLANEOUS) ×1 IMPLANT
SLING ARM IMMOBILIZER XL (CAST SUPPLIES) ×3 IMPLANT
SPONGE GAUZE 4X4 12PLY STER LF (GAUZE/BANDAGES/DRESSINGS) ×3 IMPLANT
SPONGE LAP 4X18 X RAY DECT (DISPOSABLE) ×3 IMPLANT
SUT ETHILON 2 0 FS 18 (SUTURE) ×3 IMPLANT
TAPE CLOTH SURG 4X10 WHT LF (GAUZE/BANDAGES/DRESSINGS) ×3 IMPLANT
TOWEL OR 17X24 6PK STRL BLUE (TOWEL DISPOSABLE) ×6 IMPLANT
WAND HAND CNTRL MULTIVAC 90 (MISCELLANEOUS) IMPLANT
WATER STERILE IRR 1000ML POUR (IV SOLUTION) ×3 IMPLANT

## 2014-04-30 NOTE — H&P (Signed)
Julia Estrada is an 43 y.o. female.   Chief Complaint: Impingement with rotator cuff tear left shoulder HPI: Patient is a 43 year old woman who has pain with activities of daily living with her left shoulder. She has failed conservative treatment and presents at this time for arthroscopic intervention.  Past Medical History  Diagnosis Date  . Anemia     iron deficiency  . Obesity     BMI=44  . Vitamin B12 deficiency   . Vitamin D deficiency   . Asthma   . Sleep apnea     on CPAP  . Depression 2011  . Diabetes mellitus 2010    type II  . GERD (gastroesophageal reflux disease) 2004  . Hypertension 2008  . Shortness of breath     Past Surgical History  Procedure Laterality Date  . Fibroidectomy  2002  . Tonsillectomy and adenoidectomy    . Cesarean section    . Myomectomy      Family History  Problem Relation Age of Onset  . Hypertension Mother     father, sisters  . Diabetes Mother     sister  . Kidney cancer Mother   . Cancer Mother   . Cholelithiasis Sister   . Clotting disorder Daughter    Social History:  reports that she has never smoked. She has never used smokeless tobacco. She reports that she does not drink alcohol or use illicit drugs.  Allergies:  Allergies  Allergen Reactions  . Codeine Sulfate Nausea And Vomiting  . Hydrochlorothiazide W-Triamterene     REACTION: cramping  . Oxycodone-Acetaminophen Nausea And Vomiting  . Prednisone Hives  . Victoza [Liraglutide]     n/v    No prescriptions prior to admission    No results found for this or any previous visit (from the past 48 hour(s)). No results found.  Review of Systems  All other systems reviewed and are negative.   There were no vitals taken for this visit. Physical Exam  On examination patient has decreased abduction and flexion of the left shoulder. She has pain with Neer and Hawkins impingement test pain with drop arm test. Assessment/Plan Assessment: Impingement syndrome with  chronic rotator cuff pathology left shoulder.  Plan: We'll plan for left shoulder arthroscopy subacromial decompression debridement rotator cuff tear debridement SLAP lesion. Risks and benefits were discussed including risk of persistent pain need for additional surgery. Patient states she understands and wished to proceed at this time.  [name] V 04/30/2014, 6:39 AM

## 2014-04-30 NOTE — Op Note (Signed)
04/30/2014  12:23 PM  PATIENT:  Shauna Hughanya B Thull    PRE-OPERATIVE DIAGNOSIS:  Impingement Left Shoulder  POST-OPERATIVE DIAGNOSIS:  Same  PROCEDURE:  Left Shoulder Arthroscopy and Debridement Debridement rotator cuff tear debridement of subscapularis tear debridement of SLAP tear and subacromial decompression SURGEON:  DUDA,MARCUS V, MD  PHYSICIAN ASSISTANT:None ANESTHESIA:   General  PREOPERATIVE INDICATIONS:  Shauna Hughanya B Stieber is a  43 y.o. female with a diagnosis of Impingement Left Shoulder who failed conservative measures and elected for surgical management.    The risks benefits and alternatives were discussed with the patient preoperatively including but not limited to the risks of infection, bleeding, nerve injury, cardiopulmonary complications, the need for revision surgery, among others, and the patient was willing to proceed.  OPERATIVE IMPLANTS: None  OPERATIVE FINDINGS: He rotator cuff tear 10 mm in diameter biceps tendon fraying. SLAP tear. Subscapularis tear. Impingement with a type III acromion.  OPERATIVE PROCEDURE: Patient is a 43 year old woman who has failed conservative care for her left shoulder. Patient presents at this time for arthroscopic intervention. Risks and benefits were discussed patient states she understands and wishes to proceed at this time. Description of procedure patient was brought to the operating room and underwent a general anesthetic. After adequate levels of anesthesia were obtained patient's left upper extremity was prepped using DuraPrep draped into a sterile field with the patient in the beachchair position. The scope was inserted from the posterior portal into the shoulder and anterior portals established with outside in technique an 18-gauge spinal. Visualization showed degenerative tearing of the subscapularis a slap lesion as well as a partial tearing of biceps tendon as well as a small full-thickness tear of the rotator cuff. Patient underwent  debridement of his areas with the ArthroCare wand and the shaver. Instruments were removed and the scope was then inserted from the posterior portal the subacromial space and a new lateral portal was established. Visualization showed essentially a bursitis. Patient underwent bursectomy subacromial decompression further debridement rotator cuff.Was obtained. The portals were closed using 3-0 nylon. A sterile compressive dressing was applied. Patient was extubated taken to the PACU in stable condition. Plan for discharge to home.

## 2014-04-30 NOTE — Anesthesia Procedure Notes (Addendum)
Anesthesia Regional Block:  Interscalene brachial plexus block  Pre-Anesthetic Checklist: ,, timeout performed, Correct Patient, Correct Site, Correct Laterality, Correct Procedure, Correct Position, site marked, Risks and benefits discussed,  Surgical consent,  Pre-op evaluation,  At surgeon's request and post-op pain management  Laterality: Upper and Left  Prep: chloraprep       Needles:  Injection technique: Single-shot  Needle Type: Echogenic Stimulator Needle          Additional Needles:  Procedures: ultrasound guided (picture in chart) and nerve stimulator Interscalene brachial plexus block  Nerve Stimulator or Paresthesia:  Response: deltoid, 0.5 mA,   Additional Responses:   Narrative:  Start time: 04/30/2014 9:33 AM End time: 04/30/2014 9:39 AM Injection made incrementally with aspirations every 5 mL.  Performed by: Personally  Anesthesiologist: Maple HudsonMoser  Additional Notes: H+P and labs reviewed, risks and benefits discussed with patient, procedure tolerated well without complications   Procedure Name: Intubation Date/Time: 04/30/2014 10:27 AM Performed by: Arlice ColtMANESS, Kobi Mario B Pre-anesthesia Checklist: Patient identified, Emergency Drugs available, Suction available, Patient being monitored and Timeout performed Patient Re-evaluated:Patient Re-evaluated prior to inductionOxygen Delivery Method: Circle system utilized Preoxygenation: Pre-oxygenation with 100% oxygen Intubation Type: IV induction and Rapid sequence Laryngoscope Size: Mac and 3 Grade View: Grade I Tube type: Oral Tube size: 7.5 mm Number of attempts: 2 (atttempt times one by paramedic student who was unable to view cords, grade one view by Dr. Maple HudsonMoser) Airway Equipment and Method: Stylet Placement Confirmation: ETT inserted through vocal cords under direct vision,  positive ETCO2 and breath sounds checked- equal and bilateral Secured at: 22 cm Tube secured with: Tape Dental Injury: Bloody posterior  oropharynx

## 2014-04-30 NOTE — Anesthesia Preprocedure Evaluation (Signed)
Anesthesia Evaluation  Patient identified by MRN, date of birth, ID band Patient awake    Reviewed: Allergy & Precautions, H&P , NPO status , Patient's Chart, lab work & pertinent test results  Airway Mallampati: II TM Distance: >3 FB Neck ROM: Full    Dental  (+) Teeth Intact   Pulmonary shortness of breath and with exertion, asthma , sleep apnea and Continuous Positive Airway Pressure Ventilation ,  breath sounds clear to auscultation        Cardiovascular hypertension, Pt. on medications - angina- Past MI and - CHF Rhythm:Regular     Neuro/Psych PSYCHIATRIC DISORDERS Depression negative neurological ROS     GI/Hepatic GERD-  Medicated and Controlled,  Endo/Other  diabetes, Type 2, Oral Hypoglycemic Agents  Renal/GU      Musculoskeletal   Abdominal   Peds  Hematology  (+) Blood dyscrasia, anemia ,   Anesthesia Other Findings   Reproductive/Obstetrics                           Anesthesia Physical Anesthesia Plan  ASA: III  Anesthesia Plan: General and Regional   Post-op Pain Management:    Induction:   Airway Management Planned: Oral ETT  Additional Equipment: None  Intra-op Plan:   Post-operative Plan: Extubation in OR  Informed Consent: I have reviewed the patients History and Physical, chart, labs and discussed the procedure including the risks, benefits and alternatives for the proposed anesthesia with the patient or authorized representative who has indicated his/her understanding and acceptance.   Dental advisory given  Plan Discussed with: CRNA and Surgeon  Anesthesia Plan Comments:         Anesthesia Quick Evaluation

## 2014-04-30 NOTE — Transfer of Care (Signed)
Immediate Anesthesia Transfer of Care Note  Patient: Julia Estrada  Procedure(s) Performed: Procedure(s): Left Shoulder Arthroscopy and Debridement (Left)  Patient Location: PACU  Anesthesia Type:General and Regional  Level of Consciousness: awake, alert  and oriented  Airway & Oxygen Therapy: Patient Spontanous Breathing and Patient connected to nasal cannula oxygen  Post-op Assessment: Report given to PACU RN and Post -op Vital signs reviewed and stable  Post vital signs: Reviewed and stable  Complications: No apparent anesthesia complications

## 2014-04-30 NOTE — Discharge Instructions (Signed)
Discontinue sling in the morning. Start range of motion in the morning. Discontinue dressing in 2 days. May shower and get incision wet after dressing removed.

## 2014-04-30 NOTE — Anesthesia Postprocedure Evaluation (Signed)
  Anesthesia Post-op Note  Patient: Julia Estrada  Procedure(s) Performed: Procedure(s): Left Shoulder Arthroscopy and Debridement (Left)  Patient Location: PACU  Anesthesia Type:General and Regional  Level of Consciousness: awake and alert   Airway and Oxygen Therapy: Patient Spontanous Breathing  Post-op Pain: mild  Post-op Assessment: Post-op Vital signs reviewed, Patient's Cardiovascular Status Stable, Respiratory Function Stable, Patent Airway, No signs of Nausea or vomiting and Pain level controlled  Post-op Vital Signs: Reviewed and stable  Last Vitals:  Filed Vitals:   04/30/14 1230  BP:   Pulse: 95  Temp:   Resp: 27    Complications: No apparent anesthesia complications

## 2014-04-30 NOTE — Progress Notes (Signed)
Pt became very sleepy after nausea medication. Did not feel [facility] could [facility] until now. Weaning off oxygen and working with deep breathing exercises.

## 2014-05-01 ENCOUNTER — Encounter (HOSPITAL_COMMUNITY): Payer: Self-pay | Admitting: Orthopedic Surgery

## 2014-05-09 ENCOUNTER — Telehealth: Payer: Self-pay | Admitting: Pulmonary Disease

## 2014-05-09 DIAGNOSIS — G4733 Obstructive sleep apnea (adult) (pediatric): Secondary | ICD-10-CM

## 2014-05-09 DIAGNOSIS — Z9989 Dependence on other enabling machines and devices: Principal | ICD-10-CM

## 2014-05-09 NOTE — Telephone Encounter (Signed)
Pt has S8 Escape.  Download reports 100% compliance with 7 hrs 28 min/night on average.  No information on AHI or pressure setting.  Will have my nurse inform pt that she will need to have auto CPAP titration arranged for 2 weeks to determine optimal pressure setting.  Will then determine if she needs new mask and machine long term.

## 2014-05-12 ENCOUNTER — Telehealth: Payer: Self-pay | Admitting: Pulmonary Disease

## 2014-05-12 DIAGNOSIS — G4733 Obstructive sleep apnea (adult) (pediatric): Secondary | ICD-10-CM

## 2014-05-12 DIAGNOSIS — Z9989 Dependence on other enabling machines and devices: Principal | ICD-10-CM

## 2014-05-12 NOTE — Telephone Encounter (Signed)
Yes.  Please send order for auto titration with pressure range 5 to 20 cm H2O with heated humidity.

## 2014-05-12 NOTE — Telephone Encounter (Signed)
Per 05/09/14 order: Note Please have her DME arrange for auto CPAP titration for 2 weeks, and have download sent after two weeks. She currently has S8 Escape device   Per [facility] the order needs the auto settings on order. Does pt need to be placed on auto 5-20 cm? Please advise thanks

## 2014-05-12 NOTE — Telephone Encounter (Signed)
Order placed with settings and [facility]

## 2014-05-14 NOTE — Telephone Encounter (Signed)
LMTCB x 1 

## 2014-05-15 NOTE — Telephone Encounter (Signed)
lmtcb x1 

## 2014-05-15 NOTE — Telephone Encounter (Signed)
Pt returning call to nurse- [phone]

## 2014-05-15 NOTE — Telephone Encounter (Signed)
Pt returned call & can be reached at [phone].  If pt is unable to answer the phone, ok to leave a detailed message on her voice mail.  Thanks!  Julia Estrada

## 2014-05-16 ENCOUNTER — Ambulatory Visit: Payer: 59 | Attending: Orthopedic Surgery

## 2014-05-16 DIAGNOSIS — M25619 Stiffness of unspecified shoulder, not elsewhere classified: Secondary | ICD-10-CM | POA: Insufficient documentation

## 2014-05-16 DIAGNOSIS — M25519 Pain in unspecified shoulder: Secondary | ICD-10-CM | POA: Insufficient documentation

## 2014-05-16 DIAGNOSIS — Z5189 Encounter for other specified aftercare: Secondary | ICD-10-CM | POA: Insufficient documentation

## 2014-05-16 DIAGNOSIS — R293 Abnormal posture: Secondary | ICD-10-CM | POA: Insufficient documentation

## 2014-05-22 ENCOUNTER — Ambulatory Visit (INDEPENDENT_AMBULATORY_CARE_PROVIDER_SITE_OTHER): Payer: 59 | Admitting: Pulmonary Disease

## 2014-05-22 ENCOUNTER — Encounter: Payer: Self-pay | Admitting: Pulmonary Disease

## 2014-05-22 VITALS — BP 150/88 | HR 88 | Ht 66.0 in | Wt 303.4 lb

## 2014-05-22 DIAGNOSIS — R0600 Dyspnea, unspecified: Secondary | ICD-10-CM

## 2014-05-22 DIAGNOSIS — Z9989 Dependence on other enabling machines and devices: Secondary | ICD-10-CM

## 2014-05-22 DIAGNOSIS — J45998 Other asthma: Secondary | ICD-10-CM

## 2014-05-22 DIAGNOSIS — G4733 Obstructive sleep apnea (adult) (pediatric): Secondary | ICD-10-CM

## 2014-05-22 DIAGNOSIS — IMO0002 Reserved for concepts with insufficient information to code with codable children: Secondary | ICD-10-CM

## 2014-05-22 DIAGNOSIS — J3089 Other allergic rhinitis: Secondary | ICD-10-CM

## 2014-05-22 DIAGNOSIS — R0609 Other forms of dyspnea: Secondary | ICD-10-CM

## 2014-05-22 LAB — PULMONARY FUNCTION TEST
DL/VA % PRED: 119 %
DL/VA: 6.03 ml/min/mmHg/L
DLCO unc % pred: 73 %
DLCO unc: 19.86 ml/min/mmHg
FEF 25-75 PRE: 2.28 L/s
FEF 25-75 Post: 2.79 L/sec
FEF2575-%Change-Post: 22 %
FEF2575-%PRED-PRE: 79 %
FEF2575-%Pred-Post: 97 %
FEV1-%Change-Post: 4 %
FEV1-%PRED-PRE: 74 %
FEV1-%Pred-Post: 77 %
FEV1-Post: 2.06 L
FEV1-Pre: 1.98 L
FEV1FVC-%Change-Post: 1 %
FEV1FVC-%PRED-PRE: 100 %
FEV6-%CHANGE-POST: 2 %
FEV6-%Pred-Post: 75 %
FEV6-%Pred-Pre: 73 %
FEV6-POST: 2.42 L
FEV6-Pre: 2.36 L
FEV6FVC-%CHANGE-POST: 0 %
FEV6FVC-%Pred-Post: 102 %
FEV6FVC-%Pred-Pre: 101 %
FVC-%CHANGE-POST: 2 %
FVC-%PRED-PRE: 72 %
FVC-%Pred-Post: 74 %
FVC-POST: 2.42 L
FVC-Pre: 2.36 L
POST FEV1/FVC RATIO: 85 %
PRE FEV1/FVC RATIO: 84 %
Post FEV6/FVC ratio: 100 %
Pre FEV6/FVC Ratio: 100 %
RV % pred: 67 %
RV: 1.19 L
TLC % pred: 70 %
TLC: 3.75 L

## 2014-05-22 NOTE — Patient Instructions (Signed)
Will arrange for Echo and exercise stress test with cardiology Follow up in 3 months

## 2014-05-22 NOTE — Assessment & Plan Note (Signed)
Improved since last visit

## 2014-05-22 NOTE — Progress Notes (Signed)
Chief Complaint  Patient presents with  . Follow-up    Review PFT. No complaints.     History of Present Illness: Julia Estrada is a 43 y.o. female with OSA, dyspnea, asthma, and rhinitis.  She is here to review her PFT.  This showed mild restriction and diffusion defect >> likely related to her weight.  Her breathing is better since resuming advair.  She still gets winded with activity and her daughter tells her that she looks like she is short of breath.  She denies chest pain or palpitations.  She reports that several family members have heart disease.  TESTS: PSG March 2002 >> AHI 77, SpO2 low 86% PFT 05/22/14 >> FEV1 2.06 (77%), FEV1% 85%, TLC 3.75 (70%), DLCO 73%, no BD  Past medical hx, Past surgical hx, Medications, Allergies, Family hx, Social hx all reviewed.   Physical Exam:  General - No distress ENT - No sinus tenderness, no oral exudate, no LAN, MP 3 Cardiac - s1s2 regular, no murmur Chest - No wheeze/rales/dullness Back - No focal tenderness Abd - Soft, non-tender Ext - No edema Neuro - Normal strength Skin - No rashes Psych - normal mood, and behavior   Assessment/Plan:  Coralyn HellingVineet Deen Deguia, MD [place] Pager:  [phone]

## 2014-05-22 NOTE — Assessment & Plan Note (Signed)
She reports compliance and improvement with CPAP.  Her current CPAP does not provide information about control of her sleep apnea.  Have arranged for auto titration at home, and will call her with results.

## 2014-05-22 NOTE — Progress Notes (Signed)
PFT done today. 

## 2014-05-22 NOTE — Assessment & Plan Note (Signed)
Improved, but persistent.  To further assess will arrange for exercise tolerance test and Echo with cardiology >> will call her with results.  She likely has deconditioning >> if her cardiac evaluation is unrevealing, then she will need to start gradual exercise/weight loss regimen.

## 2014-05-22 NOTE — Telephone Encounter (Signed)
Pt aware that order for CPAP titration has been placed. Placed on 05/12/14. Pt expressed understanding.  Nothing further needed.

## 2014-05-26 ENCOUNTER — Ambulatory Visit: Payer: 59

## 2014-05-26 DIAGNOSIS — Z5189 Encounter for other specified aftercare: Secondary | ICD-10-CM | POA: Diagnosis not present

## 2014-05-27 ENCOUNTER — Other Ambulatory Visit (HOSPITAL_COMMUNITY): Payer: 59

## 2014-05-27 ENCOUNTER — Ambulatory Visit (HOSPITAL_COMMUNITY): Payer: 59 | Attending: Internal Medicine | Admitting: Radiology

## 2014-05-27 DIAGNOSIS — R0602 Shortness of breath: Secondary | ICD-10-CM | POA: Insufficient documentation

## 2014-05-27 DIAGNOSIS — E669 Obesity, unspecified: Secondary | ICD-10-CM | POA: Diagnosis not present

## 2014-05-27 DIAGNOSIS — E119 Type 2 diabetes mellitus without complications: Secondary | ICD-10-CM | POA: Insufficient documentation

## 2014-05-27 DIAGNOSIS — R0609 Other forms of dyspnea: Secondary | ICD-10-CM

## 2014-05-27 NOTE — Progress Notes (Signed)
Echocardiogram performed.  

## 2014-05-28 ENCOUNTER — Ambulatory Visit: Payer: 59 | Admitting: Physical Therapy

## 2014-05-28 DIAGNOSIS — Z5189 Encounter for other specified aftercare: Secondary | ICD-10-CM | POA: Diagnosis not present

## 2014-06-03 ENCOUNTER — Telehealth: Payer: Self-pay | Admitting: Pulmonary Disease

## 2014-06-03 ENCOUNTER — Other Ambulatory Visit (INDEPENDENT_AMBULATORY_CARE_PROVIDER_SITE_OTHER): Payer: 59

## 2014-06-03 DIAGNOSIS — E119 Type 2 diabetes mellitus without complications: Secondary | ICD-10-CM

## 2014-06-03 DIAGNOSIS — G4733 Obstructive sleep apnea (adult) (pediatric): Secondary | ICD-10-CM

## 2014-06-03 DIAGNOSIS — Z9989 Dependence on other enabling machines and devices: Principal | ICD-10-CM

## 2014-06-03 LAB — BASIC METABOLIC PANEL
BUN: 14 mg/dL (ref 6–23)
CO2: 27 mEq/L (ref 19–32)
Calcium: 9.4 mg/dL (ref 8.4–10.5)
Chloride: 101 mEq/L (ref 96–112)
Creatinine, Ser: 0.7 mg/dL (ref 0.4–1.2)
GFR: 117.37 mL/min (ref >60.00)
GLUCOSE: 127 mg/dL — AB (ref 70–99)
Potassium: 4.2 mEq/L (ref 3.5–5.1)
SODIUM: 135 meq/L (ref 135–145)

## 2014-06-03 LAB — HEMOGLOBIN A1C: Hgb A1c MFr Bld: 9.2 % — ABNORMAL HIGH (ref 4.6–6.5)

## 2014-06-03 NOTE — Telephone Encounter (Signed)
Echo 05/27/14 >> mild LVH, EF 55 to 60%, grade 1 diastolic dysfx, mild increase in PA pressure, mild LA dilation   Will have my nurse inform pt that Echo shows very mild changes from having high blood pressure and sleep apnea.  She needs to continue with current therapies for high blood pressure and sleep apnea.

## 2014-06-04 ENCOUNTER — Ambulatory Visit: Payer: 59 | Attending: Orthopedic Surgery

## 2014-06-04 DIAGNOSIS — M25619 Stiffness of unspecified shoulder, not elsewhere classified: Secondary | ICD-10-CM | POA: Insufficient documentation

## 2014-06-04 DIAGNOSIS — M25612 Stiffness of left shoulder, not elsewhere classified: Secondary | ICD-10-CM

## 2014-06-04 DIAGNOSIS — R531 Weakness: Secondary | ICD-10-CM

## 2014-06-04 DIAGNOSIS — Z5189 Encounter for other specified aftercare: Secondary | ICD-10-CM | POA: Insufficient documentation

## 2014-06-04 DIAGNOSIS — M25519 Pain in unspecified shoulder: Secondary | ICD-10-CM | POA: Diagnosis not present

## 2014-06-04 DIAGNOSIS — R293 Abnormal posture: Secondary | ICD-10-CM | POA: Insufficient documentation

## 2014-06-04 DIAGNOSIS — M25512 Pain in left shoulder: Secondary | ICD-10-CM

## 2014-06-04 NOTE — Therapy (Signed)
Physical Therapy Treatment  Patient Details  Name: Julia Estrada MRN: [id number] Date of Birth: 1970/12/30  Encounter Date: 06/04/2014      PT End of Session - 06/04/14 0744    Visit Number 4   Number of Visits 12   PT Start Time 0703   PT Stop Time 0742   PT Time Calculation (min) 39 min   Activity Tolerance --  Tolerated well with minor soreness and was issued ice pack on leaving for work      Past Medical History  Diagnosis Date  . Anemia     iron deficiency  . Obesity     BMI=44  . Vitamin B12 deficiency   . Vitamin D deficiency   . Asthma   . Sleep apnea     on CPAP  . Depression 2011  . Diabetes mellitus 2010    type II  . GERD (gastroesophageal reflux disease) 2004  . Hypertension 2008  . Shortness of breath     Past Surgical History  Procedure Laterality Date  . Fibroidectomy  2002  . Tonsillectomy and adenoidectomy    . Cesarean section    . Myomectomy    . Shoulder arthroscopy Left 04/30/2014    Procedure: Left Shoulder Arthroscopy and Debridement;  Surgeon: Julia Minion, MD;  Location: [facility];  Service: Orthopedics;  Laterality: Left;    There were no vitals taken for this visit.  Visit Diagnosis:  No diagnosis found.        [facility] PT Assessment - 06/04/14 0700    AROM   Right Shoulder Flexion 160 Degrees   Right Shoulder ABduction 164 Degrees   Right Shoulder Internal Rotation 30 Degrees   Right Shoulder External Rotation 90 Degrees   Left Shoulder Flexion 146 Degrees   Left Shoulder ABduction 175 Degrees   Left Shoulder Internal Rotation 35 Degrees   Left Shoulder External Rotation 115 Degrees          Adult PT Treatment/Exercise - 06/04/14 0700    Shoulder Exercises: Supine   Protraction --  5 pounds x5, 3 pounds x10   Shoulder Exercises: Sidelying   External Rotation --  2 sets x10   Shoulder Exercises: Standing   Protraction Weight (lbs) --   Row Weight (lbs) --  x15 5 pounds   Retraction Weight (lbs) x12 7 pounds   Shoulder Exercises: Isometric Strengthening   Flexion --  10x 5 sec   Extension --  10x 5 sec   External Rotation --  10x5 sec.   ABduction --  10x5 sec.   Shoulder Exercises: Stretch   Corner Stretch 2 reps;20 seconds   Wall Stretch - Flexion 2 reps;20 seconds   Other Shoulder Stretches pulley over head flexion and abduciton 20 reps eacch          Education - 06/04/14 0743    Education provided Yes   Education Details cues given during exercise to improve technique.    Methods Tactile cues;Verbal cues   Comprehension Returned demonstration          PT Short Term Goals - 06/04/14 0749    PT SHORT TERM GOAL #1   Title Independent with initial HEP   Time 2   Period Weeks   Status On-going   PT SHORT TERM GOAL #2   Title Pain decreased 25% with self care   Time 3   Period Weeks   Status Achieved   PT SHORT TERM GOAL #3   Title Active  LT shoulder flexion 130 degrees or more    Status Achieved   PT SHORT TERM GOAL #4   Title active Lt shoulder abduction 125 degrees   Status Achieved          PT Long Term Goals - 06/04/14 0751    PT LONG TERM GOAL #1   Title Understanding of use of cold/RICE   Time 4   Period Weeks   Status Achieved   PT LONG TERM GOAL #2   Title Independent with advance HEP   Time 4   Period Weeks   Status On-going   PT LONG TERM GOAL #3   Title Report decreased pain by 75% with normal home tasks of sweeping , mopping, vacuuming   Time 4   Period Weeks   PT LONG TERM GOAL #4   Status On-going   PT LONG TERM GOAL #5   Title Be able to put 3 pounds on shelf at head level or higher for access into upper cabinets   Time 4   Period Weeks   Status Not Met   PT LONG TERM GOAL #6   Title Return to full duty work   Time 4   Status On-going          Plan - 06/04/14 0747    Clinical Impression Statement Julia Estrada is still limited with range and stength affecting normal home tasks.  She is progressing toward goals   Pt will benefit from  skilled therapeutic intervention in order to improve on the following deficits Pain;Decreased range of motion;Decreased strength   Rehab Potential Good   PT Frequency Min 2X/week   PT Duration 4 weeks   PT Treatment/Interventions Patient/family education;Modalities;Manual techniques;Therapeutic exercise   PT Plan Continue with range and progress strengthening as tolerated, Modalities PRN        Problem List Patient Active Problem List   Diagnosis Date Noted  . Allergic rhinitis 04/12/2014  . Persistent asthma 04/12/2014  . Dyspnea 04/12/2014  . Left shoulder pain 02/07/2014  . 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 Intertrigo 03/30/2012  . OSA on CPAP 03/30/2012  . GERD (gastroesophageal reflux disease) 04/12/2011  . ABDOMINAL PAIN, EPIGASTRIC 05/04/2010  . UNSPECIFIED EUSTACHIAN TUBE DISORDER 02/23/2010  . OTITIS MEDIA, LEFT 02/23/2010  . INTERMITTENT VERTIGO 02/23/2010  . NAUSEA 01/18/2010  . LUQ PAIN 01/18/2010  . 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 CHEST PAIN 10/05/2009  . DEPRESSION 09/07/2009  . OBESITY 07/17/2009  . DIABETES MELLITUS, TYPE II 04/14/2009  . 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 ELEVATED BP 10/02/2008  . B12 DEFICIENCY 08/30/2007  . VITAMIN D DEFICIENCY 08/30/2007  . HAIR LOSS 08/30/2007  . PARESTHESIA 08/30/2007  . WEIGHT GAIN 08/30/2007  . ANEMIA-IRON DEFICIENCY 05/28/2007                                            Darrel Hoover 06/04/2014, 8:00 AM

## 2014-06-04 NOTE — Patient Instructions (Signed)
Ice pack issued for patient to take with her for 15 minutes at work.

## 2014-06-05 NOTE — Telephone Encounter (Signed)
Auto CPAP 5 to 20 cm H2O.

## 2014-06-05 NOTE — Telephone Encounter (Signed)
Dr Craige CottaSood, Please advise on range for auto titration. Per your last ov note:    Expand All Collapse All   She reports compliance and improvement with CPAP. Her current CPAP does not provide information about control of her sleep apnea. Have arranged for auto titration at home, and will call her with results

## 2014-06-05 NOTE — Addendum Note (Signed)
Addended by: Abigail MiyamotoPHELPS, Erasmus Bistline D on: 06/05/2014 12:17 PM   Modules accepted: Orders

## 2014-06-05 NOTE — Telephone Encounter (Signed)
PT informed of echo results per Dr Craige CottaSood.  Pt states she was supposed to have something done with cpap per Dr Craige CottaSood and she hasnt heard anything.  Per last ov 05/22/14 Dr Craige CottaSood wanted and auto titration.  Order placed.  In[facility]ed pt [facility]  Pt verbalized understanding.  Nothing further needed.

## 2014-06-05 NOTE — Telephone Encounter (Signed)
Dr Craige CottaSood , Please advise on range for auto titration.   Per you last ov note:

## 2014-06-06 ENCOUNTER — Encounter: Payer: Self-pay | Admitting: Internal Medicine

## 2014-06-06 ENCOUNTER — Ambulatory Visit: Payer: 59 | Admitting: Physical Therapy

## 2014-06-06 ENCOUNTER — Ambulatory Visit (INDEPENDENT_AMBULATORY_CARE_PROVIDER_SITE_OTHER): Payer: 59 | Admitting: Internal Medicine

## 2014-06-06 VITALS — BP 128/80 | HR 95 | Temp 98.6°F | Wt 296.0 lb

## 2014-06-06 DIAGNOSIS — R635 Abnormal weight gain: Secondary | ICD-10-CM

## 2014-06-06 DIAGNOSIS — Z0000 Encounter for general adult medical examination without abnormal findings: Secondary | ICD-10-CM

## 2014-06-06 DIAGNOSIS — K219 Gastro-esophageal reflux disease without esophagitis: Secondary | ICD-10-CM

## 2014-06-06 DIAGNOSIS — E669 Obesity, unspecified: Secondary | ICD-10-CM

## 2014-06-06 DIAGNOSIS — IMO0002 Reserved for concepts with insufficient information to code with codable children: Secondary | ICD-10-CM

## 2014-06-06 DIAGNOSIS — E538 Deficiency of other specified B group vitamins: Secondary | ICD-10-CM

## 2014-06-06 DIAGNOSIS — M25612 Stiffness of left shoulder, not elsewhere classified: Secondary | ICD-10-CM

## 2014-06-06 DIAGNOSIS — M25512 Pain in left shoulder: Secondary | ICD-10-CM

## 2014-06-06 DIAGNOSIS — E1165 Type 2 diabetes mellitus with hyperglycemia: Secondary | ICD-10-CM

## 2014-06-06 DIAGNOSIS — R531 Weakness: Secondary | ICD-10-CM

## 2014-06-06 DIAGNOSIS — Z5189 Encounter for other specified aftercare: Secondary | ICD-10-CM | POA: Diagnosis not present

## 2014-06-06 NOTE — Assessment & Plan Note (Signed)
Low carb diet 

## 2014-06-06 NOTE — Assessment & Plan Note (Signed)
Continue with current prescription therapy as reflected on the Med list.  

## 2014-06-06 NOTE — Therapy (Signed)
Physical Therapy Treatment  Patient Details  Name: Julia Estrada MRN: [id number] Date of Birth: 06/30/1971  Encounter Date: 06/06/2014      PT End of Session - 06/06/14 0754    Visit Number 5   Number of Visits 12   Date for PT Re-Evaluation 07/12/14   PT Start Time 0700   PT Stop Time 0750   PT Time Calculation (min) 50 min   Activity Tolerance Patient tolerated treatment well      Past Medical History  Diagnosis Date  . Anemia     iron deficiency  . Obesity     BMI=44  . Vitamin B12 deficiency   . Vitamin D deficiency   . Asthma   . Sleep apnea     on CPAP  . Depression 2011  . Diabetes mellitus 2010    type II  . GERD (gastroesophageal reflux disease) 2004  . Hypertension 2008  . Shortness of breath     Past Surgical History  Procedure Laterality Date  . Fibroidectomy  2002  . Tonsillectomy and adenoidectomy    . Cesarean section    . Myomectomy    . Shoulder arthroscopy Left 04/30/2014    Procedure: Left Shoulder Arthroscopy and Debridement;  Surgeon: Nadara MustardMarcus Duda V, MD;  [facility];  Service: Orthopedics;  Laterality: Left;    There were no vitals taken for this visit.  Visit Diagnosis:  No diagnosis found.          [facility] Adult PT Treatment/Exercise - 06/06/14 0707    Exercises   Exercises Shoulder   Shoulder Exercises: Supine   Protraction Left;20 reps;Weights  5#   External Rotation Weight (lbs) --  2# 20 reps   Flexion --  20 sec holds 3x   Theraband Level (Shoulder Flexion) Level 2 (Red)  press 20 reps   Shoulder Exercises: Prone   Extension Weight (lbs) --  3# 20 reps   Horizontal ABduction 1 Weight (lbs) --  2# 20 reps   Other Prone Exercises --  Bent row 5# 20x   Shoulder Exercises: Standing   Theraband Level (Shoulder External Rotation) Level 2 (Red)  20 reps   Theraband Level (Shoulder Internal Rotation) Level 2 (Red)  20x   Theraband Level (Shoulder Flexion) Level 2 (Red)  20 reps   Theraband Level (Shoulder Extension)  Level 2 (Red)  20 reps   Other Standing Exercises --  triceps extension red band   Modalities   Modalities Cryotherapy  Left shoulder 10 min   Manual Therapy   Manual Therapy Joint mobilization  GH distraction, inferior and posterior grade 3 with movement          Education - 06/06/14 0752    Education provided Yes   Education Details [place] modified red band   Education Details Patient   Methods Explanation;Demonstration;Tactile cues;Verbal cues;Handout   Comprehension Verbalized understanding;Returned demonstration;Verbal cues required              Plan - 06/06/14 0914    Clinical Impression Statement Patient progessing well with left shoulder A/ROM and low level strengthening s/p rotator cuff debridement but would benefit from additional PT to achieve endrange movements and strength for household chores like sweeping and mopping.   Verbal and tactile cues to encourage scapula retraction and depression and to decrease compensatory shoulder shrug.      PT Plan Continue with progressive left shoulder ROM toward endrange and moderate level strengthening.  Problem List Patient Active Problem List   Diagnosis Date Noted  . Allergic rhinitis 04/12/2014  . Persistent asthma 04/12/2014  . Dyspnea 04/12/2014  . Left shoulder pain 02/07/2014  . 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 Intertrigo 03/30/2012  . OSA on CPAP 03/30/2012  . GERD (gastroesophageal reflux disease) 04/12/2011  . ABDOMINAL PAIN, EPIGASTRIC 05/04/2010  . UNSPECIFIED EUSTACHIAN TUBE DISORDER 02/23/2010  . OTITIS MEDIA, LEFT 02/23/2010  . INTERMITTENT VERTIGO 02/23/2010  . NAUSEA 01/18/2010  . LUQ PAIN 01/18/2010  . 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 CHEST PAIN 10/05/2009  . DEPRESSION 09/07/2009  . OBESITY 07/17/2009  . DIABETES MELLITUS, TYPE II 04/14/2009  . 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 ELEVATED BP 10/02/2008  . B12 DEFICIENCY 08/30/2007  . VITAMIN D DEFICIENCY 08/30/2007  . HAIR LOSS  08/30/2007  . PARESTHESIA 08/30/2007  . WEIGHT GAIN 08/30/2007  . ANEMIA-IRON DEFICIENCY 05/28/2007                                            Lavinia SharpsSimpson, Stacy C 06/06/2014, 9:24 AM

## 2014-06-06 NOTE — Assessment & Plan Note (Signed)
Worse Dr Lucianne MussKumar Low carb diet Continue with current prescription therapy as reflected on the Med list.

## 2014-06-06 NOTE — Progress Notes (Signed)
Pre visit review using our clinic review tool, if applicable. No additional management support is needed unless otherwise documented below in the visit note. 

## 2014-06-06 NOTE — Assessment & Plan Note (Signed)
Morbid 9/14 Lap band discussed
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
## 2014-06-06 NOTE — Patient Instructions (Signed)
Strengthening: Resisted Internal Rotation   Hold tubing in left hand, elbow at side and forearm out. Rotate forearm in across body. Repeat ____ times per set. Do ____ sets per session. Do ____ sessions per day.  http://orth.exer.us/830   Copyright  VHI. All rights reserved.  Strengthening: Resisted External Rotation   Hold tubing in right hand, elbow at side and forearm across body. Rotate forearm out. Repeat ____ times per set. Do ____ sets per session. Do ____ sessions per day.  http://orth.exer.us/828   Copyright  VHI. All rights reserved.  Strengthening: Resisted Flexion   Hold tubing with left arm at side. Pull forward and up. Move shoulder through pain-free range of motion. Repeat ____ times per set. Do ____ sets per session. Do ____ sessions per day.  http://orth.exer.us/824   Copyright  VHI. All rights reserved.  Strengthening: Resisted Extension   Hold tubing in right hand, arm forward. Pull arm back, elbow straight. Repeat ____ times per set. Do ____ sets per session. Do ____ sessions per day.  http://orth.exer.us/832   Copyright  VHI. All rights reserved.   

## 2014-06-10 ENCOUNTER — Ambulatory Visit: Payer: 59

## 2014-06-10 DIAGNOSIS — M25512 Pain in left shoulder: Secondary | ICD-10-CM

## 2014-06-10 DIAGNOSIS — R531 Weakness: Secondary | ICD-10-CM

## 2014-06-10 DIAGNOSIS — Z5189 Encounter for other specified aftercare: Secondary | ICD-10-CM | POA: Diagnosis not present

## 2014-06-10 DIAGNOSIS — M25612 Stiffness of left shoulder, not elsewhere classified: Secondary | ICD-10-CM

## 2014-06-10 NOTE — Patient Instructions (Signed)
Asked pt to not exercise again today as she was not able to do as many reps due to soreness. Suggested cold as needed today for soreness. No new HEP added

## 2014-06-10 NOTE — Therapy (Signed)
Physical Therapy Treatment  Patient Details  Name: Julia Estrada MRN: [id number] Date of Birth: 11/26/1970  Encounter Date: 06/10/2014      PT End of Session - 06/10/14 0833    Visit Number 6   Number of Visits 12   PT Start Time 0800   PT Stop Time 0845   PT Time Calculation (min) 45 min   Activity Tolerance Patient limited by pain  ,primarily soreness   Behavior [facility] tasks assessed/performed      Past Medical History  Diagnosis Date  . Anemia     iron deficiency  . Obesity     BMI=44  . Vitamin B12 deficiency   . Vitamin D deficiency   . Asthma   . Sleep apnea     on CPAP  . Depression 2011  . Diabetes mellitus 2010    type II  . GERD (gastroesophageal reflux disease) 2004  . Hypertension 2008  . Shortness of breath     Past Surgical History  Procedure Laterality Date  . Fibroidectomy  2002  . Tonsillectomy and adenoidectomy    . Cesarean section    . Myomectomy    . Shoulder arthroscopy Left 04/30/2014    Procedure: Left Shoulder Arthroscopy and Debridement;  Surgeon: Nadara MustardMarcus Duda V, MD;  [facility];  Service: Orthopedics;  Laterality: Left;    There were no vitals taken for this visit.  Visit Diagnosis:  Pain in joint, shoulder region, left  Weakness  Stiffness of joint, shoulder region, left      Subjective Assessment - 06/10/14 0808    Symptoms Slept last night without waking. Its better   Currently in Pain? No/denies   Multiple Pain Sites No            [facility] Adult PT Treatment/Exercise - 06/10/14 0812    Shoulder Exercises: Supine   Protraction Strengthening;15 reps  5 pounds    External Rotation Strengthening;Left;15 reps  2 pounds   Flexion Strengthening;Left;15 reps  2 pounds   Shoulder Exercises: Seated   Theraband Level (Shoulder Horizontal ABduction) Level 2 (Red)  x10 reps   Theraband Level (Shoulder External Rotation) Level 2 (Red)  x20 reps   Other Seated Exercises --  overhead press 3 lbs. 20 reps    Other Seated Exercises bicep curls 5 lbs x20   Shoulder Exercises: Standing   Theraband Level (Shoulder Extension) Level 2 (Red)  x15 reps   Shoulder Exercises: Pulleys   Flexion 2 minutes   Cryotherapy   Number Minutes Cryotherapy 15 Minutes   Cryotherapy Location Shoulder  LT   Type of Cryotherapy Ice pack          PT Education - 06/10/14 0833    Education provided No              Plan - 06/10/14 0834    Clinical Impression Statement Julia Alvester MorinBell si progressing well but needs to be careful to to inflam shoulder.    Pt will benefit from skilled therapeutic intervention in order to improve on the following deficits Pain;Decreased strength;Decreased range of motion   Rehab Potential Good   PT Frequency 2x / week   PT Duration 3 weeks   PT Treatment/Interventions Manual techniques;Cryotherapy;Passive range of motion;Therapeutic exercise   PT Next Visit Plan Continue with strength exercis as tolerated. stretch shoulder as tolerated   Consulted and Agree with Plan of Care Patient        Problem List Patient Active Problem List  Diagnosis Date Noted  . Allergic rhinitis 04/12/2014  . Persistent asthma 04/12/2014  . Dyspnea 04/12/2014  . Left shoulder pain 02/07/2014  . 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 Intertrigo 03/30/2012  . OSA on CPAP 03/30/2012  . GERD (gastroesophageal reflux disease) 04/12/2011  . ABDOMINAL PAIN, EPIGASTRIC 05/04/2010  . UNSPECIFIED EUSTACHIAN TUBE DISORDER 02/23/2010  . OTITIS MEDIA, LEFT 02/23/2010  . INTERMITTENT VERTIGO 02/23/2010  . NAUSEA 01/18/2010  . LUQ PAIN 01/18/2010  . 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 CHEST PAIN 10/05/2009  . DEPRESSION 09/07/2009  . Obesity 07/17/2009  . Diabetes type 2, uncontrolled 04/14/2009  . 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 ELEVATED BP 10/02/2008  . B12 deficiency 08/30/2007  . VITAMIN D DEFICIENCY 08/30/2007  . HAIR LOSS 08/30/2007  . PARESTHESIA 08/30/2007  . WEIGHT GAIN 08/30/2007  . ANEMIA-IRON  DEFICIENCY 05/28/2007                                              Caprice RedChasse, Jeyren Danowski M 06/10/2014, 8:38 AM

## 2014-06-13 ENCOUNTER — Ambulatory Visit: Payer: 59

## 2014-06-13 DIAGNOSIS — R531 Weakness: Secondary | ICD-10-CM

## 2014-06-13 DIAGNOSIS — M25512 Pain in left shoulder: Secondary | ICD-10-CM

## 2014-06-13 DIAGNOSIS — Z5189 Encounter for other specified aftercare: Secondary | ICD-10-CM | POA: Diagnosis not present

## 2014-06-13 DIAGNOSIS — M25612 Stiffness of left shoulder, not elsewhere classified: Secondary | ICD-10-CM

## 2014-06-13 NOTE — Therapy (Signed)
Physical Therapy Treatment  Patient Details  Name: Shauna Hughanya B Dunnaway MRN: [id number] Date of Birth: 10/19/1970  Encounter Date: 06/13/2014      PT End of Session - 06/13/14 0846    Visit Number 7   Number of Visits 12   PT Start Time 0800   PT Stop Time 0850   PT Time Calculation (min) 50 min   Activity Tolerance Patient tolerated treatment well;Patient limited by pain   Behavior During Therapy [facility] for tasks assessed/performed      Past Medical History  Diagnosis Date  . Anemia     iron deficiency  . Obesity     BMI=44  . Vitamin B12 deficiency   . Vitamin D deficiency   . Asthma   . Sleep apnea     on CPAP  . Depression 2011  . Diabetes mellitus 2010    type II  . GERD (gastroesophageal reflux disease) 2004  . Hypertension 2008  . Shortness of breath     Past Surgical History  Procedure Laterality Date  . Fibroidectomy  2002  . Tonsillectomy and adenoidectomy    . Cesarean section    . Myomectomy    . Shoulder arthroscopy Left 04/30/2014    Procedure: Left Shoulder Arthroscopy and Debridement;  Surgeon: Nadara MustardMarcus Duda V, MD;  [facility];  Service: Orthopedics;  Laterality: Left;    There were no vitals taken for this visit.  Visit Diagnosis:  Pain in joint, shoulder region, left  Weakness  Stiffness of joint, shoulder region, left      Subjective Assessment - 06/13/14 0804    Symptoms Its ok. Sore after PT Tuesday   Currently in Pain? Yes   Pain Score 1    Pain Location Shoulder   Pain Orientation Left   Pain Descriptors / Indicators Aching   Pain Type --  sub acute   Pain Onset More than a month ago   Pain Frequency Intermittent   Aggravating Factors  exerciseing, Sitting at desk   Pain Relieving Factors rest, cold   Effect of Pain on Daily Activities Sweep but with some pain   Multiple Pain Sites No          [facility] PT Assessment - 06/13/14 0824    Assessment   Medical Diagnosis LT Shoulder debridement   Onset Date 04/30/14   Next MD Visit  06/16/14   AROM   Left Shoulder Flexion 141 Degrees   Left Shoulder ABduction 164 Degrees   Left Shoulder Internal Rotation 45 Degrees   Left Shoulder External Rotation 102 Degrees          [facility] Adult PT Treatment/Exercise - 06/13/14 0808    Exercises   Exercises Shoulder  UBE L2 5 minutes   Shoulder Exercises: Supine   Protraction Strengthening;Left;15 reps   Flexion Strengthening;Left;15 reps   Shoulder Exercises: Seated   Other Seated Exercises overhead press with 3 poundsx15   Other Seated Exercises bicep curls x15 5 pounds   Shoulder Exercises: Sidelying   External Rotation Strengthening;Left;15 reps   External Rotation Weight (lbs) 3   ABduction Strengthening;15 reps;Left   ABduction Weight (lbs) 3   Shoulder Exercises: Standing   Internal Rotation Strengthening;Left;15 reps;Theraband  red   Row 15 reps;Left;Strengthening;Theraband  red   Shoulder Exercises: Pulleys   Flexion 3 minutes          PT Education - 06/13/14 0845    Education provided Yes   Education Details Designer, fashion/clothinglifting mechanics   Person(s) Educated Patient  Methods Explanation;Demonstration;Tactile cues;Verbal cues   Comprehension Verbalized understanding;Returned demonstration;Need further instruction          PT Short Term Goals - 06/13/14 0947    PT SHORT TERM GOAL #1   Title Independent with initial HEP   Time 2   Period Weeks   Status Achieved   PT SHORT TERM GOAL #2   Title Pain decreased 25% with self care   Time 3   Period Weeks   Status Achieved   PT SHORT TERM GOAL #3   Title Active LT shoulder flexion 130 degrees or more    Status Achieved   PT SHORT TERM GOAL #4   Title active Lt shoulder abduction 125 degrees   Status Achieved          PT Long Term Goals - 06/13/14 0948    PT LONG TERM GOAL #1   Title Understanding of use of cold/RICE   Status Achieved   PT LONG TERM GOAL #2   Title Independent with advance HEP   Status On-going   PT LONG TERM GOAL #3   Title  Report decreased pain by 75% with normal home tasks of sweeping , mopping, vacuuming   Baseline She is resuming these activity but has a moderate level of pain with them   Time 4   Period Weeks   Status On-going   PT LONG TERM GOAL #4   Title improve actie Lt shoulder motion equal RT to allow full use LT arm   Time 4   Period Weeks   PT LONG TERM GOAL #5   Title Be able to put 3 pounds on shelf at head level or higher for access into upper cabinets   Time 4   Period Weeks   Status On-going   PT LONG TERM GOAL #6   Title Return to full duty work   Time 4   Period Weeks   Status On-going          Plan - 06/13/14 0846    Clinical Impression Statement She is progressing toward goals    Pt will benefit from skilled therapeutic intervention in order to improve on the following deficits Impaired UE functional use;Pain;Decreased strength   Rehab Potential Good   PT Treatment/Interventions Therapeutic activities;Patient/family education;Therapeutic exercise   PT Next Visit Plan Cont strength exercise and review lifting   PT Home Exercise Plan Start weight next visit for home   Consulted and Agree with Plan of Care Patient   PT Plan Continue with progressive left shoulder ROM toward endrange and moderate level strengthening.          Problem List Patient Active Problem List   Diagnosis Date Noted  . Allergic rhinitis 04/12/2014  . Persistent asthma 04/12/2014  . Dyspnea 04/12/2014  . Left shoulder pain 02/07/2014  . 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 Intertrigo 03/30/2012  . OSA on CPAP 03/30/2012  . GERD (gastroesophageal reflux disease) 04/12/2011  . ABDOMINAL PAIN, EPIGASTRIC 05/04/2010  . UNSPECIFIED EUSTACHIAN TUBE DISORDER 02/23/2010  . OTITIS MEDIA, LEFT 02/23/2010  . INTERMITTENT VERTIGO 02/23/2010  . NAUSEA 01/18/2010  . LUQ PAIN 01/18/2010  . 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 CHEST PAIN 10/05/2009  . DEPRESSION 09/07/2009  . Obesity 07/17/2009  . Diabetes  type 2, uncontrolled 04/14/2009  . 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 ELEVATED BP 10/02/2008  . B12 deficiency 08/30/2007  . VITAMIN D DEFICIENCY 08/30/2007  . HAIR LOSS 08/30/2007  . PARESTHESIA 08/30/2007  . WEIGHT GAIN 08/30/2007  . ANEMIA-IRON  DEFICIENCY 05/28/2007                                              Caprice Red PT 06/13/2014, 9:52 AM

## 2014-06-13 NOTE — Patient Instructions (Addendum)
Instructed patient in lifting mechanics and how this can decreased stain to shoulder and back. She needed full instruction due to poor body mechanics. She ifted 10 pounds floor to head level 10 reps.  I demonstrated correct mechanics.Lifting Principles .Maintain proper posture and head alignment. .Slide object as close as possible before lifting. .Move obstacles out of the way. .Test before lifting; ask for help if too heavy. .Tighten stomach muscles without holding breath. .Use smooth movements; do not jerk. .Use legs to do the work, and pivot with feet. .Distribute the work load symmetrically and close to the center of trunk. .Push instead of pull whenever possible.  Copyright  VHI. All rights reserved.  Deep Squat   Squat and lift with both arms held against upper trunk. Tighten stomach muscles without holding breath. Use smooth movements to avoid jerking.  Copyright  VHI. All rights reserved.

## 2014-06-17 ENCOUNTER — Telehealth: Payer: Self-pay | Admitting: Pulmonary Disease

## 2014-06-17 ENCOUNTER — Ambulatory Visit (INDEPENDENT_AMBULATORY_CARE_PROVIDER_SITE_OTHER): Payer: 59 | Admitting: Nurse Practitioner

## 2014-06-17 DIAGNOSIS — R0609 Other forms of dyspnea: Secondary | ICD-10-CM

## 2014-06-17 NOTE — Progress Notes (Signed)
Exercise Treadmill Test  Pre-Exercise Testing Evaluation Rhythm: normal sinus  Rate: 90 bpm     Test  Exercise Tolerance Test Ordering MD: Charlton HawsPeter Nishan, MD  Interpreting MD: Ward Givenshris Raman Featherston, NP  Unique Test No: 1  Treadmill:  1  Indication for ETT: exertional dyspnea  Contraindication to ETT: No   Stress Modality: exercise - treadmill  Cardiac Imaging Performed: non   Protocol: standard Bruce - maximal  Max BP:  243/91  Max MPHR (bpm):  177 85% MPR (bpm):  150  MPHR obtained (bpm):  160 % MPHR obtained:  90  Reached 85% MPHR (min:sec):  2:45 Total Exercise Time (min-sec):  4:00  Workload in METS:  5.7 Borg Scale: 17  Reason ETT Terminated:  dyspnea    ST Segment Analysis At Rest: normal ST segments - no evidence of significant ST depression With Exercise: no evidence of significant ST depression  Other Information Arrhythmia:  Isolated PVC Angina during ETT:  absent (0) Quality of ETT:  diagnostic  ETT Interpretation:  normal - no evidence of ischemia by ST analysis  Comments: No acute ST/T changes with exercise.  Test ended in Stage II @ 4:00 secondary to dyspnea and hypertensive response to exercise with peak of 243/91 @ peak exercise.  Recommendations: Cont home BP meds. Hypertensive response to exercise/deconditioned. F/U Drs. Plotnikov & Sood as scheduled.

## 2014-06-17 NOTE — Telephone Encounter (Signed)
Exercise treadmill test 06/17/14 >> no ischemia.  Test stopped due to dyspnea, HTN.  Results d/w pt.  Explained that her dyspnea is most likely related to deconditioning.  I don't think she needs additional testing at this time.  She needs to start gradual exercise regimen to improve her stamina.

## 2014-06-18 ENCOUNTER — Ambulatory Visit: Payer: 59

## 2014-06-18 DIAGNOSIS — Z5189 Encounter for other specified aftercare: Secondary | ICD-10-CM | POA: Diagnosis not present

## 2014-06-18 DIAGNOSIS — M25612 Stiffness of left shoulder, not elsewhere classified: Secondary | ICD-10-CM

## 2014-06-18 DIAGNOSIS — M25512 Pain in left shoulder: Secondary | ICD-10-CM

## 2014-06-18 DIAGNOSIS — R531 Weakness: Secondary | ICD-10-CM

## 2014-06-18 NOTE — Therapy (Signed)
Physical Therapy Treatment  Patient Details  Name: Julia Estrada MRN: [id number] Date of Birth: 09/09/1970  Encounter Date: 06/18/2014      PT End of Session - 06/18/14 0742    Visit Number 8   Number of Visits 12   Date for PT Re-Evaluation 07/11/14   PT Start Time 0703   PT Stop Time 0753   PT Time Calculation (min) 50 min   Activity Tolerance --  Mild pain post lifitng so use cold pack post   [facility] for tasks assessed/performed      Past Medical History  Diagnosis Date  . Anemia     iron deficiency  . Obesity     BMI=44  . Vitamin B12 deficiency   . Vitamin D deficiency   . Asthma   . Sleep apnea     on CPAP  . Depression 2011  . Diabetes mellitus 2010    type II  . GERD (gastroesophageal reflux disease) 2004  . Hypertension 2008  . Shortness of breath     Past Surgical History  Procedure Laterality Date  . Fibroidectomy  2002  . Tonsillectomy and adenoidectomy    . Cesarean section    . Myomectomy    . Shoulder arthroscopy Left 04/30/2014    Procedure: Left Shoulder Arthroscopy and Debridement;  Surgeon: Nadara MustardMarcus Duda V, MD;  [facility];  Service: Orthopedics;  Laterality: Left;    There were no vitals taken for this visit.  Visit Diagnosis:  Pain in joint, shoulder region, left  Weakness  Stiffness of joint, shoulder region, left      Subjective Assessment - 06/18/14 0707    Symptoms No pain now.  Able to sleep on LT side now.    Currently in Pain? No/denies   Pain Onset More than a month ago   Pain Frequency Rarely   Aggravating Factors  sweeping   Effect of Pain on Daily Activities She is doing all normal activity at home and work though has some mld pain with sweeping.  she is not lifting heavy items.    Multiple Pain Sites No            [facility] Adult PT Treatment/Exercise - 06/18/14 0709    Shoulder Exercises: Seated   Other Seated Exercises x12 4 pounds   Other Seated Exercises bicep curls 6 pounds x15   Shoulder Exercises: Prone   Extension Strengthening;Left;15 reps   Extension Weight (lbs) 3   Horizontal ABduction 1 Strengthening;Left;12 reps   Horizontal ABduction 1 Weight (lbs) 2   Shoulder Exercises: Sidelying   External Rotation Strengthening;15 reps   External Rotation Weight (lbs) 3   ABduction Strengthening;Left;15 reps   ABduction Weight (lbs) 3   Shoulder Exercises: Standing   Internal Rotation Left;Strengthening;12 reps  7 pounds   Row Strengthening;Both;12 reps  freemotion  13 pounds   Other Standing Exercises freemotion triceps press with cue to depress scapul and stabilize x15 10 pounds   Other Standing Exercises Freemotion  foreward press 10 pounds x 15   Shoulder Exercises: Pulleys   Flexion 3 minutes          PT Education - 06/18/14 0741    Education provided Yes   Education Details scapula stbilization   Person(s) Educated Patient   Methods Explanation;Demonstration;Tactile cues;Verbal cues   Comprehension Verbalized understanding;Returned demonstration;Need further instruction              Plan - 06/18/14 0744    Clinical Impression  Statement She is improving with increased tolerance to pressure and weight.    Pt will benefit from skilled therapeutic intervention in order to improve on the following deficits Decreased strength;Pain;Impaired UE functional use   Rehab Potential Good   PT Frequency 2x / week   PT Duration 2 weeks   PT Treatment/Interventions Cryotherapy;Manual techniques;Therapeutic exercise;Therapeutic activities;Patient/family education   PT Next Visit Plan Progress strength as able   PT Home Exercise Plan HEP for strength   Consulted and Agree with Plan of Care Patient   PT Plan progress HEP/strengthening, measure range        Problem List Patient Active Problem List   Diagnosis Date Noted  . Allergic rhinitis 04/12/2014  . Persistent asthma 04/12/2014  . Dyspnea 04/12/2014  . Left shoulder pain 02/07/2014  .  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 Intertrigo 03/30/2012  . OSA on CPAP 03/30/2012  . GERD (gastroesophageal reflux disease) 04/12/2011  . ABDOMINAL PAIN, EPIGASTRIC 05/04/2010  . UNSPECIFIED EUSTACHIAN TUBE DISORDER 02/23/2010  . OTITIS MEDIA, LEFT 02/23/2010  . INTERMITTENT VERTIGO 02/23/2010  . NAUSEA 01/18/2010  . LUQ PAIN 01/18/2010  . 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 CHEST PAIN 10/05/2009  . DEPRESSION 09/07/2009  . Obesity 07/17/2009  . Diabetes type 2, uncontrolled 04/14/2009  . 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 ELEVATED BP 10/02/2008  . B12 deficiency 08/30/2007  . VITAMIN D DEFICIENCY 08/30/2007  . HAIR LOSS 08/30/2007  . PARESTHESIA 08/30/2007  . WEIGHT GAIN 08/30/2007  . ANEMIA-IRON DEFICIENCY 05/28/2007                                              Caprice RedChasse, April Carlyon M  PT 06/18/2014, 7:50 AM

## 2014-06-18 NOTE — Patient Instructions (Signed)
Instructed patient on scapula depression/retraction to stabilize with lifting and discussed rationale for doing this with UE exercise.

## 2014-06-20 ENCOUNTER — Ambulatory Visit: Payer: 59

## 2014-06-20 DIAGNOSIS — M25512 Pain in left shoulder: Secondary | ICD-10-CM

## 2014-06-20 DIAGNOSIS — R531 Weakness: Secondary | ICD-10-CM

## 2014-06-20 DIAGNOSIS — Z5189 Encounter for other specified aftercare: Secondary | ICD-10-CM | POA: Diagnosis not present

## 2014-06-20 DIAGNOSIS — M25612 Stiffness of left shoulder, not elsewhere classified: Secondary | ICD-10-CM

## 2014-06-20 NOTE — Therapy (Signed)
Physical Therapy Treatment  Patient Details  Name: Julia Estrada MRN: [id number] Date of Birth: 08/11/1970  Encounter Date: 06/20/2014      PT End of Session - 06/20/14 0841    Visit Number 9   Date for PT Re-Evaluation 07/11/14   PT Start Time 0803   PT Stop Time 0843   PT Time Calculation (min) 40 min   Activity Tolerance Patient tolerated treatment well      Past Medical History  Diagnosis Date  . Anemia     iron deficiency  . Obesity     BMI=44  . Vitamin B12 deficiency   . Vitamin D deficiency   . Asthma   . Sleep apnea     on CPAP  . Depression 2011  . Diabetes mellitus 2010    type II  . GERD (gastroesophageal reflux disease) 2004  . Hypertension 2008  . Shortness of breath     Past Surgical History  Procedure Laterality Date  . Fibroidectomy  2002  . Tonsillectomy and adenoidectomy    . Cesarean section    . Myomectomy    . Shoulder arthroscopy Left 04/30/2014    Procedure: Left Shoulder Arthroscopy and Debridement;  Surgeon: Nadara MustardMarcus Duda V, MD;  [facility];  Service: Orthopedics;  Laterality: Left;    There were no vitals taken for this visit.  Visit Diagnosis:  Pain in joint, shoulder region, left  Weakness  Stiffness of joint, shoulder region, left      Subjective Assessment - 06/20/14 0805    Symptoms Sore today. Did not do anythign out of ordinary, Started at work thes  AM   Pain Score 3    Pain Location Shoulder   Pain Orientation Left   Pain Descriptors / Indicators Aching;Sore   Pain Type Surgical pain   Pain Onset More than a month ago   Pain Frequency Occasional   Aggravating Factors  sweeping   Pain Relieving Factors rest ,cold   Multiple Pain Sites No            [facility] Adult PT Treatment/Exercise - 06/20/14 0807    Shoulder Exercises: Supine   Protraction Strengthening;Left;15 reps  5 pounds   Shoulder Exercises: Prone   Extension Strengthening;Left;15 reps   Extension Weight (lbs) 3   Horizontal ABduction 1  Strengthening;Left;12 reps;10 reps   Horizontal ABduction 1 Weight (lbs) 3   Shoulder Exercises: Sidelying   External Rotation Strengthening;Left;15 reps   External Rotation Weight (lbs) 3   ABduction Strengthening;Left;15 reps   ABduction Weight (lbs) 3   Shoulder Exercises: Standing   Internal Rotation Strengthening;Left;12 reps  3pounds   Row Strengthening;Both;15 reps  10 pounds free motion   Other Standing Exercises Freemotion 3 pounds tricep and scpula retraction wth press down to posterior LT hip   Other Standing Exercises Free motion foreward press 3 pounds x 12   Shoulder Exercises: Pulleys   Flexion 3 minutes   Ultrasound   Ultrasound Location LT shoulder   Manual Therapy   Manual Therapy Myofascial release  STW          PT Education - 06/20/14 0840    Education provided Yes   Education Details resting arm  to ease soreness, heat /cold as helps   Person(s) Educated Patient   Methods Explanation   Comprehension Verbalized understanding              Plan - 06/20/14 0841    Clinical Impression Statement She was sore today possible  from cold weather   Pt will benefit from skilled therapeutic intervention in order to improve on the following deficits Decreased strength;Pain;Impaired UE functional use   Rehab Potential Good   PT Frequency --  3 more visits and assess benefit   PT Treatment/Interventions Cryotherapy;Manual techniques;Therapeutic exercise;Therapeutic activities;Patient/family education   PT Next Visit Plan Progress strength as able   PT Home Exercise Plan HEP for strength   Consulted and Agree with Plan of Care Patient   PT Plan progress HEP/strengthening, measure range, Goal assessment        Problem List Patient Active Problem List   Diagnosis Date Noted  . Allergic rhinitis 04/12/2014  . Persistent asthma 04/12/2014  . Dyspnea 04/12/2014  . Left shoulder pain 02/07/2014  . 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 Intertrigo 03/30/2012   . OSA on CPAP 03/30/2012  . GERD (gastroesophageal reflux disease) 04/12/2011  . ABDOMINAL PAIN, EPIGASTRIC 05/04/2010  . UNSPECIFIED EUSTACHIAN TUBE DISORDER 02/23/2010  . OTITIS MEDIA, LEFT 02/23/2010  . INTERMITTENT VERTIGO 02/23/2010  . NAUSEA 01/18/2010  . LUQ PAIN 01/18/2010  . 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 CHEST PAIN 10/05/2009  . DEPRESSION 09/07/2009  . Obesity 07/17/2009  . Diabetes type 2, uncontrolled 04/14/2009  . 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 ELEVATED BP 10/02/2008  . B12 deficiency 08/30/2007  . VITAMIN D DEFICIENCY 08/30/2007  . HAIR LOSS 08/30/2007  . PARESTHESIA 08/30/2007  . WEIGHT GAIN 08/30/2007  . ANEMIA-IRON DEFICIENCY 05/28/2007                                              Caprice RedChasse, Terren Jandreau M  PT 06/20/2014, 8:45 AM

## 2014-06-20 NOTE — Patient Instructions (Signed)
Rest arm on pillow as able to allow muscle relaxation when sitting.

## 2014-06-25 ENCOUNTER — Ambulatory Visit: Payer: 59 | Admitting: Physical Therapy

## 2014-06-30 ENCOUNTER — Emergency Department (HOSPITAL_COMMUNITY): Payer: 59

## 2014-06-30 ENCOUNTER — Encounter (HOSPITAL_COMMUNITY): Payer: Self-pay | Admitting: Emergency Medicine

## 2014-06-30 ENCOUNTER — Emergency Department (HOSPITAL_COMMUNITY)
Admission: EM | Admit: 2014-06-30 | Discharge: 2014-06-30 | Disposition: A | Discharge status: Home / Self Care | Payer: 59 | Attending: Emergency Medicine | Admitting: Emergency Medicine

## 2014-06-30 DIAGNOSIS — G4730 Sleep apnea, unspecified: Secondary | ICD-10-CM | POA: Insufficient documentation

## 2014-06-30 DIAGNOSIS — J45909 Unspecified asthma, uncomplicated: Secondary | ICD-10-CM | POA: Diagnosis not present

## 2014-06-30 DIAGNOSIS — K219 Gastro-esophageal reflux disease without esophagitis: Secondary | ICD-10-CM | POA: Diagnosis not present

## 2014-06-30 DIAGNOSIS — R197 Diarrhea, unspecified: Secondary | ICD-10-CM | POA: Diagnosis not present

## 2014-06-30 DIAGNOSIS — E669 Obesity, unspecified: Secondary | ICD-10-CM | POA: Diagnosis not present

## 2014-06-30 DIAGNOSIS — Z3202 Encounter for pregnancy test, result negative: Secondary | ICD-10-CM | POA: Diagnosis not present

## 2014-06-30 DIAGNOSIS — D519 Vitamin B12 deficiency anemia, unspecified: Secondary | ICD-10-CM | POA: Diagnosis not present

## 2014-06-30 DIAGNOSIS — R1012 Left upper quadrant pain: Secondary | ICD-10-CM | POA: Diagnosis not present

## 2014-06-30 DIAGNOSIS — E119 Type 2 diabetes mellitus without complications: Secondary | ICD-10-CM | POA: Insufficient documentation

## 2014-06-30 DIAGNOSIS — Z79899 Other long term (current) drug therapy: Secondary | ICD-10-CM | POA: Diagnosis not present

## 2014-06-30 DIAGNOSIS — Z8659 Personal history of other mental and behavioral disorders: Secondary | ICD-10-CM | POA: Diagnosis not present

## 2014-06-30 DIAGNOSIS — Z9981 Dependence on supplemental oxygen: Secondary | ICD-10-CM | POA: Insufficient documentation

## 2014-06-30 DIAGNOSIS — I10 Essential (primary) hypertension: Secondary | ICD-10-CM | POA: Diagnosis not present

## 2014-06-30 DIAGNOSIS — D509 Iron deficiency anemia, unspecified: Secondary | ICD-10-CM | POA: Diagnosis not present

## 2014-06-30 DIAGNOSIS — R1032 Left lower quadrant pain: Secondary | ICD-10-CM

## 2014-06-30 LAB — CBC WITH DIFFERENTIAL/PLATELET
BASOS PCT: 0 % (ref 0–1)
Basophils Absolute: 0 10*3/uL (ref 0.0–0.1)
EOS ABS: 0.1 10*3/uL (ref 0.0–0.7)
EOS PCT: 2 % (ref 0–5)
HEMATOCRIT: 29.1 % — AB (ref 36.0–46.0)
HEMOGLOBIN: 9.1 g/dL — AB (ref 12.0–15.0)
LYMPHS PCT: 28 % (ref 12–46)
Lymphs Abs: 2 10*3/uL (ref 0.7–4.0)
MCH: 23 pg — ABNORMAL LOW (ref 26.0–34.0)
MCHC: 31.3 g/dL (ref 30.0–36.0)
MCV: 73.5 fL — ABNORMAL LOW (ref 78.0–100.0)
Monocytes Absolute: 0.5 10*3/uL (ref 0.1–1.0)
Monocytes Relative: 7 % (ref 3–12)
NEUTROS ABS: 4.6 10*3/uL (ref 1.7–7.7)
Neutrophils Relative %: 63 % (ref 43–77)
Platelets: 399 10*3/uL (ref 150–400)
RBC: 3.96 MIL/uL (ref 3.87–5.11)
RDW: 15.8 % — ABNORMAL HIGH (ref 11.5–15.5)
WBC: 7.2 10*3/uL (ref 4.0–10.5)

## 2014-06-30 LAB — URINALYSIS, ROUTINE W REFLEX MICROSCOPIC
Bilirubin Urine: NEGATIVE
GLUCOSE, UA: NEGATIVE mg/dL
Hgb urine dipstick: NEGATIVE
KETONES UR: NEGATIVE mg/dL
LEUKOCYTES UA: NEGATIVE
NITRITE: NEGATIVE
PH: 5.5 (ref 5.0–8.0)
Protein, ur: NEGATIVE mg/dL
SPECIFIC GRAVITY, URINE: 1.022 (ref 1.005–1.030)
Urobilinogen, UA: 1 mg/dL (ref 0.0–1.0)

## 2014-06-30 LAB — BASIC METABOLIC PANEL
Anion gap: 15 (ref 5–15)
BUN: 11 mg/dL (ref 6–23)
CHLORIDE: 97 meq/L (ref 96–112)
CO2: 24 meq/L (ref 19–32)
Calcium: 9.7 mg/dL (ref 8.4–10.5)
Creatinine, Ser: 0.67 mg/dL (ref 0.50–1.10)
GFR calc Af Amer: >90 mL/min (ref >90)
GFR calc non Af Amer: >90 mL/min (ref >90)
Glucose, Bld: 146 mg/dL — ABNORMAL HIGH (ref 70–99)
POTASSIUM: 3.9 meq/L (ref 3.7–5.3)
Sodium: 136 mEq/L — ABNORMAL LOW (ref 137–147)

## 2014-06-30 LAB — LIPASE, BLOOD: LIPASE: 24 U/L (ref 11–59)

## 2014-06-30 LAB — CBG MONITORING, ED: GLUCOSE-CAPILLARY: 144 mg/dL — AB (ref 70–99)

## 2014-06-30 LAB — PREGNANCY, URINE: Preg Test, Ur: NEGATIVE

## 2014-06-30 MED ORDER — SODIUM CHLORIDE 0.9 % IV BOLUS (SEPSIS)
500.0000 mL | Freq: Once | INTRAVENOUS | Status: AC
  Administered 2014-06-30: 500 mL via INTRAVENOUS

## 2014-06-30 MED ORDER — IOHEXOL 300 MG/ML  SOLN
50.0000 mL | Freq: Once | INTRAMUSCULAR | Status: AC | PRN
  Administered 2014-06-30: 50 mL via ORAL

## 2014-06-30 MED ORDER — IOHEXOL 300 MG/ML  SOLN
100.0000 mL | Freq: Once | INTRAMUSCULAR | Status: AC | PRN
  Administered 2014-06-30: 100 mL via INTRAVENOUS

## 2014-06-30 NOTE — ED Provider Notes (Signed)
CSN: [id number]     Arrival date & time 06/30/14  0720 History   First MD Initiated Contact with Patient 06/30/14 [phone]     Chief Complaint  Patient presents with  . Abdominal Pain     (Consider location/radiation/quality/duration/timing/severity/associated sxs/prior Treatment) HPI Comments: 43 year old female with obesity, diabetes, cystitis presents with recurrent left lower quadrant abdominal pain and nonbloody diarrhea for the past 4-5 days. This is different than her IBS history. Pain is worse with eating. No history of diverticulitis or abdominal surgeries in the past. Pain mild currently. Nonradiating  Patient is a 43 y.o. female presenting with abdominal pain. The history is provided by the patient.  Abdominal Pain Associated symptoms: no chest pain, no chills, no dysuria, no fever, no shortness of breath and no vomiting     Past Medical History  Diagnosis Date  . Anemia     iron deficiency  . Obesity     BMI=44  . Vitamin B12 deficiency   . Vitamin D deficiency   . Asthma   . Sleep apnea     on CPAP  . Depression 2011  . Diabetes mellitus 2010    type II  . GERD (gastroesophageal reflux disease) 2004  . Hypertension 2008  . Shortness of breath    Past Surgical History  Procedure Laterality Date  . Fibroidectomy  2002  . Tonsillectomy and adenoidectomy    . Cesarean section    . Myomectomy    . Shoulder arthroscopy Left 04/30/2014    Procedure: Left Shoulder Arthroscopy and Debridement;  Surgeon: Nadara Mustard, MD;  Location: [facility];  Service: Orthopedics;  Laterality: Left;   Family History  Problem Relation Age of Onset  . Hypertension Mother     father, sisters  . Diabetes Mother     sister  . Kidney cancer Mother   . Cancer Mother   . Cholelithiasis Sister   . Clotting disorder Daughter    History  Substance Use Topics  . Smoking status: Never Smoker   . Smokeless tobacco: Never Used  . Alcohol Use: No   OB History    No data available      Review of Systems  Constitutional: Negative for fever and chills.  HENT: Negative for congestion.   Eyes: Negative for visual disturbance.  Respiratory: Negative for shortness of breath.   Cardiovascular: Negative for chest pain.  Gastrointestinal: Positive for abdominal pain. Negative for vomiting.  Genitourinary: Negative for dysuria and flank pain.  Musculoskeletal: Negative for back pain, neck pain and neck stiffness.  Skin: Negative for rash.  Neurological: Negative for light-headedness and headaches.      Allergies  Codeine sulfate; Hydrochlorothiazide w-triamterene; Oxycodone-acetaminophen; Prednisone; and Victoza  Home Medications   Prior to Admission medications   Medication Sig Start Date End Date Taking? Authorizing Provider  diphenhydrAMINE (BENADRYL) 25 MG tablet Take 1 tablet (25 mg total) by mouth every 6 (six) hours as needed for itching or allergies (hives). 03/31/14  Yes Aleksei Plotnikov V, MD  ferrous sulfate 325 (65 FE) MG tablet Take 325 mg by mouth daily with breakfast.   Yes Historical Provider, MD  fexofenadine (ALLEGRA) 180 MG tablet Take 180 mg by mouth daily.   Yes Historical Provider, MD  fluticasone (FLONASE) 50 MCG/ACT nasal spray Place 2 sprays into both nostrils daily. Patient taking differently: Place 2 sprays into both nostrils daily as needed for allergies.  04/10/14  Yes Coralyn Helling, MD  ibuprofen (ADVIL,MOTRIN) 800 MG tablet Take 800 mg  by mouth every 8 (eight) hours as needed for fever, headache or mild pain.    Yes Historical Provider, MD  pantoprazole (PROTONIX) 40 MG tablet Take 1 tablet (40 mg total) by mouth daily. 09/23/13  Yes Tresa GarterAleksei Plotnikov V, MD  [facility] 1/35 tablet Take 1 tablet by mouth every morning. 02/04/14  Yes Historical Provider, MD  ranitidine (ZANTAC) 300 MG tablet Take 1 tablet by mouth at  bedtime. 04/28/14  Yes Aleksei Plotnikov V, MD  SitaGLIPtin-MetFORMIN HCl (JANUMET XR) 6811105610 MG TB24 Take 1 tablet by mouth daily.    Yes Historical Provider, MD  spironolactone (ALDACTONE) 100 MG tablet Take 1 tablet by mouth  daily 04/28/14  Yes Aleksei Plotnikov V, MD  triamcinolone cream (KENALOG) 0.5 % Apply 1 application topically 3 (three) times daily. On rash Patient taking differently: Apply 1 application topically 3 (three) times daily as needed (rash). On rash 03/31/14  Yes Aleksei Plotnikov V, MD  valACYclovir (VALTREX) 1000 MG tablet Take 1,000 mg by mouth daily.  02/02/14  Yes Historical Provider, MD  vitamin B-12 (CYANOCOBALAMIN) 1000 MCG tablet Take 1,000 mcg by mouth daily.   Yes Historical Provider, MD  Fluticasone-Salmeterol (ADVAIR DISKUS) 250-50 MCG/DOSE AEPB Inhale 1 puff into the lungs 2 (two) times daily. Patient not taking: Reported on 06/30/2014 04/10/14   Coralyn HellingVineet Sood, MD  HYDROcodone-acetaminophen (NORCO) 5-325 MG per tablet Take 1 tablet by mouth every 6 (six) hours as needed. Patient not taking: Reported on 06/30/2014 04/30/14   Nadara MustardMarcus Duda V, MD   BP 147/96 mmHg  Pulse 72  Temp(Src) 98.7 F (37.1 C) (Oral)  Resp 18  SpO2 99%  LMP 06/02/2014 Physical Exam  Constitutional: She is oriented to person, place, and time. She appears well-developed and well-nourished.  HENT:  Head: Normocephalic and atraumatic.  Eyes: Conjunctivae are normal. Right eye exhibits no discharge. Left eye exhibits no discharge.  Neck: Normal range of motion. Neck supple. No tracheal deviation present.  Cardiovascular: Normal rate and regular rhythm.   Pulmonary/Chest: Effort normal and breath sounds normal.  Abdominal: Soft. She exhibits no distension. There is tenderness (obese, left lower quadrant mild tenderness). There is no guarding.  Musculoskeletal: She exhibits no edema.  Neurological: She is alert and oriented to person, place, and time.  Skin: Skin is warm. No rash noted.  Psychiatric: She has a normal mood and affect.  Nursing note and vitals reviewed.   ED Course  Procedures (including critical care  time) Labs Review Labs Reviewed  BASIC METABOLIC PANEL - Abnormal; Notable for the following:    Sodium 136 (*)    Glucose, Bld 146 (*)    All other components within normal limits  CBC WITH DIFFERENTIAL - Abnormal; Notable for the following:    Hemoglobin 9.1 (*)    HCT 29.1 (*)    MCV 73.5 (*)    MCH 23.0 (*)    RDW 15.8 (*)    All other components within normal limits  CBG MONITORING, ED - Abnormal; Notable for the following:    Glucose-Capillary 144 (*)    All other components within normal limits  LIPASE, BLOOD  URINALYSIS, ROUTINE W REFLEX MICROSCOPIC  PREGNANCY, URINE    Imaging Review Ct Abdomen Pelvis W Contrast  06/30/2014   CLINICAL DATA:  Abdominal pain, diarrhea since 06/24/2014. Denies urinary symptoms.  EXAM: CT ABDOMEN AND PELVIS WITH CONTRAST  TECHNIQUE: Multidetector CT imaging of the abdomen and pelvis was performed using the standard protocol following bolus administration of intravenous contrast.  CONTRAST:  50mL OMNIPAQUE IOHEXOL 300 MG/ML SOLN, 100mL OMNIPAQUE IOHEXOL 300 MG/ML SOLN  COMPARISON:  None.  FINDINGS: The lung bases are clear.  The liver demonstrates no focal abnormality. There is no intrahepatic or extrahepatic biliary ductal dilatation. The gallbladder is normal. The spleen demonstrates no focal abnormality. The kidneys, adrenal glands and pancreas are normal. The bladder is unremarkable.  The stomach, duodenum, small intestine, and large intestine demonstrate no contrast extravasation or dilatation. There is no pneumoperitoneum, pneumatosis, or portal venous gas. There is no abdominal or pelvic free fluid. There is no lymphadenopathy.  The abdominal aorta is normal in caliber with atherosclerosis.  There are no lytic or sclerotic osseous lesions. Moderate bilateral facet arthropathy at L5-S1. There are mild degenerative changes of bilateral SI joints.  IMPRESSION: 1. No acute abdominal or pelvic pathology.   Electronically Signed   By: Elige KoHetal  Patel   On:  06/30/2014 09:41     EKG Interpretation None      MDM   Final diagnoses:  Left lower quadrant pain  Diarrhea  Anemia  Discussed differential diagnosis including diverticulitis/Colyte as, IBS, viral/toxin mediated diarrhea, kidney stone, other. Patient comfortable blood work and CT scan to confirm details that she's never had diverticulitis before.  CT scan results reviewed no acute findings. Patient has minimal pain in the ER. Follow-up outpatient.  Results and differential diagnosis were discussed with the patient/parent/guardian. Close follow up outpatient was discussed, comfortable with the plan.   Medications  sodium chloride 0.9 % bolus 500 mL (0 mLs Intravenous Stopped 06/30/14 0957)  iohexol (OMNIPAQUE) 300 MG/ML solution 50 mL (50 mLs Oral Contrast Given 06/30/14 0816)  iohexol (OMNIPAQUE) 300 MG/ML solution 100 mL (100 mLs Intravenous Contrast Given 06/30/14 0915)    Filed Vitals:   06/30/14 0740  BP: 147/96  Pulse: 72  Temp: 98.7 F (37.1 C)  TempSrc: Oral  Resp: 18  SpO2: 99%    Final diagnoses:  Left lower quadrant pain  Diarrhea        Enid SkeensJoshua M Loral Campi, MD 06/30/14 1006

## 2014-06-30 NOTE — Discharge Instructions (Signed)
If you were given medicines take as directed.  If you are on coumadin or contraceptives realize their levels and effectiveness is altered by many different medicines.  If you have any reaction (rash, tongues swelling, other) to the medicines stop taking and see a physician.   Please follow up as directed and return to the ER or see a physician for new or worsening symptoms.  Thank you. Filed Vitals:   06/30/14 0740  BP: 147/96  Pulse: 72  Temp: 98.7 F (37.1 C)  TempSrc: Oral  Resp: 18  SpO2: 99%

## 2014-06-30 NOTE — ED Notes (Signed)
Pt reports abdominal pain and diarrhea since 11/24. Denies N V. Pt reports 4-5 liquid BM. Denies blood in stools. Denies urinary symptoms.

## 2014-07-02 ENCOUNTER — Encounter: Payer: Self-pay | Admitting: Endocrinology

## 2014-07-02 ENCOUNTER — Ambulatory Visit (INDEPENDENT_AMBULATORY_CARE_PROVIDER_SITE_OTHER): Payer: 59 | Admitting: Endocrinology

## 2014-07-02 ENCOUNTER — Other Ambulatory Visit: Payer: Self-pay | Admitting: *Deleted

## 2014-07-02 VITALS — BP 124/80 | HR 86 | Temp 97.8°F | Resp 16 | Ht 66.0 in | Wt 295.6 lb

## 2014-07-02 DIAGNOSIS — E7800 Pure hypercholesterolemia, unspecified: Secondary | ICD-10-CM

## 2014-07-02 DIAGNOSIS — I10 Essential (primary) hypertension: Secondary | ICD-10-CM

## 2014-07-02 DIAGNOSIS — E669 Obesity, unspecified: Secondary | ICD-10-CM

## 2014-07-02 DIAGNOSIS — E1165 Type 2 diabetes mellitus with hyperglycemia: Secondary | ICD-10-CM

## 2014-07-02 DIAGNOSIS — IMO0002 Reserved for concepts with insufficient information to code with codable children: Secondary | ICD-10-CM

## 2014-07-02 MED ORDER — GLUCOSE BLOOD VI STRP: ORAL_STRIP | Status: DC

## 2014-07-02 MED ORDER — EMPAGLIFLOZIN-LINAGLIPTIN 10-5 MG PO TABS: 10.0000 mg | ORAL_TABLET | Freq: Every day | ORAL | Status: DC

## 2014-07-02 MED ORDER — METFORMIN HCL ER (MOD) 1000 MG PO TB24: 1000.0000 mg | ORAL_TABLET | Freq: Every day | ORAL | Status: DC

## 2014-07-02 NOTE — Patient Instructions (Addendum)
Please check blood sugars at least half the time about 2 hours after any meal and times per week on waking up.  Please bring blood sugar monitor to each visit  Change Janumet to 2 new Rxs  Spironolactone 1/2 daily

## 2014-07-02 NOTE — Progress Notes (Signed)
Patient ID: Julia Estrada, female   DOB: 11/06/1970, 43 y.o.   MRN: [id number]           Reason for Appointment: Consultation for Type 2 Diabetes  History of Present Illness:          Diagnosis: Type 2 diabetes mellitus, date of diagnosis: ?  2011        Past history:  Not clear when her diagnosis was first made but she probably did not have very high sugars. She was initially treated with metformin which was titrated to 2000 mg a day without side effects She was also sent to the diabetes classes at the time of diagnosis At some point she was also given Januvia which has been continued She was also tried on Victoza in 08/2012 but she could not tolerate it for more than a week because of significant nausea and vomiting.  She thinks she may have taken only the 0.6 mg dose Most of her A1c readings have been in the low 7+ range until this year  Recent history:  Her A1c has been over 8% this year and she thinks this is related to her inactivity and noncompliance She has had occasional steroids this year including 3 injections in her shoulder with the last one probably in July.  Also in August she was given prednisone at the ER for a week She is however checking her blood sugars only when she feels bad and mostly in the mornings She has not seen any significant high readings even though her A1c in 11/15 was 9.2 The last year or so she has been given Janumet XR instead of metformin and Januvia separately and is only getting 1 tablet daily       Oral hypoglycemic drugs the patient is taking are: Janumet XR 1 daily      Side effects from medications have been: Vomiting with Victoza which she tried for one week only     Compliance with the medical regimen: Poor  Hypoglycemia:   none  Glucose monitoring:  it regularly Glucometer: One Touch.      Blood Glucose readings  fasting readings ranging from 95-169  Self-care: The diet that the patient has been following is: none, does avoid regular soft  drinks   Meals: 3 meals per day. Breakfast is biscuit or cereal,   lunch is graded chicken or pork chop, dinner is fast food or pizza.  Has fruit cups chips or popcorn for snacks        Exercise: Has started walking for 2 weeks, 30 min at a time Dietician visit, most recent: at diagnosis                Weight history: Previous range 276 -309  Wt Readings from Last 3 Encounters:  07/02/14 295 lb 9.6 oz (134.083 kg)  06/06/14 296 lb (134.265 kg)  05/22/14 303 lb 6.4 oz (137.621 kg)    Glycemic control:   Lab Results  Component Value Date   HGBA1C 9.2* 06/03/2014   HGBA1C 8.2* 02/05/2014   HGBA1C 8.2* 08/06/2013   Lab Results  Component Value Date   LDLCALC 120* 04/09/2013   CREATININE 0.67 06/30/2014         Medication List       This list is accurate as of: 07/02/14  2:29 PM.  Always use your most recent med list.               diphenhydrAMINE 25 MG tablet  Commonly  known as:  BENADRYL  Take 1 tablet (25 mg total) by mouth every 6 (six) hours as needed for itching or allergies (hives).     ferrous sulfate 325 (65 FE) MG tablet  Take 325 mg by mouth daily with breakfast.     fexofenadine 180 MG tablet  Commonly known as:  ALLEGRA  Take 180 mg by mouth daily.     fluticasone 50 MCG/ACT nasal spray  Commonly known as:  FLONASE  Place 2 sprays into both nostrils daily.     Fluticasone-Salmeterol 250-50 MCG/DOSE Aepb  Commonly known as:  ADVAIR DISKUS  Inhale 1 puff into the lungs 2 (two) times daily.     HYDROcodone-acetaminophen 5-325 MG per tablet  Commonly known as:  NORCO  Take 1 tablet by mouth every 6 (six) hours as needed.     ibuprofen 800 MG tablet  Commonly known as:  ADVIL,MOTRIN  Take 800 mg by mouth every 8 (eight) hours as needed for fever, headache or mild pain.     JANUMET XR [phone] MG Tb24  Generic drug:  SitaGLIPtin-MetFORMIN HCl  Take 1 tablet by mouth daily.     pantoprazole 40 MG tablet  Commonly known as:  PROTONIX  Take 1  tablet (40 mg total) by mouth daily.     PIRMELLA 1/35 tablet  Generic drug:  norethindrone-ethinyl estradiol 1/35  Take 1 tablet by mouth every morning.     ranitidine 300 MG tablet  Commonly known as:  ZANTAC  Take 1 tablet by mouth at  bedtime.     spironolactone 100 MG tablet  Commonly known as:  ALDACTONE  Take 1 tablet by mouth  daily     triamcinolone cream 0.5 %  Commonly known as:  KENALOG  Apply 1 application topically 3 (three) times daily. On rash     valACYclovir 1000 MG tablet  Commonly known as:  VALTREX  Take 1,000 mg by mouth daily.     VENTOLIN HFA 108 (90 BASE) MCG/ACT inhaler  Generic drug:  albuterol     vitamin B-12 1000 MCG tablet  Commonly known as:  CYANOCOBALAMIN  Take 1,000 mcg by mouth daily.        Allergies:  Allergies  Allergen Reactions  . Codeine Sulfate Nausea And Vomiting  . Hydrochlorothiazide W-Triamterene     REACTION: cramping  . Oxycodone-Acetaminophen Nausea And Vomiting  . Prednisone Hives  . Victoza [Liraglutide]     n/v    Past Medical History  Diagnosis Date  . Anemia     iron deficiency  . Obesity     BMI=44  . Vitamin B12 deficiency   . Vitamin D deficiency   . Asthma   . Sleep apnea     on CPAP  . Depression 2011  . Diabetes mellitus 2010    type II  . GERD (gastroesophageal reflux disease) 2004  . Hypertension 2008  . Shortness of breath     Past Surgical History  Procedure Laterality Date  . Fibroidectomy  2002  . Tonsillectomy and adenoidectomy    . Cesarean section    . Myomectomy    . Shoulder arthroscopy Left 04/30/2014    Procedure: Left Shoulder Arthroscopy and Debridement;  Surgeon: Nadara MustardMarcus Duda V, MD;  [facility];  Service: Orthopedics;  Laterality: Left;    Family History  Problem Relation Age of Onset  . Hypertension Mother     father, sisters  . Diabetes Mother     sister  . Kidney  cancer Mother   . Cancer Mother   . Cholelithiasis Sister   . Clotting disorder Daughter      Social History:  reports that she has never smoked. She has never used smokeless tobacco. She reports that she does not drink alcohol or use illicit drugs.    Review of Systems       Vision is normal. Most recent eye exam was in 1/15 with Dr. Hazle Quant       Lipids: Not checked recently, has never been on medications       Lab Results  Component Value Date   CHOL 191 04/09/2013   HDL 42.70 04/09/2013   LDLCALC 120* 04/09/2013   LDLDIRECT 92.4 09/29/2008   TRIG 141.0 04/09/2013   CHOLHDL 4 04/09/2013                  Skin: No rash or infections     Thyroid:  No  unusual fatigue.  No history of thyroid disease     The blood pressure has been treated with Aldactone since 2008.  Previously tried on Maxzide which caused low potassium and cramps     No swelling of feet.     No shortness of breath or chest tightness on exertion.     Bowel habits: Normal.     Menses: These are regular but heavy      Cramps present in her feet and her side on and off      No joint  pains.          No history of Numbness, tingling or burning in feet       Physical Examination:  BP 124/80 mmHg  Pulse 86  Temp(Src) 97.8 F (36.6 C)  Resp 16  Ht 5\' 6"  (1.676 m)  Wt 295 lb 9.6 oz (134.083 kg)  BMI 47.73 kg/m2  SpO2 98%  LMP 06/02/2014  GENERAL:         Patient has generalized obesity.   HEENT:         Eye exam shows normal external appearance. Fundus exam shows no retinopathy. Oral exam shows normal mucosa .  NECK:  she has marked acanthosis of the back of the neck Thyroid is not enlarged and no nodules felt.   LUNGS:         Chest is symmetrical. Lungs are clear to auscultation.Marland Kitchen   HEART:         Heart sounds:  S1 and S2 are normal. No murmurs or clicks heard., no S3 or S4.   ABDOMEN:   There is no distention present. Liver and spleen are not palpable. No other mass or tenderness present.  EXTREMITIES:     There is no edema. No skin lesions present.Marland Kitchen  NEUROLOGICAL:   Vibration  sense is  mildly reduced in toes. Ankle jerks are absent bilaterally.          Diabetic foot exam shows normal monofilament sensation in the toes and plantar surfaces, no skin lesions or ulcers on the feet and normal posterior tibialis pedal pulses MUSCULOSKELETAL:       There is no enlargement or deformity of the joints. Spine is normal to inspection.Marland Kitchen   SKIN:       No rash or lesions of concern.        ASSESSMENT:  Diabetes type 2, uncontrolled    She has not had an A1c of under 7% in at least a couple of years and blood sugars are overall higher this year  She thinks this is from being unable to exercise but also has been significantly noncompliant with diet and glucose monitoring She does have significant obesity and has been unable to lose weight She does have significant insulin resistance as judged by her acanthosis She has had intolerance to Victoza and not clear if she can tolerate any other GLP-1 drugs; Tanzeum is not covered by her formulary at this time  Complications: None evident but does need to have evaluation of her urine microalbumin done  History of hypertension: Appears to be well controlled with Aldactone 100 mg and has stable potassium and renal function   LIPIDS: She has had mild hypercholesterolemia but needs follow-up of her levels which are not current  PLAN:   Trial of Jardiance in combination with [place]; she will use Glyxambi for convenience starting with 10/5 dosage.  Discussed benefits and possible side effects including candidiasis.  Since her blood pressure will improve with Jardiance she can reduce her Aldactone to 50 mg for now; will need follow-up electrolytes  Change Janumet to metformin ER and increase the metformin dose to 1000 mg twice a day    Continue walking program that she has started and increase as tolerated  Start watching diet better with more healthy choices using low fat meals and snacks  Consultation with dietitian again  Start  monitoring blood sugars regularly and do more readings at about 2 hours after meals    Check urine microalbumin    She needs follow-up of her lipids also and consider lipid-lowering drugs if LDL significantly over 100    Tensley Wery 07/02/2014, 2:29 PM   Note: This office note was prepared with Dragon voice recognition system technology. Any transcriptional errors that result from this process are unintentional.

## 2014-07-03 ENCOUNTER — Ambulatory Visit: Payer: 59 | Attending: Orthopedic Surgery

## 2014-07-03 DIAGNOSIS — M25519 Pain in unspecified shoulder: Secondary | ICD-10-CM | POA: Diagnosis not present

## 2014-07-03 DIAGNOSIS — Z5189 Encounter for other specified aftercare: Secondary | ICD-10-CM | POA: Diagnosis present

## 2014-07-03 DIAGNOSIS — R293 Abnormal posture: Secondary | ICD-10-CM | POA: Diagnosis not present

## 2014-07-03 DIAGNOSIS — M25619 Stiffness of unspecified shoulder, not elsewhere classified: Secondary | ICD-10-CM | POA: Insufficient documentation

## 2014-07-03 DIAGNOSIS — R531 Weakness: Secondary | ICD-10-CM

## 2014-07-03 DIAGNOSIS — M25612 Stiffness of left shoulder, not elsewhere classified: Secondary | ICD-10-CM

## 2014-07-03 DIAGNOSIS — M25512 Pain in left shoulder: Secondary | ICD-10-CM

## 2014-07-03 NOTE — Therapy (Signed)
[facility] [address] [place], [place], [address] Phone: [phone]   Fax:  [phone]  Physical Therapy Treatment  Patient Details  Name: Julia Estrada MRN: [id number] Date of Birth: 01/12/1971  Encounter Date: 07/03/2014      PT End of Session - 07/03/14 0728    Visit Number 10   Number of Visits 12   Date for PT Re-Evaluation 07/11/14   PT Start Time 0703   PT Stop Time 0741   PT Time Calculation (min) 38 min   Activity Tolerance Patient tolerated treatment well   Behavior During Therapy [facility] for tasks assessed/performed      Past Medical History  Diagnosis Date  . Anemia     iron deficiency  . Obesity     BMI=44  . Vitamin B12 deficiency   . Vitamin D deficiency   . Asthma   . Sleep apnea     on CPAP  . Depression 2011  . Diabetes mellitus 2010    type II  . GERD (gastroesophageal reflux disease) 2004  . Hypertension 2008  . Shortness of breath     Past Surgical History  Procedure Laterality Date  . Fibroidectomy  2002  . Tonsillectomy and adenoidectomy    . Cesarean section    . Myomectomy    . Shoulder arthroscopy Left 04/30/2014    Procedure: Left Shoulder Arthroscopy and Debridement;  Surgeon: Nadara MustardMarcus Duda V, MD;  [facility];  Service: Orthopedics;  Laterality: Left;    LMP 06/02/2014  Visit Diagnosis:  Pain in joint, shoulder region, left  Weakness  Stiffness of joint, shoulder region, left      Subjective Assessment - 07/03/14 0708    Symptoms The shoulder feels fine without pain.     Currently in Pain? No/denies   Multiple Pain Sites No            [facility] Adult PT Treatment/Exercise - 07/03/14 0709    Shoulder Exercises: Seated   Other Seated Exercises Bicep curls 6 pounds x15   Shoulder Exercises: Prone   Extension Strengthening;Left;12 reps   Extension Weight (lbs) 4   Horizontal ABduction 1 Strengthening;Left;12 reps   Horizontal ABduction 1 Weight (lbs) 4   Other Prone Exercises  UBE 6 minutes L2   Shoulder Exercises: Sidelying   External Rotation Strengthening;Left;12 reps   External Rotation Weight (lbs) 4   ABduction Strengthening;Left;15 reps   ABduction Weight (lbs) 4   Shoulder Exercises: Standing   Internal Rotation Strengthening;Left;15 reps  freemotion   Internal Rotation Weight (lbs) 3   Row Strengthening;Left;15 reps   Row Weight (lbs) 10 pounds   Other Standing Exercises Freemotion foreward press ans scapula depression with extensin x15 each LT. 7 pounds   Other Standing Exercises Stabilization with ball compressiion and ball movements 1 min x3    Shoulder Exercises: Pulleys   Flexion 3 minutes              PT Long Term Goals - 07/03/14 0730    PT LONG TERM GOAL #3   Title Report decreased pain by 75% with normal home tasks of sweeping , mopping, vacuuming   Status Achieved   PT LONG TERM GOAL #6   Title Return to full duty work   Status Achieved          Plan - 07/03/14 0738    Clinical Impression Statement She wss able to complete session without pain . She is progressing toward goals and may benefit from  progression of HEP /strengthening   Pt will benefit from skilled therapeutic intervention in order to improve on the following deficits Decreased strength;Pain;Impaired UE functional use   Rehab Potential Good   PT Frequency 2x / week   PT Duration 3 weeks   PT Treatment/Interventions Cryotherapy;Manual techniques;Therapeutic exercise;Therapeutic activities;Patient/family education   PT Next Visit Plan Progress strength as able   PT Home Exercise Plan HEP for strength   Consulted and Agree with Plan of Care Patient   PT Plan progress HEP/strengthening, measure range, Goal assessment                               Problem List Patient Active Problem List   Diagnosis Date Noted  . Allergic rhinitis 04/12/2014  . Persistent asthma 04/12/2014  . Dyspnea 04/12/2014  . Left shoulder pain 02/07/2014  .  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 Intertrigo 03/30/2012  . OSA on CPAP 03/30/2012  . GERD (gastroesophageal reflux disease) 04/12/2011  . ABDOMINAL PAIN, EPIGASTRIC 05/04/2010  . UNSPECIFIED EUSTACHIAN TUBE DISORDER 02/23/2010  . OTITIS MEDIA, LEFT 02/23/2010  . INTERMITTENT VERTIGO 02/23/2010  . NAUSEA 01/18/2010  . LUQ PAIN 01/18/2010  . 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 CHEST PAIN 10/05/2009  . DEPRESSION 09/07/2009  . Obesity 07/17/2009  . Diabetes type 2, uncontrolled 04/14/2009  . 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 ELEVATED BP 10/02/2008  . B12 deficiency 08/30/2007  . VITAMIN D DEFICIENCY 08/30/2007  . HAIR LOSS 08/30/2007  . PARESTHESIA 08/30/2007  . WEIGHT GAIN 08/30/2007  . ANEMIA-IRON DEFICIENCY 05/28/2007    Caprice RedChasse, Houa Ackert M PT 07/03/2014, 7:40 AM

## 2014-07-04 ENCOUNTER — Other Ambulatory Visit: Payer: Self-pay | Admitting: *Deleted

## 2014-07-04 ENCOUNTER — Telehealth: Payer: Self-pay | Admitting: Endocrinology

## 2014-07-04 MED ORDER — ONETOUCH ULTRASOFT LANCETS MISC: Status: DC

## 2014-07-04 NOTE — Telephone Encounter (Signed)
rx sent

## 2014-07-04 NOTE — Telephone Encounter (Signed)
Patient called stating that her lancets were never called in   [facility] [address]   Please advise    Thank you

## 2014-07-08 ENCOUNTER — Ambulatory Visit: Payer: 59

## 2014-07-08 ENCOUNTER — Other Ambulatory Visit: Payer: Self-pay | Admitting: *Deleted

## 2014-07-08 DIAGNOSIS — Z5189 Encounter for other specified aftercare: Secondary | ICD-10-CM | POA: Diagnosis not present

## 2014-07-08 DIAGNOSIS — M25612 Stiffness of left shoulder, not elsewhere classified: Secondary | ICD-10-CM

## 2014-07-08 DIAGNOSIS — M25512 Pain in left shoulder: Secondary | ICD-10-CM

## 2014-07-08 DIAGNOSIS — R531 Weakness: Secondary | ICD-10-CM

## 2014-07-08 MED ORDER — ONETOUCH DELICA LANCETS FINE MISC: Status: DC

## 2014-07-08 NOTE — Patient Instructions (Signed)
We discussed use of machines at gym and having instructor guide her initially on set up of UE machines. Discussed progress and significant improvement with pain and strength. Discussed need to continue exercises long term and expect 9-12 months to max improvement with strngth and functional tasks.

## 2014-07-08 NOTE — Therapy (Signed)
[facility] [address] [place], [place], [address] Phone: [phone]   Fax:  [phone]  Physical Therapy Treatment  Patient Details  Name: Julia Estrada MRN: [id number] Date of Birth: 12/19/1970  Encounter Date: 07/08/2014      PT End of Session - 07/08/14 0750    Visit Number 11   Number of Visits 12   Date for PT Re-Evaluation 07/11/14   PT Start Time 0703   PT Stop Time 0745   PT Time Calculation (min) 42 min   Activity Tolerance Patient tolerated treatment well  No pain at end   Behavior During Therapy [facility] for tasks assessed/performed      Past Medical History  Diagnosis Date  . Anemia     iron deficiency  . Obesity     BMI=44  . Vitamin B12 deficiency   . Vitamin D deficiency   . Asthma   . Sleep apnea     on CPAP  . Depression 2011  . Diabetes mellitus 2010    type II  . GERD (gastroesophageal reflux disease) 2004  . Hypertension 2008  . Shortness of breath     Past Surgical History  Procedure Laterality Date  . Fibroidectomy  2002  . Tonsillectomy and adenoidectomy    . Cesarean section    . Myomectomy    . Shoulder arthroscopy Left 04/30/2014    Procedure: Left Shoulder Arthroscopy and Debridement;  Surgeon: Nadara MustardMarcus Duda V, MD;  [facility];  Service: Orthopedics;  Laterality: Left;    LMP 06/02/2014  Visit Diagnosis:  Pain in joint, shoulder region, left  Weakness  Stiffness of joint, shoulder region, left      Subjective Assessment - 07/08/14 0706    Symptoms The shoulder feels fine without pain.     Patient Stated Goals Be  able to do normal activity without pain.    Currently in Pain? No/denies   Pain Score 0-No pain   Pain Location Shoulder   Pain Orientation Left   Pain Onset More than a month ago   Pain Frequency Rarely   Aggravating Factors  Mild pain with sweeping   Pain Relieving Factors rest cold   Multiple Pain Sites No          [facility] PT Assessment - 07/07/14 0708    Observation/Other Assessments   Focus on Therapeutic Outcomes (FOTO)  29% disabled on 07/03/14. Predicted was 33 % so she beat predicted outcome of 33%          [facility] Adult PT Treatment/Exercise - 07/08/14 0730    Exercises   Exercises --  She was able to put 10 pounds to head level without incr pai   Shoulder Exercises: Seated   Other Seated Exercises 6 pounds x20   Other Seated Exercises Pull down 30 pounds lat bar x12   Shoulder Exercises: Prone   Other Prone Exercises UBE 5 minutes L2   Shoulder Exercises: Standing   External Rotation Strengthening;12 reps  freemotion 3 pounds    Internal Rotation Strengthening;Left;12 reps   Internal Rotation Weight (lbs) 3   Row Strengthening;Left;20 reps;Theraband  redx10 and blue x10   Other Standing Exercises freemotion forward press and scapula depression with shoulder extension x12   Other Standing Exercises stabilization with ball compression    Shoulder Exercises: Pulleys   Flexion 3 minutes          PT Education - 07/08/14 0749    Education provided Yes   Education Details use  of macines for stenght   Person(s) Educated Patient   Methods Explanation;Demonstration;Verbal cues   Comprehension Verbalized understanding          PT Short Term Goals - 07/08/14 0709    PT SHORT TERM GOAL #1   Title Independent with initial HEP   Status Achieved   PT SHORT TERM GOAL #2   Title Pain decreased 25% with self care   Status Achieved   PT SHORT TERM GOAL #3   Title Active LT shoulder flexion 130 degrees or more    Status Achieved   PT SHORT TERM GOAL #4   Title active Lt shoulder abduction 125 degrees   Status Achieved          PT Long Term Goals - 07/08/14 0752    PT LONG TERM GOAL #5   Title Be able to put 3 pounds on shelf at head level or higher for access into upper cabinets   Status Achieved          Plan - 07/08/14 0750    Clinical Impression Statement She continues to have weaknes in LT shoulder but is  functionally doing very well now. She is having more days witout pain , She can twist hair now for short periods   Pt will benefit from skilled therapeutic intervention in order to improve on the following deficits Decreased strength;Pain;Impaired UE functional use   Rehab Potential Good   PT Frequency 2x / week   PT Duration 2 weeks   PT Treatment/Interventions Cryotherapy;Manual techniques;Therapeutic exercise;Therapeutic activities;Patient/family education   PT Next Visit Plan Progress strength as able   PT Home Exercise Plan HEP for strength   Consulted and Agree with Plan of Care Patient   PT Plan progress HEP/strengthening, measure range, Goal assessment                               Problem List Patient Active Problem List   Diagnosis Date Noted  . Allergic rhinitis 04/12/2014  . Persistent asthma 04/12/2014  . Dyspnea 04/12/2014  . Left shoulder pain 02/07/2014  . 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 Intertrigo 03/30/2012  . OSA on CPAP 03/30/2012  . GERD (gastroesophageal reflux disease) 04/12/2011  . ABDOMINAL PAIN, EPIGASTRIC 05/04/2010  . UNSPECIFIED EUSTACHIAN TUBE DISORDER 02/23/2010  . OTITIS MEDIA, LEFT 02/23/2010  . INTERMITTENT VERTIGO 02/23/2010  . NAUSEA 01/18/2010  . LUQ PAIN 01/18/2010  . 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 CHEST PAIN 10/05/2009  . DEPRESSION 09/07/2009  . Obesity 07/17/2009  . Diabetes type 2, uncontrolled 04/14/2009  . 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 ELEVATED BP 10/02/2008  . B12 deficiency 08/30/2007  . VITAMIN D DEFICIENCY 08/30/2007  . HAIR LOSS 08/30/2007  . PARESTHESIA 08/30/2007  . WEIGHT GAIN 08/30/2007  . ANEMIA-IRON DEFICIENCY 05/28/2007    Caprice RedChasse, Norman Bier M PT 07/08/2014, 7:55 AM

## 2014-07-10 ENCOUNTER — Ambulatory Visit: Payer: 59

## 2014-07-10 DIAGNOSIS — M25612 Stiffness of left shoulder, not elsewhere classified: Secondary | ICD-10-CM

## 2014-07-10 DIAGNOSIS — R531 Weakness: Secondary | ICD-10-CM

## 2014-07-10 DIAGNOSIS — Z5189 Encounter for other specified aftercare: Secondary | ICD-10-CM | POA: Diagnosis not present

## 2014-07-10 DIAGNOSIS — M25512 Pain in left shoulder: Secondary | ICD-10-CM

## 2014-07-10 NOTE — Patient Instructions (Signed)
Reviewed basic exercise HEP and she was not interested in additional exercises.

## 2014-07-10 NOTE — Therapy (Signed)
Outpatient Rehabilitation [facility] [address] [place], [place], [address] Phone: [phone]   Fax:  [phone]  Physical Therapy Treatment  Patient Details  Name: Julia Estrada MRN: [id number] Date of Birth: February 22, 1971  Encounter Date: 07/10/2014      PT End of Session - 07/10/14 0759    Visit Number 12   Number of Visits 12   Activity Tolerance Patient tolerated treatment well   Behavior During Therapy [facility] for tasks assessed/performed      Past Medical History  Diagnosis Date  . Anemia     iron deficiency  . Obesity     BMI=44  . Vitamin B12 deficiency   . Vitamin D deficiency   . Asthma   . Sleep apnea     on CPAP  . Depression 2011  . Diabetes mellitus 2010    type II  . GERD (gastroesophageal reflux disease) 2004  . Hypertension 2008  . Shortness of breath     Past Surgical History  Procedure Laterality Date  . Fibroidectomy  2002  . Tonsillectomy and adenoidectomy    . Cesarean section    . Myomectomy    . Shoulder arthroscopy Left 04/30/2014    Procedure: Left Shoulder Arthroscopy and Debridement;  Surgeon: Newt Minion, MD;  Location: [facility];  Service: Orthopedics;  Laterality: Left;    LMP 06/02/2014  Visit Diagnosis:  Pain in joint, shoulder region, left  Weakness  Stiffness of joint, shoulder region, left      Subjective Assessment - 07/10/14 0714    Symptoms Baack spasms after last visit ,   No shoulder pain today.   Currently in Pain? No/denies   Multiple Pain Sites No  LBP           [facility] PT Assessment - 07/10/14 0752    AROM   Left Shoulder Flexion 135 Degrees   Left Shoulder ABduction 135 Degrees   Left Shoulder External Rotation 85 Degrees   Strength   Overall Strength Comments 4+/5 to 5-/5 LT shoulder . She is liftring 5 pounds comfortable to head height and can do as much as 10 pounds without pain.           [facility] PT Treatment/Exercise - 07/10/14 0716    Exercises   Exercises --  UBE 6 min L2    Shoulder Exercises: Supine   Horizontal ABduction Strengthening;Left;15 reps   Horizontal ABduction Weight (lbs) 3   Other Supine Exercises bench press 5 ppounds x20   Shoulder Exercises: Seated   Other Seated Exercises Pully x4 minutes overhead stretching   Other Seated Exercises bicep curls x15 5 pounds   Shoulder Exercises: Sidelying   External Rotation Strengthening;15 reps;Left   External Rotation Weight (lbs) 3   External Rotation Limitations verbal cues to not extend elb   Other Sidelying Exercises Horizontal Abd  x15 3 pounds   Shoulder Exercises: Standing   Other Standing Exercises 5 ppound reach to head height cabinet 2x10   Shoulder Exercises: ROM/Strengthening   UBE (Upper Arm Bike) 6 minutes L2   Wall Pushups 10 reps  2 sets into orage theraball            PT Short Term Goals - 07/10/14 0730    PT SHORT TERM GOAL #1   Status Achieved   PT SHORT TERM GOAL #2   Title Pain decreased 25% with self care   PT SHORT TERM GOAL #3   Title Active LT shoulder flexion 130 degrees or  more    PT SHORT TERM GOAL #4   Title active Lt shoulder abduction 125 degrees   Status Achieved          PT Long Term Goals - 07/10/14 0731    PT LONG TERM GOAL #1   Title Understanding of use of cold/RICE   Status Achieved   PT LONG TERM GOAL #2   Title Independent with advance HEP   Status Achieved   PT LONG TERM GOAL #3   Title Report decreased pain by 75% with normal home tasks of sweeping , mopping, vacuuming   Status Achieved   PT LONG TERM GOAL #4   Title improve actie Lt shoulder motion equal RT to allow full use LT arm   Baseline  She has about 10% decr. motion on Lt   Status Achieved   PT LONG TERM GOAL #5   Title Be able to put 3 pounds on shelf at head level or higher for access into upper cabinets   Status Achieved   PT LONG TERM GOAL #6   Title Return to full duty work   Status Achieved          Plan - 07/10/14 0759    Clinical Impression Statement She  is agreeable to discharge as she is functioal with  no pain except sweeping/vacumm but this is better.    PT Next Visit Plan Discharge to MD   PT Plan Discharge to MD     PHYSICAL THERAPY DISCHARGE SUMMARY  Visits from Start of Care: 12  Current functional level related to goals / functional outcomes:  She has met all goals Remaining deficits: Mild decr active range and strength but doing all activity now at work and home She says she will continue at gym   Education / Equipment:HEP Plan: Patient agrees to discharge.  Patient goals were met. Patient is being discharged due to meeting the stated rehab goals.  ?????                              Problem List Patient Active Problem List   Diagnosis Date Noted  . Allergic rhinitis 04/12/2014  . Persistent asthma 04/12/2014  . Dyspnea 04/12/2014  . Left shoulder pain 02/07/2014  . 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 Intertrigo 03/30/2012  . OSA on CPAP 03/30/2012  . GERD (gastroesophageal reflux disease) 04/12/2011  . ABDOMINAL PAIN, EPIGASTRIC 05/04/2010  . UNSPECIFIED EUSTACHIAN TUBE DISORDER 02/23/2010  . OTITIS MEDIA, LEFT 02/23/2010  . INTERMITTENT VERTIGO 02/23/2010  . NAUSEA 01/18/2010  . LUQ PAIN 01/18/2010  . 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 CHEST PAIN 10/05/2009  . DEPRESSION 09/07/2009  . Obesity 07/17/2009  . Diabetes type 2, uncontrolled 04/14/2009  . 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 ELEVATED BP 10/02/2008  . B12 deficiency 08/30/2007  . VITAMIN D DEFICIENCY 08/30/2007  . HAIR LOSS 08/30/2007  . PARESTHESIA 08/30/2007  . WEIGHT GAIN 08/30/2007  . ANEMIA-IRON DEFICIENCY 05/28/2007    Darrel Hoover PT 07/10/2014, 8:27 AM

## 2014-07-18 ENCOUNTER — Other Ambulatory Visit: Payer: Self-pay | Admitting: *Deleted

## 2014-07-18 ENCOUNTER — Telehealth: Payer: Self-pay | Admitting: Endocrinology

## 2014-07-18 MED ORDER — FLUCONAZOLE 150 MG PO TABS: 150.0000 mg | ORAL_TABLET | Freq: Once | ORAL | Status: DC

## 2014-07-18 NOTE — Telephone Encounter (Signed)
Pt needs rx for diflucan this am to [facility] on [address].  She has a yeast infection please

## 2014-07-18 NOTE — Telephone Encounter (Signed)
Okay, 150 mg, 1 tablet with 1 refill

## 2014-07-18 NOTE — Telephone Encounter (Signed)
Please see below and advise if okay to send? 

## 2014-07-22 ENCOUNTER — Other Ambulatory Visit (INDEPENDENT_AMBULATORY_CARE_PROVIDER_SITE_OTHER): Payer: 59

## 2014-07-22 DIAGNOSIS — E1165 Type 2 diabetes mellitus with hyperglycemia: Secondary | ICD-10-CM

## 2014-07-22 DIAGNOSIS — IMO0002 Reserved for concepts with insufficient information to code with codable children: Secondary | ICD-10-CM

## 2014-07-22 DIAGNOSIS — E7800 Pure hypercholesterolemia, unspecified: Secondary | ICD-10-CM

## 2014-07-22 LAB — BASIC METABOLIC PANEL
BUN: 14 mg/dL (ref 6–23)
CO2: 24 mEq/L (ref 19–32)
CREATININE: 0.7 mg/dL (ref 0.4–1.2)
Calcium: 9.1 mg/dL (ref 8.4–10.5)
Chloride: 103 mEq/L (ref 96–112)
GFR: 110.01 mL/min (ref >60.00)
Glucose, Bld: 121 mg/dL — ABNORMAL HIGH (ref 70–99)
Potassium: 3.7 mEq/L (ref 3.5–5.1)
Sodium: 135 mEq/L (ref 135–145)

## 2014-07-22 LAB — URINALYSIS, ROUTINE W REFLEX MICROSCOPIC
BILIRUBIN URINE: NEGATIVE
HGB URINE DIPSTICK: NEGATIVE
Ketones, ur: NEGATIVE
Leukocytes, UA: NEGATIVE
Nitrite: NEGATIVE
RBC / HPF: NONE SEEN (ref 0–?)
Specific Gravity, Urine: 1.025 (ref 1.000–1.030)
Urobilinogen, UA: 0.2 (ref 0.0–1.0)
pH: 6 (ref 5.0–8.0)

## 2014-07-22 LAB — LIPID PANEL
CHOL/HDL RATIO: 7
Cholesterol: 203 mg/dL — ABNORMAL HIGH (ref 0–200)
HDL: 29 mg/dL — ABNORMAL LOW (ref >39.00)
LDL CALC: 139 mg/dL — AB (ref 0–99)
NonHDL: 174
TRIGLYCERIDES: 174 mg/dL — AB (ref 0.0–149.0)
VLDL: 34.8 mg/dL (ref 0.0–40.0)

## 2014-07-22 LAB — MICROALBUMIN / CREATININE URINE RATIO
CREATININE, U: 151.1 mg/dL
MICROALB UR: 6.8 mg/dL — AB (ref 0.0–1.9)
Microalb Creat Ratio: 4.5 mg/g (ref 0.0–30.0)

## 2014-07-24 LAB — FRUCTOSAMINE: FRUCTOSAMINE: 220 umol/L (ref 190–270)

## 2014-07-26 ENCOUNTER — Other Ambulatory Visit: Payer: Self-pay | Admitting: Internal Medicine

## 2014-07-28 ENCOUNTER — Encounter: Payer: Self-pay | Admitting: Endocrinology

## 2014-07-28 ENCOUNTER — Ambulatory Visit (INDEPENDENT_AMBULATORY_CARE_PROVIDER_SITE_OTHER): Payer: 59 | Admitting: Endocrinology

## 2014-07-28 VITALS — BP 123/88 | HR 68 | Temp 98.3°F | Resp 16 | Ht 66.0 in | Wt 288.4 lb

## 2014-07-28 DIAGNOSIS — E1165 Type 2 diabetes mellitus with hyperglycemia: Secondary | ICD-10-CM

## 2014-07-28 DIAGNOSIS — E7800 Pure hypercholesterolemia, unspecified: Secondary | ICD-10-CM

## 2014-07-28 DIAGNOSIS — I10 Essential (primary) hypertension: Secondary | ICD-10-CM

## 2014-07-28 DIAGNOSIS — IMO0002 Reserved for concepts with insufficient information to code with codable children: Secondary | ICD-10-CM

## 2014-07-28 MED ORDER — SIMVASTATIN 20 MG PO TABS: 20.0000 mg | ORAL_TABLET | Freq: Every day | ORAL | Status: DC

## 2014-07-28 NOTE — Patient Instructions (Signed)
Please check blood sugars at least half the time about 2 hours after any meal and 2 times per week on waking up. Please bring blood sugar monitor to each visit  

## 2014-07-28 NOTE — Progress Notes (Signed)
Patient ID: Julia Estrada, female   DOB: 02/20/1971, 43 y.o.   MRN: [id number]           Reason for Appointment: f/u for Type 2 Diabetes  History of Present Illness:          Diagnosis: Type 2 diabetes mellitus, date of diagnosis: ?  2011        Past history:  Not clear when her diagnosis was first made but she probably did not have very high sugars. She was initially treated with metformin which was titrated to 2000 mg a day without side effects She was also sent to the diabetes classes at the time of diagnosis At some point she was also given Januvia which has been continued She was also tried on Victoza in 08/2012 but she could not tolerate it for more than a week because of significant nausea and vomiting.  She thinks she may have taken only the 0.6 mg dose Most of her A1c readings have been in the low 7+ range But in 2015 have been over 8%  Recent history:  Her previous regimen of Janumet XR was changed on her initial consultation to Glyxambi 10/5 and metformin 1 g twice a day She was also recommended consistent glucose monitoring especially after meals She has been a little more motivated and has been cutting out more high-fat foods especially in the evening. She was encouraged to exercise regularly Although she has not done many readings overall she has variable readings after breakfast and has only one reading after dinner Fasting reading was about 120 in the lab and overall blood sugars appear to be lower Fructosamine is excellent at 220 She does think she has more polyuria with starting Jardiance and has had one episode of  candidiasis       Oral hypoglycemic drugs the patient is taking are: Glyxambi, metformin ER 2 g     Side effects from medications have been: Vomiting with Victoza which she tried for one week only     Compliance with the medical regimen: Poor  Hypoglycemia:   none  Glucose monitoring:   regularly Glucometer: One Touch.      Blood Glucose readings  after  breakfast 113-205 with average 164 and after dinner 165  Self-care: The diet that the patient has been following is: none, does avoid regular soft drinks   Meals: 3 meals per day. Breakfast is oatmeal or biscuit,   lunch is chicken or pork chop, dinner is less fast food or pizza.  Has fruit cups chips or popcorn for snacks        Exercise: Has been walking up to 4 times a week, 30 min at a time Dietician visit, most recent: at diagnosis                Weight history: Previous range 276 -309  Wt Readings from Last 3 Encounters:  07/28/14 288 lb 6.4 oz (130.817 kg)  07/02/14 295 lb 9.6 oz (134.083 kg)  06/06/14 296 lb (134.265 kg)    Glycemic control:   Lab Results  Component Value Date   HGBA1C 9.2* 06/03/2014   HGBA1C 8.2* 02/05/2014   HGBA1C 8.2* 08/06/2013   Lab Results  Component Value Date   MICROALBUR 6.8* 07/22/2014   LDLCALC 139* 07/22/2014   CREATININE 0.7 07/22/2014         Medication List       This list is accurate as of: 07/28/14  3:51 PM.  Always use your  most recent med list.               diphenhydrAMINE 25 MG tablet  Commonly known as:  BENADRYL  Take 1 tablet (25 mg total) by mouth every 6 (six) hours as needed for itching or allergies (hives).     Empagliflozin-Linagliptin 10-5 MG Tabs  Commonly known as:  GLYXAMBI  Take 10 mg by mouth daily.     ferrous sulfate 325 (65 FE) MG tablet  Take 325 mg by mouth daily with breakfast.     fexofenadine 180 MG tablet  Commonly known as:  ALLEGRA  Take 180 mg by mouth daily.     fluconazole 150 MG tablet  Commonly known as:  DIFLUCAN  Take 1 tablet (150 mg total) by mouth once.     glucose blood test strip  Commonly known as:  ONE TOUCH ULTRA TEST  Use as instructed to check blood sugar 2 times per day dx code E11.65     ibuprofen 800 MG tablet  Commonly known as:  ADVIL,MOTRIN  Take 800 mg by mouth every 8 (eight) hours as needed for fever, headache or mild pain.     LO LOESTRIN FE 1 MG-10  MCG / 10 MCG tablet  Generic drug:  Norethindrone-Ethinyl Estradiol-Fe Biphas     metFORMIN 1000 MG (MOD) 24 hr tablet  Commonly known as:  GLUMETZA  Take 1 tablet (1,000 mg total) by mouth daily with breakfast.     ONETOUCH DELICA LANCETS FINE Misc  Use 2 per day dx code E11.65     pantoprazole 40 MG tablet  Commonly known as:  PROTONIX  Take 1 tablet by mouth  daily     ranitidine 300 MG tablet  Commonly known as:  ZANTAC  Take 1 tablet by mouth at  bedtime.     spironolactone 100 MG tablet  Commonly known as:  ALDACTONE  Take 1 tablet by mouth  daily     triamcinolone cream 0.5 %  Commonly known as:  KENALOG  Apply 1 application topically 3 (three) times daily. On rash     valACYclovir 1000 MG tablet  Commonly known as:  VALTREX  Take 1,000 mg by mouth daily.     VENTOLIN HFA 108 (90 BASE) MCG/ACT inhaler  Generic drug:  albuterol     vitamin B-12 1000 MCG tablet  Commonly known as:  CYANOCOBALAMIN  Take 1,000 mcg by mouth daily.        Allergies:  Allergies  Allergen Reactions  . Codeine Sulfate Nausea And Vomiting  . Hydrochlorothiazide W-Triamterene     REACTION: cramping  . Oxycodone-Acetaminophen Nausea And Vomiting  . Prednisone Hives  . Victoza [Liraglutide]     n/v    Past Medical History  Diagnosis Date  . Anemia     iron deficiency  . Obesity     BMI=44  . Vitamin B12 deficiency   . Vitamin D deficiency   . Asthma   . Sleep apnea     on CPAP  . Depression 2011  . Diabetes mellitus 2010    type II  . GERD (gastroesophageal reflux disease) 2004  . Hypertension 2008  . Shortness of breath     Past Surgical History  Procedure Laterality Date  . Fibroidectomy  2002  . Tonsillectomy and adenoidectomy    . Cesarean section    . Myomectomy    . Shoulder arthroscopy Left 04/30/2014    Procedure: Left Shoulder Arthroscopy and Debridement;  Surgeon: Berna SpareMarcus  Kandis Mannan, MD;  Location: [facility];  Service: Orthopedics;  Laterality: Left;     Family History  Problem Relation Age of Onset  . Hypertension Mother     father, sisters  . Diabetes Mother     sister  . Kidney cancer Mother   . Cancer Mother   . Cholelithiasis Sister   . Clotting disorder Daughter     Social History:  reports that she has never smoked. She has never used smokeless tobacco. She reports that she does not drink alcohol or use illicit drugs.    Review of Systems        Lipids:  Her LDL is somewhat worse and has a low HDL.  Has had LDL levels over 100 consistently She does have a family history of CAD      Lab Results  Component Value Date   CHOL 203* 07/22/2014   HDL 29.00* 07/22/2014   LDLCALC 139* 07/22/2014   LDLDIRECT 92.4 09/29/2008   TRIG 174.0* 07/22/2014   CHOLHDL 7 07/22/2014                  The blood pressure has been treated with Aldactone since 2008.  Continues to take 100 mg and potassium is stable       Physical Examination:  BP 123/88 mmHg  Pulse 68  Temp(Src) 98.3 F (36.8 C)  Resp 16  Ht 5\' 6"  (1.676 m)  Wt 288 lb 6.4 oz (130.817 kg)  BMI 46.57 kg/m2  SpO2 97%  LMP 06/02/2014      The  repeat blood pressure 122/80 with large cuff  ASSESSMENT:  Diabetes type 2, uncontrolled    She has somewhat better blood sugars at home with using Jardiance in addition to her Derrell Lolling and metformin Although she has done mostly readings after breakfast which can be sometimes higher fructosamine indicates overall better control She does have a little polyuria with Jardiance and one episode of candidiasis but is willing to continue this because of benefit she is getting She has also lost 7 pounds  History of hypertension: No change in blood pressure with starting Jardiance, also potassium stable with 100 mg Aldactone  LIPIDS: She has had  hypercholesterolemia but LDL is significantly high along with low HDL.   She also has other risk factors including hypertension and family history of CAD  Discussed cardiovascular  event prevention with statin drugs and she agrees to start.  Because of her relatively low HDL will start Zocor  PLAN:   No change in medications  Reduce fluid intake and caffeine in the evenings to reduce nocturia  Check blood sugars at various times of the day and some fasting readings  Zocor 20 mg daily  More consistently low saturated fat diet  Consistent exercise  Counseling time over 50% of today's 25 minute visit  Julia Estrada 07/28/2014, 3:51 PM   Note: This office note was prepared with Insurance underwriter. Any transcriptional errors that result from this process are unintentional.

## 2014-08-08 ENCOUNTER — Other Ambulatory Visit: Payer: Self-pay | Admitting: *Deleted

## 2014-08-08 ENCOUNTER — Telehealth: Payer: Self-pay | Admitting: Endocrinology

## 2014-08-08 MED ORDER — METFORMIN HCL ER (MOD) 1000 MG PO TB24: 1000.0000 mg | ORAL_TABLET | Freq: Two times a day (BID) | ORAL | Status: DC

## 2014-08-08 MED ORDER — LINAGLIPTIN 5 MG PO TABS: 5.0000 mg | ORAL_TABLET | Freq: Every day | ORAL | Status: DC

## 2014-08-08 MED ORDER — EMPAGLIFLOZIN 10 MG PO TABS: 10.0000 mg | ORAL_TABLET | Freq: Every day | ORAL | Status: DC

## 2014-08-08 NOTE — Telephone Encounter (Signed)
Please see below and advise.

## 2014-08-08 NOTE — Telephone Encounter (Signed)
Metformin er 1g bid, Tradgenta 5mg  qd and Jardiance 10mg  qd

## 2014-08-08 NOTE — Telephone Encounter (Signed)
Noted, rx sent, patient aware 

## 2014-08-08 NOTE — Telephone Encounter (Signed)
Patient states that her insurance company is not approving the Glyxambi and Glumetza  Was advised by her pharmacy to call our office for alternative medication    Please advise patient   Thank you

## 2014-08-11 ENCOUNTER — Telehealth: Payer: Self-pay | Admitting: Internal Medicine

## 2014-08-11 ENCOUNTER — Other Ambulatory Visit: Payer: Self-pay | Admitting: *Deleted

## 2014-08-11 MED ORDER — METFORMIN HCL ER 500 MG PO TB24: ORAL_TABLET | ORAL | Status: DC

## 2014-08-11 MED ORDER — METFORMIN HCL ER (MOD) 1000 MG PO TB24: 1000.0000 mg | ORAL_TABLET | Freq: Two times a day (BID) | ORAL | Status: DC

## 2014-08-11 NOTE — Telephone Encounter (Signed)
Patient states the pharmacy never received her metformin rx  Please resend   Thank you

## 2014-08-11 NOTE — Telephone Encounter (Signed)
Re-sent to pharmacy.

## 2014-08-14 ENCOUNTER — Encounter: Payer: Self-pay | Admitting: Dietician

## 2014-08-14 ENCOUNTER — Encounter: Payer: 59 | Attending: Endocrinology | Admitting: Dietician

## 2014-08-14 VITALS — Ht 66.0 in | Wt 285.5 lb

## 2014-08-14 DIAGNOSIS — Z713 Dietary counseling and surveillance: Secondary | ICD-10-CM | POA: Diagnosis not present

## 2014-08-14 DIAGNOSIS — E1165 Type 2 diabetes mellitus with hyperglycemia: Secondary | ICD-10-CM | POA: Insufficient documentation

## 2014-08-14 DIAGNOSIS — IMO0002 Reserved for concepts with insufficient information to code with codable children: Secondary | ICD-10-CM
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
## 2014-08-14 NOTE — Patient Instructions (Signed)
Limit meals from outside your home to 7 per week. Try putting half of your meal away in to-go box at the beginning of your meal.  Plan to exercise 3 x week. Aim to fill half of your plate with vegetables, limit starches to a quarter of your plate (size of the palm of your hand). When you go to eat, choose lean (not fried) protein. Have protein with carbs for snacks (see list). Think about using small plates to help with portions.  Check blood sugar 2 x day (fasting and 2 hours after a meal). Starting writing down blood sugar numbers in a journal.

## 2014-08-18 ENCOUNTER — Telehealth: Payer: Self-pay | Admitting: Internal Medicine

## 2014-08-18 NOTE — Telephone Encounter (Signed)
Pt called in just requesting a call back from nurse.  Wanted to talk about a med. All she would tell me

## 2014-08-19 MED ORDER — LORAZEPAM 0.5 MG PO TABS: 0.5000 mg | ORAL_TABLET | Freq: Three times a day (TID) | ORAL | Status: DC | PRN

## 2014-08-19 NOTE — Telephone Encounter (Signed)
Rx phoned in. Pt informed.

## 2014-08-19 NOTE — Telephone Encounter (Signed)
Pt c/o increased anxiety, jittery, fast heart rate and worrying about stuff. She has tried lorazepam 0.5 mg in the past. She is requesting new Rx. Please advise

## 2014-08-19 NOTE — Telephone Encounter (Signed)
Ok pls call in Thx 

## 2014-08-28 ENCOUNTER — Other Ambulatory Visit: Payer: Self-pay | Admitting: *Deleted

## 2014-08-28 MED ORDER — LINAGLIPTIN 5 MG PO TABS: 5.0000 mg | ORAL_TABLET | Freq: Every day | ORAL | Status: DC

## 2014-08-28 MED ORDER — METFORMIN HCL ER 500 MG PO TB24: ORAL_TABLET | ORAL | Status: DC

## 2014-08-28 MED ORDER — EMPAGLIFLOZIN 10 MG PO TABS: 10.0000 mg | ORAL_TABLET | Freq: Every day | ORAL | Status: DC

## 2014-09-09 ENCOUNTER — Encounter: Payer: Self-pay | Admitting: *Deleted

## 2014-09-09 LAB — HM DIABETES EYE EXAM

## 2014-09-24 ENCOUNTER — Other Ambulatory Visit (INDEPENDENT_AMBULATORY_CARE_PROVIDER_SITE_OTHER): Payer: 59

## 2014-09-24 DIAGNOSIS — E7800 Pure hypercholesterolemia, unspecified: Secondary | ICD-10-CM

## 2014-09-24 DIAGNOSIS — IMO0002 Reserved for concepts with insufficient information to code with codable children: Secondary | ICD-10-CM

## 2014-09-24 DIAGNOSIS — E1165 Type 2 diabetes mellitus with hyperglycemia: Secondary | ICD-10-CM

## 2014-09-24 LAB — COMPREHENSIVE METABOLIC PANEL
ALT: 15 U/L (ref 0–35)
AST: 10 U/L (ref 0–37)
Albumin: 3.8 g/dL (ref 3.5–5.2)
Alkaline Phosphatase: 131 U/L — ABNORMAL HIGH (ref 39–117)
BUN: 14 mg/dL (ref 6–23)
CO2: 26 mEq/L (ref 19–32)
Calcium: 9.4 mg/dL (ref 8.4–10.5)
Chloride: 102 mEq/L (ref 96–112)
Creatinine, Ser: 0.87 mg/dL (ref 0.40–1.20)
GFR: 91.19 mL/min (ref >60.00)
Glucose, Bld: 113 mg/dL — ABNORMAL HIGH (ref 70–99)
Potassium: 3.9 mEq/L (ref 3.5–5.1)
SODIUM: 135 meq/L (ref 135–145)
TOTAL PROTEIN: 7.6 g/dL (ref 6.0–8.3)
Total Bilirubin: 0.5 mg/dL (ref 0.2–1.2)

## 2014-09-24 LAB — LIPID PANEL
CHOLESTEROL: 152 mg/dL (ref 0–200)
HDL: 39.3 mg/dL (ref >39.00)
NonHDL: 112.7
TRIGLYCERIDES: 230 mg/dL — AB (ref 0.0–149.0)
Total CHOL/HDL Ratio: 4
VLDL: 46 mg/dL — AB (ref 0.0–40.0)

## 2014-09-24 LAB — HEMOGLOBIN A1C: Hgb A1c MFr Bld: 7.8 % — ABNORMAL HIGH (ref 4.6–6.5)

## 2014-09-24 LAB — LDL CHOLESTEROL, DIRECT: Direct LDL: 72 mg/dL

## 2014-09-29 ENCOUNTER — Encounter: Payer: Self-pay | Admitting: Endocrinology

## 2014-09-29 ENCOUNTER — Ambulatory Visit (INDEPENDENT_AMBULATORY_CARE_PROVIDER_SITE_OTHER): Payer: 59 | Admitting: Endocrinology

## 2014-09-29 VITALS — BP 110/72 | HR 77 | Temp 98.7°F | Wt 283.4 lb

## 2014-09-29 DIAGNOSIS — IMO0002 Reserved for concepts with insufficient information to code with codable children: Secondary | ICD-10-CM

## 2014-09-29 DIAGNOSIS — E1165 Type 2 diabetes mellitus with hyperglycemia: Secondary | ICD-10-CM

## 2014-09-29 DIAGNOSIS — E7800 Pure hypercholesterolemia, unspecified: Secondary | ICD-10-CM

## 2014-09-29 NOTE — Progress Notes (Signed)
Pre visit review using our clinic review tool, if applicable. No additional management support is needed unless otherwise documented below in the visit note. 

## 2014-09-29 NOTE — Progress Notes (Signed)
Patient ID: Julia Estrada, female   DOB: 01/05/1971, 44 y.o.   MRN: [id number]           Reason for Appointment: Follow-up for Type 2 Diabetes  History of Present Illness:          Diagnosis: Type 2 diabetes mellitus, date of diagnosis: ?  2011        Past history:  Not clear when her diagnosis was first made but she probably did not have very high sugars. She was initially treated with metformin which was titrated to 2000 mg a day without side effects She was also sent to the diabetes classes at the time of diagnosis At some point she was also given Januvia which has been continued She was also tried on Victoza in 08/2012 but she could not tolerate it for more than a week because of significant nausea and vomiting.  She thinks she may have taken only the 0.6 mg dose Most of her A1c readings have been in the low 7+ range But in 2015 have been over 8%  Recent history:  Her previous regimen of Janumet XR was changed on her initial consultation to Glyxambi 10/5 and metformin 1 g twice a day Because of insurance preference Glyxambi was changed to [place]genta and Jardiance 10 mg She was also given basic education on day-to-day management of her diabetes including lifestyle changes and glucose monitoring She did have some increased frequency of urination with Jardiance but this is better now; however has had candidiasis twice requiring Diflucan. Again she has not checked her sugars as directed and has only a handful of readings She did have a couple of slightly high readings after breakfast from forgetting to take her morning medication A1c has improved although still relatively high at 7.8       Oral hypoglycemic drugs the patient is taking are: Kenton KingfisherJardiance, Tradgenta, metformin ER 2 g     Side effects from medications have been: Vomiting with Victoza which she tried for one week only     Compliance with the medical regimen: Improved  Hypoglycemia:   none  Glucose monitoring:   regularly  Glucometer: One Touch.      Blood Glucose readings  fasting 120-135 and after breakfast 137-206.  After lunch 114  Self-care: The diet that the patient has been following is: none, does avoid sweets   Meals: 3 meals per day. Breakfast is oatmeal or biscuit,   lunch is chicken or pork chop  Has fruit cups chips or popcorn for snacks        Exercise: Has been walking less recently, 30 min at a time Dietician visit, most recent: 1/16              Weight history: Previous range 276 -309  Wt Readings from Last 3 Encounters:  09/29/14 283 lb 6.4 oz (128.549 kg)  08/14/14 285 lb 8 oz (129.502 kg)  07/28/14 288 lb 6.4 oz (130.817 kg)    Glycemic control:   Lab Results  Component Value Date   HGBA1C 7.8* 09/24/2014   HGBA1C 9.2* 06/03/2014   HGBA1C 8.2* 02/05/2014   Lab Results  Component Value Date   MICROALBUR 6.8* 07/22/2014   LDLCALC 139* 07/22/2014   CREATININE 0.87 09/24/2014         Medication List       This list is accurate as of: 09/29/14  3:37 PM.  Always use your most recent med list.  diphenhydrAMINE 25 MG tablet  Commonly known as:  BENADRYL  Take 1 tablet (25 mg total) by mouth every 6 (six) hours as needed for itching or allergies (hives).     empagliflozin 10 MG Tabs tablet  Commonly known as:  JARDIANCE  Take 10 mg by mouth daily.     Empagliflozin-Linagliptin 10-5 MG Tabs  Commonly known as:  GLYXAMBI  Take 10 mg by mouth daily.     ferrous sulfate 325 (65 FE) MG tablet  Take 325 mg by mouth daily with breakfast.     fexofenadine 180 MG tablet  Commonly known as:  ALLEGRA  Take 180 mg by mouth daily.     fluconazole 150 MG tablet  Commonly known as:  DIFLUCAN  Take 1 tablet (150 mg total) by mouth once.     glucose blood test strip  Commonly known as:  ONE TOUCH ULTRA TEST  Use as instructed to check blood sugar 2 times per day dx code E11.65     ibuprofen 800 MG tablet  Commonly known as:  ADVIL,MOTRIN  Take 800 mg by  mouth every 8 (eight) hours as needed for fever, headache or mild pain.     linagliptin 5 MG Tabs tablet  Commonly known as:  TRADJENTA  Take 1 tablet (5 mg total) by mouth daily.     LO LOESTRIN FE 1 MG-10 MCG / 10 MCG tablet  Generic drug:  Norethindrone-Ethinyl Estradiol-Fe Biphas     LORazepam 0.5 MG tablet  Commonly known as:  ATIVAN  Take 1 tablet (0.5 mg total) by mouth every 8 (eight) hours as needed for anxiety.     metFORMIN 500 MG 24 hr tablet  Commonly known as:  GLUCOPHAGE XR  Take 4 tablets daily     ONETOUCH DELICA LANCETS FINE Misc  Use 2 per day dx code E11.65     pantoprazole 40 MG tablet  Commonly known as:  PROTONIX  Take 1 tablet by mouth  daily     ranitidine 300 MG tablet  Commonly known as:  ZANTAC  Take 1 tablet by mouth at  bedtime.     simvastatin 20 MG tablet  Commonly known as:  ZOCOR  Take 1 tablet (20 mg total) by mouth at bedtime.     spironolactone 100 MG tablet  Commonly known as:  ALDACTONE  Take 1 tablet by mouth  daily     triamcinolone cream 0.5 %  Commonly known as:  KENALOG  Apply 1 application topically 3 (three) times daily. On rash     valACYclovir 1000 MG tablet  Commonly known as:  VALTREX  Take 1,000 mg by mouth daily.     VENTOLIN HFA 108 (90 BASE) MCG/ACT inhaler  Generic drug:  albuterol     vitamin B-12 1000 MCG tablet  Commonly known as:  CYANOCOBALAMIN  Take 1,000 mcg by mouth daily.        Allergies:  Allergies  Allergen Reactions  . Codeine Sulfate Nausea And Vomiting  . Hydrochlorothiazide W-Triamterene     REACTION: cramping  . Oxycodone-Acetaminophen Nausea And Vomiting  . Prednisone Hives  . Victoza [Liraglutide]     n/v    Past Medical History  Diagnosis Date  . Anemia     iron deficiency  . Obesity     BMI=44  . Vitamin B12 deficiency   . Vitamin D deficiency   . Asthma   . Sleep apnea     on CPAP  . Depression 2011  .  Diabetes mellitus 2010    type II  . GERD (gastroesophageal  reflux disease) 2004  . Hypertension 2008  . Shortness of breath     Past Surgical History  Procedure Laterality Date  . Fibroidectomy  2002  . Tonsillectomy and adenoidectomy    . Cesarean section    . Myomectomy    . Shoulder arthroscopy Left 04/30/2014    Procedure: Left Shoulder Arthroscopy and Debridement;  Surgeon: Nadara Mustard, MD;  Location: [facility] OR;  Service: Orthopedics;  Laterality: Left;    Family History  Problem Relation Age of Onset  . Hypertension Mother     father, sisters  . Diabetes Mother     sister  . Kidney cancer Mother   . Cancer Mother   . Cholelithiasis Sister   . Clotting disorder Daughter   . Heart disease Maternal Uncle   . Heart disease Maternal Grandfather     Social History:  reports that she has never smoked. She has never used smokeless tobacco. She reports that she does not drink alcohol or use illicit drugs.    Review of Systems        Lipids:  Her LDL is much better controlled with starting Zocor  Has had LDL levels over 100 consistently previously She does have a family history of CAD      Lab Results  Component Value Date   CHOL 152 09/24/2014   HDL 39.30 09/24/2014   LDLCALC 139* 07/22/2014   LDLDIRECT 72.0 09/24/2014   TRIG 230.0* 09/24/2014   CHOLHDL 4 09/24/2014                  The blood pressure has been treated with Aldactone since 2008.   Continues to take 100 mg and potassium is stable       Physical Examination:  BP 110/72 mmHg  Pulse 77  Temp(Src) 98.7 F (37.1 C) (Oral)  Wt 283 lb 6.4 oz (128.549 kg)  SpO2 95%        ASSESSMENT:  Diabetes type 2, uncontrolled    She has fair control of her blood sugars with A1c now 7.8% Although her home blood sugars are fairly good fasting and also has good readings after meals if she is compliant with her medications she has not done many readings including after supper A1c may improve further with continued improved control and use of Jardiance She does need to  exercise somewhat more regularly Although she did have candidiasis with Jardiance twice she is able to treat this easily and will continue this since she has benefited significantly Has lost another 2 pounds  LIPIDS: She has had  hypercholesterolemia which is well-controlled with Zocor now   PLAN:   No change in medications  Check blood sugars at various times of the day and some fasting readings  More consistent regimen of medications in the mornings and also try to be consistent with diet  Consistent exercise   [facility] 09/29/2014, 3:37 PM   Note: This office note was prepared with Dragon voice recognition system technology. Any transcriptional errors that result from this process are unintentional.

## 2014-09-29 NOTE — Patient Instructions (Signed)
Please check blood sugars at least half the time about 2 hours after any meal especially dinner and 3 times per week on waking up.  Please bring blood sugar monitor to each visit. Recommended blood sugar levels about 2 hours after meal is 140-180 and on waking up 90-130  Walk daily

## 2014-10-03 ENCOUNTER — Ambulatory Visit (INDEPENDENT_AMBULATORY_CARE_PROVIDER_SITE_OTHER): Payer: 59 | Admitting: Internal Medicine

## 2014-10-03 VITALS — BP 118/80 | HR 87 | Temp 98.6°F | Wt 284.5 lb

## 2014-10-03 DIAGNOSIS — E1165 Type 2 diabetes mellitus with hyperglycemia: Secondary | ICD-10-CM

## 2014-10-03 DIAGNOSIS — IMO0002 Reserved for concepts with insufficient information to code with codable children: Secondary | ICD-10-CM

## 2014-10-03 DIAGNOSIS — Z23 Encounter for immunization: Secondary | ICD-10-CM

## 2014-10-03 DIAGNOSIS — E559 Vitamin D deficiency, unspecified: Secondary | ICD-10-CM

## 2014-10-03 DIAGNOSIS — E785 Hyperlipidemia, unspecified: Secondary | ICD-10-CM | POA: Insufficient documentation

## 2014-10-03 DIAGNOSIS — E538 Deficiency of other specified B group vitamins: Secondary | ICD-10-CM

## 2014-10-03 NOTE — Assessment & Plan Note (Signed)
On Simvastatin 
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
## 2014-10-03 NOTE — Assessment & Plan Note (Signed)
Doing better Cont w/Metformin, Trajenta, Safeco Corporationardiance

## 2014-10-03 NOTE — Progress Notes (Signed)
Pre visit review using our clinic review tool, if applicable. No additional management support is needed unless otherwise documented below in the visit note. 

## 2014-10-03 NOTE — Assessment & Plan Note (Signed)
No extra supplementation (it was high in 2015)

## 2014-10-03 NOTE — Assessment & Plan Note (Signed)
Cont w/Vit B12 

## 2014-10-07 ENCOUNTER — Ambulatory Visit: Payer: 59 | Admitting: Dietician

## 2014-11-11 ENCOUNTER — Encounter (HOSPITAL_COMMUNITY): Payer: Self-pay | Admitting: Emergency Medicine

## 2014-11-11 ENCOUNTER — Emergency Department (INDEPENDENT_AMBULATORY_CARE_PROVIDER_SITE_OTHER)
Admission: EM | Admit: 2014-11-11 | Discharge: 2014-11-11 | Disposition: A | Discharge status: Home / Self Care | Payer: 59 | Source: Home / Self Care | Attending: Family Medicine | Admitting: Family Medicine

## 2014-11-11 DIAGNOSIS — L03012 Cellulitis of left finger: Secondary | ICD-10-CM

## 2014-11-11 DIAGNOSIS — Z23 Encounter for immunization: Secondary | ICD-10-CM | POA: Diagnosis not present

## 2014-11-11 MED ORDER — CLINDAMYCIN HCL 300 MG PO CAPS: 300.0000 mg | ORAL_CAPSULE | Freq: Four times a day (QID) | ORAL | Status: DC

## 2014-11-11 MED ORDER — TETANUS-DIPHTH-ACELL PERTUSSIS 5-2.5-18.5 LF-MCG/0.5 IM SUSP
INTRAMUSCULAR | Status: AC
  Filled 2014-11-11: qty 0.5

## 2014-11-11 MED ORDER — TETANUS-DIPHTH-ACELL PERTUSSIS 5-2.5-18.5 LF-MCG/0.5 IM SUSP
0.5000 mL | Freq: Once | INTRAMUSCULAR | Status: AC
  Administered 2014-11-11: 0.5 mL via INTRAMUSCULAR

## 2014-11-11 NOTE — ED Provider Notes (Signed)
CSN: [id number]     Arrival date & time 11/11/14  1805 History   First MD Initiated Contact with Patient 11/11/14 1933     Chief Complaint  Patient presents with  . Hand Pain   (Consider location/radiation/quality/duration/timing/severity/associated sxs/prior Treatment) HPI      44 year old female presents complaining of pain and swelling in her left middle finger around the lateral portion of the nail for one week. This is gotten gradually worse. She has had a similar infection in the past that spread up her arm and she ended up having to get IV antibiotics. She denies any systemic symptoms. The pain is localized to the tip of her left middle finger. There is been no drainage or numbness in the finger.  Past Medical History  Diagnosis Date  . Anemia     iron deficiency  . Obesity     BMI=44  . Vitamin B12 deficiency   . Vitamin D deficiency   . Asthma   . Sleep apnea     on CPAP  . Depression 2011  . Diabetes mellitus 2010    type II  . GERD (gastroesophageal reflux disease) 2004  . Hypertension 2008  . Shortness of breath    Past Surgical History  Procedure Laterality Date  . Fibroidectomy  2002  . Tonsillectomy and adenoidectomy    . Cesarean section    . Myomectomy    . Shoulder arthroscopy Left 04/30/2014    Procedure: Left Shoulder Arthroscopy and Debridement;  Surgeon: Nadara Mustard, MD;  Location: [facility] OR;  Service: Orthopedics;  Laterality: Left;   Family History  Problem Relation Age of Onset  . Hypertension Mother     father, sisters  . Diabetes Mother     sister  . Kidney cancer Mother   . Cancer Mother   . Cholelithiasis Sister   . Clotting disorder Daughter   . Heart disease Maternal Uncle   . Heart disease Maternal Grandfather    History  Substance Use Topics  . Smoking status: Never Smoker   . Smokeless tobacco: Never Used  . Alcohol Use: No   OB History    No data available     Review of Systems  Musculoskeletal:       Left middle finger  distal pain and swelling  All other systems reviewed and are negative.   Allergies  Codeine sulfate; Hydrochlorothiazide w-triamterene; Oxycodone-acetaminophen; Prednisone; and Victoza  Home Medications   Prior to Admission medications   Medication Sig Start Date End Date Taking? Authorizing Provider  clindamycin (CLEOCIN) 300 MG capsule Take 1 capsule (300 mg total) by mouth 4 (four) times daily. 11/11/14   Graylon Good, PA-C  diphenhydrAMINE (BENADRYL) 25 MG tablet Take 1 tablet (25 mg total) by mouth every 6 (six) hours as needed for itching or allergies (hives). Patient not taking: Reported on 10/03/2014 03/31/14   Jacinta Shoe V, MD  empagliflozin (JARDIANCE) 10 MG TABS tablet Take 10 mg by mouth daily. 08/28/14   Reather Littler, MD  ferrous sulfate 325 (65 FE) MG tablet Take 325 mg by mouth daily with breakfast.    Historical Provider, MD  fexofenadine (ALLEGRA) 180 MG tablet Take 180 mg by mouth daily.    Historical Provider, MD  fluconazole (DIFLUCAN) 150 MG tablet Take 1 tablet (150 mg total) by mouth once. 07/18/14   Reather Littler, MD  glucose blood (ONE TOUCH ULTRA TEST) test strip Use as instructed to check blood sugar 2 times per day dx code  E11.65 07/02/14   Reather LittlerAjay Kumar, MD  ibuprofen (ADVIL,MOTRIN) 800 MG tablet Take 800 mg by mouth every 8 (eight) hours as needed for fever, headache or mild pain.     Historical Provider, MD  linagliptin (TRADJENTA) 5 MG TABS tablet Take 1 tablet (5 mg total) by mouth daily. 08/28/14   Reather LittlerAjay Kumar, MD  LO LOESTRIN FE 1 MG-10 MCG / 10 MCG tablet  07/04/14   Historical Provider, MD  LORazepam (ATIVAN) 0.5 MG tablet Take 1 tablet (0.5 mg total) by mouth every 8 (eight) hours as needed for anxiety. 08/19/14   Aleksei Plotnikov V, MD  metFORMIN (GLUCOPHAGE XR) 500 MG 24 hr tablet Take 4 tablets daily 08/28/14   Reather LittlerAjay Kumar, MD  [facility] DELICA LANCETS FINE MISC Use 2 per day dx code E11.65 07/08/14   Reather LittlerAjay Kumar, MD  pantoprazole (PROTONIX) 40 MG tablet Take 1  tablet by mouth  daily 07/28/14   Tresa GarterAleksei Plotnikov V, MD  ranitidine (ZANTAC) 300 MG tablet Take 1 tablet by mouth at  bedtime. 04/28/14   Aleksei Plotnikov V, MD  simvastatin (ZOCOR) 20 MG tablet Take 1 tablet (20 mg total) by mouth at bedtime. 07/28/14   Reather LittlerAjay Kumar, MD  spironolactone (ALDACTONE) 100 MG tablet Take 1 tablet by mouth  daily 04/28/14   Jacinta ShoeAleksei Plotnikov V, MD  triamcinolone cream (KENALOG) 0.5 % Apply 1 application topically 3 (three) times daily. On rash Patient taking differently: Apply 1 application topically 3 (three) times daily as needed (rash). On rash 03/31/14   Jacinta ShoeAleksei Plotnikov V, MD  valACYclovir (VALTREX) 1000 MG tablet Take 1,000 mg by mouth daily.  02/02/14   Historical Provider, MD  VENTOLIN HFA 108 (90 BASE) MCG/ACT inhaler  04/25/14   Historical Provider, MD  vitamin B-12 (CYANOCOBALAMIN) 1000 MCG tablet Take 1,000 mcg by mouth daily.    Historical Provider, MD   BP 136/83 mmHg  Pulse 79  Temp(Src) 98.4 F (36.9 C)  Resp 12  SpO2 99%  LMP 10/21/2014 (Approximate) Physical Exam  Constitutional: She is oriented to person, place, and time. Vital signs are normal. She appears well-developed and well-nourished. No distress.  HENT:  Head: Normocephalic and atraumatic.  Pulmonary/Chest: Effort normal. No respiratory distress.  Musculoskeletal:       Left hand: She exhibits tenderness and swelling (tenderness and swelling of the left medial nail fold with no pain or swelling in the fat pad of the finger).  Neurological: She is alert and oriented to person, place, and time. She has normal strength. Coordination normal.  Skin: Skin is warm and dry. No rash noted. She is not diaphoretic.  Psychiatric: She has a normal mood and affect. Judgment normal.  Nursing note and vitals reviewed.   ED Course  Procedures (including critical care time) Labs Review Labs Reviewed - No data to display  Imaging Review No results found.   MDM   1. Paronychia, left   2.  Cellulitis of finger of left hand    The finger was anesthetized with the digital block with 4 mL of 2% lidocaine without epinephrine with a single volar injection. The finger was cleansed with Betadine swab. Incision was made into the lateral nail fold and there was bleeding but no purulent drainage. A lateral incision was then made into the pad of the finger to attempt to evacuate any felon or pulp abscess, again there was bleeding but no purulence. Given this, the swelling is most likely isolated cellulitis. She by 2 females, will start her  on clindamycin to cover skin as well as mouth flora. Ibuprofen or Tylenol for pain. Warm soaks 3 times daily. Follow-up with hand surgeon if she is not getting better in a few days  Meds ordered this encounter  Medications  . Tdap (BOOSTRIX) injection 0.5 mL    Sig:   . clindamycin (CLEOCIN) 300 MG capsule    Sig: Take 1 capsule (300 mg total) by mouth 4 (four) times daily.    Dispense:  28 capsule    Refill:  0       Graylon Good, PA-C 11/11/14 2007

## 2014-11-11 NOTE — ED Notes (Signed)
The pt has had pain and swelling on her left middle finger around the nail for one week.  She denies any injury to the finger or recent manicures.

## 2014-11-11 NOTE — Discharge Instructions (Signed)
Fingertip Infection °When an infection is around the nail, it is called a paronychia. When it appears over the tip of the finger, it is called a felon. These infections are due to minor injuries or cracks in the skin. If they are not treated properly, they can lead to bone infection and permanent damage to the fingernail. °Incision and drainage is necessary if a pus pocket (an abscess) has formed. Antibiotics and pain medicine may also be needed. Keep your hand elevated for the next 2-3 days to reduce swelling and pain. If a pack was placed in the abscess, it should be removed in 1-2 days by your caregiver. Soak the finger in warm water for 20 minutes 4 times daily to help promote drainage. °Keep the hands as dry as possible. Wear protective gloves with cotton liners. See your caregiver for follow-up care as recommended.  °HOME CARE INSTRUCTIONS  °· Keep wound clean, dry and dressed as suggested by your caregiver. °· Soak in warm salt water for fifteen minutes, four times per day for bacterial infections. °· Your caregiver will prescribe an antibiotic if a bacterial infection is suspected. Take antibiotics as directed and finish the prescription, even if the problem appears to be improving before the medicine is gone. °· Only take over-the-counter or prescription medicines for pain, discomfort, or fever as directed by your caregiver. °SEEK IMMEDIATE MEDICAL CARE IF: °· There is redness, swelling, or increasing pain in the wound. °· Pus or any other unusual drainage is coming from the wound. °· An unexplained oral temperature above 102° F (38.9° C) develops. °· You notice a foul smell coming from the wound or dressing. °MAKE SURE YOU:  °· Understand these instructions. °· Monitor your condition. °· Contact your caregiver if you are getting worse or not improving. °Document Released: 08/25/2004 Document Revised: 10/10/2011 Document Reviewed: 08/21/2008 °Exit[employer]® Patient Information ©2015 [employer]. This  information is not intended to replace advice given to you by your health care provider. Make sure you discuss any questions you have with your health care provider. ° °

## 2014-11-12 ENCOUNTER — Ambulatory Visit: Payer: 59 | Admitting: Dietician

## 2014-12-17 ENCOUNTER — Other Ambulatory Visit: Payer: Self-pay | Admitting: Obstetrics and Gynecology

## 2014-12-24 ENCOUNTER — Other Ambulatory Visit (INDEPENDENT_AMBULATORY_CARE_PROVIDER_SITE_OTHER): Payer: 59

## 2014-12-24 DIAGNOSIS — E1165 Type 2 diabetes mellitus with hyperglycemia: Secondary | ICD-10-CM

## 2014-12-24 DIAGNOSIS — IMO0002 Reserved for concepts with insufficient information to code with codable children: Secondary | ICD-10-CM

## 2014-12-24 LAB — MICROALBUMIN / CREATININE URINE RATIO
CREATININE, U: 168 mg/dL
Microalb Creat Ratio: 1 mg/g (ref 0.0–30.0)
Microalb, Ur: 1.7 mg/dL (ref 0.0–1.9)

## 2014-12-24 LAB — URINALYSIS, ROUTINE W REFLEX MICROSCOPIC
BILIRUBIN URINE: NEGATIVE
Ketones, ur: NEGATIVE
Leukocytes, UA: NEGATIVE
NITRITE: NEGATIVE
Specific Gravity, Urine: 1.025 (ref 1.000–1.030)
Total Protein, Urine: NEGATIVE
Urobilinogen, UA: 0.2 (ref 0.0–1.0)
pH: 6 (ref 5.0–8.0)

## 2014-12-24 LAB — COMPREHENSIVE METABOLIC PANEL
ALBUMIN: 3.6 g/dL (ref 3.5–5.2)
ALK PHOS: 113 U/L (ref 39–117)
ALT: 18 U/L (ref 0–35)
AST: 11 U/L (ref 0–37)
BILIRUBIN TOTAL: 0.7 mg/dL (ref 0.2–1.2)
BUN: 10 mg/dL (ref 6–23)
CALCIUM: 9.5 mg/dL (ref 8.4–10.5)
CO2: 27 meq/L (ref 19–32)
CREATININE: 0.7 mg/dL (ref 0.40–1.20)
Chloride: 101 mEq/L (ref 96–112)
GFR: 117.06 mL/min (ref >60.00)
Glucose, Bld: 110 mg/dL — ABNORMAL HIGH (ref 70–99)
Potassium: 3.9 mEq/L (ref 3.5–5.1)
Sodium: 136 mEq/L (ref 135–145)
Total Protein: 7.3 g/dL (ref 6.0–8.3)

## 2014-12-24 LAB — HEMOGLOBIN A1C: Hgb A1c MFr Bld: 7.4 % — ABNORMAL HIGH (ref 4.6–6.5)

## 2014-12-25 ENCOUNTER — Encounter (HOSPITAL_COMMUNITY): Payer: Self-pay

## 2014-12-25 ENCOUNTER — Encounter (HOSPITAL_COMMUNITY)
Admission: RE | Admit: 2014-12-25 | Discharge: 2014-12-25 | Disposition: A | Discharge status: Home / Self Care | Payer: 59 | Source: Ambulatory Visit | Attending: Obstetrics and Gynecology | Admitting: Obstetrics and Gynecology

## 2014-12-25 DIAGNOSIS — Z01818 Encounter for other preprocedural examination: Secondary | ICD-10-CM | POA: Insufficient documentation

## 2014-12-25 HISTORY — DX: Anxiety disorder, unspecified: F41.9

## 2014-12-25 HISTORY — DX: Hyperlipidemia, unspecified: E78.5

## 2014-12-25 LAB — CBC
HEMATOCRIT: 29.7 % — AB (ref 36.0–46.0)
Hemoglobin: 9.2 g/dL — ABNORMAL LOW (ref 12.0–15.0)
MCH: 22.1 pg — AB (ref 26.0–34.0)
MCHC: 31 g/dL (ref 30.0–36.0)
MCV: 71.2 fL — ABNORMAL LOW (ref 78.0–100.0)
PLATELETS: 357 10*3/uL (ref 150–400)
RBC: 4.17 MIL/uL (ref 3.87–5.11)
RDW: 18.1 % — ABNORMAL HIGH (ref 11.5–15.5)
WBC: 8.2 10*3/uL (ref 4.0–10.5)

## 2014-12-25 NOTE — Pre-Procedure Instructions (Signed)
Patient had CMP done at PCP office on 12/24/14.  Results placed in patient's chart.

## 2014-12-25 NOTE — Patient Instructions (Addendum)
   Your procedure is scheduled on: Friday, June 10  [facility] at: 10:30 AM Pick up the phone at the desk and dial [phone] and inform us of your arrival.  Please call this number if you have any problems the morning of surgery: [phone]  Remember: Do not eat food after midnight: Thursday Do not drink clear liquids after: 8 AM Friday, day of surgery Take these medicines the morning of surgery with a SIP OF WATER: protonix, spironolactone, ORTHO-CYCLEN, valtrex and ativan if needed.  Patient instructed to withhold metformin Thursday's evening dose and not to take any diabetes medications on Friday, day of surgery.  We will check your sugar upon arrival to Short Stay Dept.   TDo not wear jewelry, make-up, or FINGER nail polish No metal in your hair or on your body. Do not wear lotions, powders, perfumes.  You may wear deodorant.  Do not bring valuables to the hospital. Contacts may not be worn into surgery.  Patients discharged on the day of surgery will not be allowed to drive home.  Home with [place] Rud[place] cell [phone]

## 2014-12-31 ENCOUNTER — Ambulatory Visit (INDEPENDENT_AMBULATORY_CARE_PROVIDER_SITE_OTHER): Payer: 59 | Admitting: Endocrinology

## 2014-12-31 ENCOUNTER — Encounter: Payer: Self-pay | Admitting: Endocrinology

## 2014-12-31 VITALS — BP 110/78 | HR 78 | Temp 97.7°F | Resp 16 | Ht 66.0 in | Wt 276.0 lb

## 2014-12-31 DIAGNOSIS — E1165 Type 2 diabetes mellitus with hyperglycemia: Secondary | ICD-10-CM | POA: Diagnosis not present

## 2014-12-31 DIAGNOSIS — IMO0002 Reserved for concepts with insufficient information to code with codable children: Secondary | ICD-10-CM

## 2014-12-31 LAB — HM MAMMOGRAPHY

## 2014-12-31 NOTE — Progress Notes (Signed)
Patient ID: Julia Estrada, female   DOB: Aug 31, 1970, 44 y.o.   MRN: [id number]           Reason for Appointment: Follow-up for Type 2 Diabetes  History of Present Illness:          Diagnosis: Type 2 diabetes mellitus, date of diagnosis: ?  2011        Past history:  Not clear when her diagnosis was first made but she probably did not have very high sugars. She was initially treated with metformin which was titrated to 2000 mg a day without side effects She was also sent to the diabetes classes at the time of diagnosis At some point she was also given Januvia which has been continued She was also tried on Victoza in 08/2012 but she could not tolerate it for more than a week because of significant nausea and vomiting.  She thinks she may have taken only the 0.6 mg dose Most of her A1c readings have been in the low 7+ range But in 2015 have been over 8%  Recent history:  Her previous regimen of Janumet XR was changed on her initial consultation to Glyxambi 10/5 and metformin 1 g twice a day Because of insurance preference Glyxambi was changed to [place] and Jardiance 10 mg Her A1c has progressively improved She is tolerating all her medications currently and having only mild easily treated candidiasis with Jardiance  She is now using a glucose monitor provided by her work in a study which is linked to her I- phone and does not have any written record or inflammation that can be downloaded.  Blood sugars are reviewed from her phone application She has also been somewhat more motivated to check the blood sugars more often than before as well as trying to walk more regularly She has improved her diet somewhat and is finally starting to lose some weight Hyperglycemia: Present mostly after supper A1c has improved although still relatively high at 7.4       Oral hypoglycemic drugs the patient is taking are: Kenton Kingfisher, metformin ER 2 g     Side effects from medications have been:  Vomiting with Victoza which she tried for one week only     Compliance with the medical regimen: Improved  Hypoglycemia:   none  Glucose monitoring:  Done about once or twice a day Glucometer:  ihealth     Blood Glucose readings for the last 30 days  Mean values apply above for all meters except median for One Touch  PRE-MEAL Fasting Lunch Dinner Bedtime Overall  Glucose range: 93-139    56-240  Mean/median: 118    143   POST-MEAL PC Breakfast PC Lunch PC Dinner  Glucose range:   highest 188  up to 208  Mean/median: 142 159 173     Self-care: The diet that the patient has been following is: none, does avoid sweets, may not always control portions or high-fat foods Meals: 3 meals per day. Breakfast is oatmeal or biscuit,   lunch is chicken or pork chop  Has fruit cups chips or popcorn for snacks        Exercise: Has been walking 2-3/7 recently, 30 min at a time Dietician visit, most recent: 1/16              Weight history: Previous range 276 -309  Wt Readings from Last 3 Encounters:  12/31/14 276 lb (125.193 kg)  10/03/14 284 lb 8 oz (129.048 kg)  09/29/14  283 lb 6.4 oz (128.549 kg)    Glycemic control:   Lab Results  Component Value Date   HGBA1C 7.4* 12/24/2014   HGBA1C 7.8* 09/24/2014   HGBA1C 9.2* 06/03/2014   Lab Results  Component Value Date   MICROALBUR 1.7 12/24/2014   LDLCALC 139* 07/22/2014   CREATININE 0.70 12/24/2014         Medication List       This list is accurate as of: 12/31/14  3:58 PM.  Always use your most recent med list.               clindamycin 300 MG capsule  Commonly known as:  CLEOCIN  Take 1 capsule (300 mg total) by mouth 4 (four) times daily.     diphenhydrAMINE 25 MG tablet  Commonly known as:  BENADRYL  Take 1 tablet (25 mg total) by mouth every 6 (six) hours as needed for itching or allergies (hives).     empagliflozin 10 MG Tabs tablet  Commonly known as:  JARDIANCE  Take 10 mg by mouth daily.     ferrous  sulfate 325 (65 FE) MG tablet  Take 325 mg by mouth daily with breakfast.     fexofenadine 180 MG tablet  Commonly known as:  ALLEGRA  Take 180 mg by mouth daily as needed for allergies.     fluconazole 150 MG tablet  Commonly known as:  DIFLUCAN  Take 1 tablet (150 mg total) by mouth once.     glucose blood test strip  Commonly known as:  ONE TOUCH ULTRA TEST  Use as instructed to check blood sugar 2 times per day dx code E11.65     ibuprofen 800 MG tablet  Commonly known as:  ADVIL,MOTRIN  Take 800 mg by mouth every 8 (eight) hours as needed for fever, headache or mild pain.     linagliptin 5 MG Tabs tablet  Commonly known as:  TRADJENTA  Take 1 tablet (5 mg total) by mouth daily.     LORazepam 0.5 MG tablet  Commonly known as:  ATIVAN  Take 1 tablet (0.5 mg total) by mouth every 8 (eight) hours as needed for anxiety.     metFORMIN 500 MG 24 hr tablet  Commonly known as:  GLUCOPHAGE XR  Take 4 tablets daily     norgestimate-ethinyl estradiol 0.25-35 MG-MCG tablet  Commonly known as:  ORTHO-CYCLEN,SPRINTEC,PREVIFEM  Take 1 tablet by mouth daily.     ONETOUCH DELICA LANCETS FINE Misc  Use 2 per day dx code E11.65     pantoprazole 40 MG tablet  Commonly known as:  PROTONIX  Take 1 tablet by mouth  daily     ranitidine 300 MG tablet  Commonly known as:  ZANTAC  Take 1 tablet by mouth at  bedtime.     simvastatin 20 MG tablet  Commonly known as:  ZOCOR  Take 1 tablet (20 mg total) by mouth at bedtime.     spironolactone 100 MG tablet  Commonly known as:  ALDACTONE  Take 1 tablet by mouth  daily     triamcinolone cream 0.5 %  Commonly known as:  KENALOG  Apply 1 application topically 3 (three) times daily. On rash     valACYclovir 1000 MG tablet  Commonly known as:  VALTREX  Take 1,000 mg by mouth daily.     vitamin B-12 1000 MCG tablet  Commonly known as:  CYANOCOBALAMIN  Take 1,000 mcg by mouth daily.  Allergies:  Allergies  Allergen  Reactions  . Codeine Sulfate Nausea And Vomiting  . Hydrochlorothiazide W-Triamterene     REACTION: cramping  . Oxycodone-Acetaminophen Nausea And Vomiting  . Prednisone Hives  . Victoza [Liraglutide]     n/v    Past Medical History  Diagnosis Date  . Anemia     iron deficiency  . Obesity     BMI=44  . Vitamin B12 deficiency   . Vitamin D deficiency   . Depression 2011  . Diabetes mellitus 2010    type II  . GERD (gastroesophageal reflux disease) 2004  . Hypertension 2008  . Hyperlipidemia   . Shortness of breath     with exercise  . Anxiety   . Sleep apnea     uses CPAP every night    Past Surgical History  Procedure Laterality Date  . Fibroidectomy  2002    laparotomy for removal of fibroids  . Tonsillectomy and adenoidectomy    . Cesarean section  2004    x 1  . Myomectomy  2002  . Shoulder arthroscopy Left 04/30/2014    Procedure: Left Shoulder Arthroscopy and Debridement;  Surgeon: Nadara MustardMarcus Duda V, MD;  [facility];  Service: Orthopedics;  Laterality: Left;  . Wisdom tooth extraction    . Upper gi endoscopy    . Colonoscopy      Family History  Problem Relation Age of Onset  . Hypertension Mother     father, sisters  . Diabetes Mother     sister  . Kidney cancer Mother   . Cancer Mother   . Cholelithiasis Sister   . Clotting disorder Daughter   . Heart disease Maternal Uncle   . Heart disease Maternal Grandfather     Social History:  reports that she has never smoked. She has never used smokeless tobacco. She reports that she drinks alcohol. She reports that she does not use illicit drugs.    Review of Systems        Lipids:  Her LDL is better controlled with  Zocor She does have a family history of CAD      Lab Results  Component Value Date   CHOL 152 09/24/2014   HDL 39.30 09/24/2014   LDLCALC 139* 07/22/2014   LDLDIRECT 72.0 09/24/2014   TRIG 230.0* 09/24/2014   CHOLHDL 4 09/24/2014                  The blood pressure has been  treated with Aldactone since 2008.  Also on 10 mg Jardiance   Continues to take 100 mg and potassium is stable    Lab Results  Component Value Date   CREATININE 0.70 12/24/2014   BUN 10 12/24/2014   NA 136 12/24/2014   K 3.9 12/24/2014   CL 101 12/24/2014   CO2 27 12/24/2014        Physical Examination:  BP 122/94 mmHg  Pulse 78  Temp(Src) 97.7 F (36.5 C)  Resp 16  Ht 5\' 6"  (1.676 m)  Wt 276 lb (125.193 kg)  BMI 44.57 kg/m2  SpO2 97%  LMP  (Approximate)      Repeat blood pressure with large blood pressure cuff 110/78  No ankle edema  ASSESSMENT:  Diabetes type 2, uncontrolled    She has fair control of her blood sugars with A1c now 7.4 She has started checking her blood sugars more often than before since she is being provided with a meter through a work study which  is also registering on her mobile phone She has started losing weight now and probably from a little better diet overall and some walking She is tolerating Jardiance with mild periodic candidiasis otherwise continues to take her 3 drug regimen  LIPIDS: She has had  hypercholesterolemia which is well-controlled with Zocor  Hypertension: Controlled with Aldactone.  PLAN:   No change in medications  Although she may benefit from 25 mg Jardiance this may tend to cause more candidiasis   Consistent exerciseAn more consistent diet, discussed A1c results   Dannel Rafter 12/31/2014, 3:58 PM   Note: This office note was prepared with Insurance underwriter. Any transcriptional errors that result from this process are unintentional.

## 2014-12-31 NOTE — Patient Instructions (Signed)
Keep consistent diet and exercise

## 2015-01-05 NOTE — Anesthesia Preprocedure Evaluation (Addendum)
Anesthesia Evaluation  Patient identified by MRN, date of birth, ID band Patient awake    Reviewed: Allergy & Precautions, NPO status , Patient's Chart, lab work & pertinent test results, reviewed documented beta blocker date and time   Airway Mallampati: III   Neck ROM: Full    Dental  (+) Teeth Intact, Dental Advisory Given   Pulmonary shortness of breath, asthma , sleep apnea and Continuous Positive Airway Pressure Ventilation ,  breath sounds clear to auscultation        Cardiovascular hypertension, Pt. on medications Rhythm:Regular     Neuro/Psych Anxiety Depression    GI/Hepatic GERD-  Medicated,  Endo/Other  diabetes, Type 2Morbid obesity  Renal/GU      Musculoskeletal   Abdominal (+) + obese,   Peds  Hematology  (+) anemia , 9/30   Anesthesia Other Findings   Reproductive/Obstetrics                         Anesthesia Physical Anesthesia Plan  ASA: III  Anesthesia Plan: General   Post-op Pain Management:    Induction: Intravenous  Airway Management Planned: Oral ETT  Additional Equipment:   Intra-op Plan:   Post-operative Plan: Extubation in OR  Informed Consent: I have reviewed the patients History and Physical, chart, labs and discussed the procedure including the risks, benefits and alternatives for the proposed anesthesia with the patient or authorized representative who has indicated his/her understanding and acceptance.     Plan Discussed with:   Anesthesia Plan Comments:         Anesthesia Quick Evaluation

## 2015-01-07 ENCOUNTER — Other Ambulatory Visit: Payer: Self-pay | Admitting: Endocrinology

## 2015-01-08 NOTE — H&P (Signed)
Julia Estrada is an 44 y.o. female. G4 P1031 has had a history of persistent heavy and irregular bleeding which has not responded to medical therapy. Did have recent sonohyterogram which did not reveal any anatomic defects. She continued to have irregular bleeding on BCP's and now is to undergo D&C hysteroscopy and endometrial ablation.    Pertinent Gynecological History:  Bleeding: dysfunctional uterine bleeding Contraception: OCP (estrogen/progesterone)      Past Medical History  Diagnosis Date  . Anemia     iron deficiency  . Obesity     BMI=44  . Vitamin B12 deficiency   . Vitamin D deficiency   . Depression 2011  . Diabetes mellitus 2010    type II  . GERD (gastroesophageal reflux disease) 2004  . Hypertension 2008  . Hyperlipidemia   . Shortness of breath     with exercise  . Anxiety   . Sleep apnea     uses CPAP every night    Past Surgical History  Procedure Laterality Date  . Fibroidectomy  2002    laparotomy for removal of fibroids  . Tonsillectomy and adenoidectomy    . Cesarean section  2004    x 1  . Myomectomy  2002  . Shoulder arthroscopy Left 04/30/2014    Procedure: Left Shoulder Arthroscopy and Debridement;  Surgeon: Nadara Mustard, MD;  Location: [facility] OR;  Service: Orthopedics;  Laterality: Left;  . Wisdom tooth extraction    . Upper gi endoscopy    . Colonoscopy      Family History  Problem Relation Age of Onset  . Hypertension Mother     father, sisters  . Diabetes Mother     sister  . Kidney cancer Mother   . Cancer Mother   . Cholelithiasis Sister   . Clotting disorder Daughter   . Heart disease Maternal Uncle   . Heart disease Maternal Grandfather     Social History:  reports that she has never smoked. She has never used smokeless tobacco. She reports that she drinks alcohol. She reports that she does not use illicit drugs.  Allergies:  Allergies  Allergen Reactions  . Codeine Sulfate Nausea And Vomiting  . Hydrochlorothiazide  W-Triamterene     REACTION: cramping  . Oxycodone-Acetaminophen Nausea And Vomiting  . Prednisone Hives  . Victoza [Liraglutide]     n/v    No prescriptions prior to admission    Review of Systems  Constitutional: Negative for fever and chills.  Respiratory: Negative for cough, hemoptysis, sputum production and shortness of breath.   Cardiovascular: Negative for chest pain and palpitations.  Gastrointestinal: Negative for heartburn, nausea, vomiting and abdominal pain.  Genitourinary: Positive for hematuria. Negative for dysuria, urgency and frequency.    There were no vitals taken for this visit. Physical Exam  Constitutional: She is oriented to person, place, and time. She appears well-developed and well-nourished.  HENT:  Head: Normocephalic and atraumatic.  Eyes: Conjunctivae and EOM are normal. Pupils are equal, round, and reactive to light.  Neck: Neck supple. No tracheal deviation present. No thyromegaly present.  Cardiovascular: Normal rate, regular rhythm and normal heart sounds.   Respiratory: Effort normal and breath sounds normal.  GI: Soft. Bowel sounds are normal. She exhibits no distension and no mass. There is no tenderness. There is no rebound and no guarding.  Neurological: She is alert and oriented to person, place, and time.  Psychiatric: She has a normal mood and affect. Her behavior is normal. Judgment and  thought content normal.  Gyn Exam:   External genitalia: WNL   BUS: WNL   Vagina: without lesion or abnormal discharge   Cervix without lesion   Uterus; Top normal size   Adnexa: No masses noted non tender  No results found for this or any previous visit (from the past 24 hour(s)).  No results found.  Impresson: Menometrorrhagia unresponsive to medical therapy  Plan : D&C hysteroscopy and endometrial ablation. Risks and benefits were discussed and informed consent obtained.  Cypress Fanfan,ALLeN 01/08/2015, 6:32 PM

## 2015-01-09 ENCOUNTER — Ambulatory Visit (HOSPITAL_COMMUNITY)
Admission: RE | Admit: 2015-01-09 | Discharge: 2015-01-09 | Disposition: A | Discharge status: Home / Self Care | Payer: 59 | Source: Ambulatory Visit | Attending: Obstetrics and Gynecology | Admitting: Obstetrics and Gynecology

## 2015-01-09 ENCOUNTER — Ambulatory Visit (HOSPITAL_COMMUNITY): Payer: 59 | Admitting: Anesthesiology

## 2015-01-09 ENCOUNTER — Encounter (HOSPITAL_COMMUNITY): Payer: Self-pay | Admitting: *Deleted

## 2015-01-09 ENCOUNTER — Encounter (HOSPITAL_COMMUNITY)
Admission: RE | Disposition: A | Discharge status: Home / Self Care | Payer: Self-pay | Source: Ambulatory Visit | Attending: Obstetrics and Gynecology

## 2015-01-09 DIAGNOSIS — N920 Excessive and frequent menstruation with regular cycle: Secondary | ICD-10-CM | POA: Diagnosis not present

## 2015-01-09 DIAGNOSIS — Z6841 Body Mass Index (BMI) 40.0 and over, adult: Secondary | ICD-10-CM | POA: Insufficient documentation

## 2015-01-09 DIAGNOSIS — E785 Hyperlipidemia, unspecified: Secondary | ICD-10-CM | POA: Insufficient documentation

## 2015-01-09 DIAGNOSIS — G4730 Sleep apnea, unspecified: Secondary | ICD-10-CM | POA: Insufficient documentation

## 2015-01-09 DIAGNOSIS — F419 Anxiety disorder, unspecified: Secondary | ICD-10-CM | POA: Diagnosis not present

## 2015-01-09 DIAGNOSIS — K219 Gastro-esophageal reflux disease without esophagitis: Secondary | ICD-10-CM | POA: Insufficient documentation

## 2015-01-09 DIAGNOSIS — Z888 Allergy status to other drugs, medicaments and biological substances status: Secondary | ICD-10-CM | POA: Diagnosis not present

## 2015-01-09 DIAGNOSIS — F329 Major depressive disorder, single episode, unspecified: Secondary | ICD-10-CM | POA: Insufficient documentation

## 2015-01-09 DIAGNOSIS — E559 Vitamin D deficiency, unspecified: Secondary | ICD-10-CM | POA: Diagnosis not present

## 2015-01-09 DIAGNOSIS — I10 Essential (primary) hypertension: Secondary | ICD-10-CM | POA: Insufficient documentation

## 2015-01-09 DIAGNOSIS — E538 Deficiency of other specified B group vitamins: Secondary | ICD-10-CM | POA: Diagnosis not present

## 2015-01-09 DIAGNOSIS — E669 Obesity, unspecified: Secondary | ICD-10-CM | POA: Diagnosis not present

## 2015-01-09 DIAGNOSIS — E119 Type 2 diabetes mellitus without complications: Secondary | ICD-10-CM | POA: Diagnosis not present

## 2015-01-09 DIAGNOSIS — D509 Iron deficiency anemia, unspecified: Secondary | ICD-10-CM | POA: Insufficient documentation

## 2015-01-09 DIAGNOSIS — Z885 Allergy status to narcotic agent status: Secondary | ICD-10-CM | POA: Diagnosis not present

## 2015-01-09 HISTORY — PX: DILITATION & CURRETTAGE/HYSTROSCOPY WITH NOVASURE ABLATION: SHX5568

## 2015-01-09 LAB — GLUCOSE, CAPILLARY
GLUCOSE-CAPILLARY: 101 mg/dL — AB (ref 65–99)
Glucose-Capillary: 121 mg/dL — ABNORMAL HIGH (ref 65–99)

## 2015-01-09 LAB — CBC
HEMATOCRIT: 29.4 % — AB (ref 36.0–46.0)
HEMOGLOBIN: 9.2 g/dL — AB (ref 12.0–15.0)
MCH: 22.1 pg — ABNORMAL LOW (ref 26.0–34.0)
MCHC: 31.3 g/dL (ref 30.0–36.0)
MCV: 70.7 fL — AB (ref 78.0–100.0)
PLATELETS: 424 10*3/uL — AB (ref 150–400)
RBC: 4.16 MIL/uL (ref 3.87–5.11)
RDW: 18.2 % — ABNORMAL HIGH (ref 11.5–15.5)
WBC: 8.7 10*3/uL (ref 4.0–10.5)

## 2015-01-09 LAB — BASIC METABOLIC PANEL
Anion gap: 5 (ref 5–15)
BUN: 11 mg/dL (ref 6–20)
CO2: 25 mmol/L (ref 22–32)
CREATININE: 0.75 mg/dL (ref 0.44–1.00)
Calcium: 8.6 mg/dL — ABNORMAL LOW (ref 8.9–10.3)
Chloride: 103 mmol/L (ref 101–111)
GFR calc Af Amer: >60 mL/min (ref >60)
GFR calc non Af Amer: >60 mL/min (ref >60)
GLUCOSE: 97 mg/dL (ref 65–99)
Potassium: 3.5 mmol/L (ref 3.5–5.1)
Sodium: 133 mmol/L — ABNORMAL LOW (ref 135–145)

## 2015-01-09 LAB — PREGNANCY, URINE: Preg Test, Ur: NEGATIVE

## 2015-01-09 SURGERY — DILATATION & CURETTAGE/HYSTEROSCOPY WITH NOVASURE ABLATION
Anesthesia: General | Site: Vagina

## 2015-01-09 MED ORDER — PROPOFOL 10 MG/ML IV BOLUS
INTRAVENOUS | Status: DC | PRN
  Administered 2015-01-09: 200 mg via INTRAVENOUS

## 2015-01-09 MED ORDER — MIDAZOLAM HCL 2 MG/2ML IJ SOLN
INTRAMUSCULAR | Status: DC | PRN
  Administered 2015-01-09: 2 mg via INTRAVENOUS

## 2015-01-09 MED ORDER — FENTANYL CITRATE (PF) 100 MCG/2ML IJ SOLN: 25.0000 ug | INTRAMUSCULAR | Status: DC | PRN

## 2015-01-09 MED ORDER — SCOPOLAMINE 1 MG/3DAYS TD PT72
MEDICATED_PATCH | TRANSDERMAL | Status: DC
Start: 2015-01-09 — End: 2015-01-09
  Administered 2015-01-09: 1.5 mg via TRANSDERMAL
  Filled 2015-01-09: qty 1

## 2015-01-09 MED ORDER — IBUPROFEN 600 MG PO TABS: 600.0000 mg | ORAL_TABLET | Freq: Four times a day (QID) | ORAL | Status: DC | PRN

## 2015-01-09 MED ORDER — FENTANYL CITRATE (PF) 250 MCG/5ML IJ SOLN
INTRAMUSCULAR | Status: AC
  Filled 2015-01-09: qty 5

## 2015-01-09 MED ORDER — DEXTROSE 5 % IV SOLN
3.0000 g | INTRAVENOUS | Status: AC
  Administered 2015-01-09: 3 g via INTRAVENOUS
  Filled 2015-01-09: qty 3000

## 2015-01-09 MED ORDER — LACTATED RINGERS IV SOLN
INTRAVENOUS | Status: DC
  Administered 2015-01-09 (×2): via INTRAVENOUS

## 2015-01-09 MED ORDER — ONDANSETRON HCL 4 MG/2ML IJ SOLN
INTRAMUSCULAR | Status: AC
  Administered 2015-01-09: 4 mg via INTRAVENOUS
  Filled 2015-01-09: qty 2

## 2015-01-09 MED ORDER — KETOROLAC TROMETHAMINE 30 MG/ML IJ SOLN
INTRAMUSCULAR | Status: DC | PRN
  Administered 2015-01-09: 30 mg via INTRAVENOUS

## 2015-01-09 MED ORDER — LIDOCAINE HCL 1 % IJ SOLN
INTRAMUSCULAR | Status: AC
  Filled 2015-01-09: qty 20

## 2015-01-09 MED ORDER — ONDANSETRON HCL 4 MG/2ML IJ SOLN
4.0000 mg | Freq: Once | INTRAMUSCULAR | Status: AC
  Administered 2015-01-09: 4 mg via INTRAVENOUS

## 2015-01-09 MED ORDER — CEFAZOLIN (ANCEF) 1 G IV SOLR: 1.0000 g | INTRAVENOUS | Status: DC

## 2015-01-09 MED ORDER — MIDAZOLAM HCL 2 MG/2ML IJ SOLN
INTRAMUSCULAR | Status: AC
  Filled 2015-01-09: qty 2

## 2015-01-09 MED ORDER — SCOPOLAMINE 1 MG/3DAYS TD PT72
1.0000 | MEDICATED_PATCH | Freq: Once | TRANSDERMAL | Status: DC
  Administered 2015-01-09: 1.5 mg via TRANSDERMAL

## 2015-01-09 MED ORDER — LIDOCAINE HCL 1 % IJ SOLN
INTRAMUSCULAR | Status: DC | PRN
  Administered 2015-01-09: 10 mL

## 2015-01-09 MED ORDER — SUCCINYLCHOLINE CHLORIDE 20 MG/ML IJ SOLN
INTRAMUSCULAR | Status: DC | PRN
  Administered 2015-01-09: 140 mg via INTRAVENOUS

## 2015-01-09 MED ORDER — SUFENTANIL CITRATE 50 MCG/ML IV SOLN
INTRAVENOUS | Status: DC | PRN
  Administered 2015-01-09: 100 ug via INTRAVENOUS

## 2015-01-09 MED ORDER — MEPERIDINE HCL 25 MG/ML IJ SOLN: 6.2500 mg | INTRAMUSCULAR | Status: DC | PRN

## 2015-01-09 MED ORDER — ACETAMINOPHEN 10 MG/ML IV SOLN
1000.0000 mg | Freq: Once | INTRAVENOUS | Status: AC
  Administered 2015-01-09: 1000 mg via INTRAVENOUS
  Filled 2015-01-09: qty 100

## 2015-01-09 MED ORDER — FENTANYL CITRATE (PF) 100 MCG/2ML IJ SOLN
INTRAMUSCULAR | Status: DC | PRN
  Administered 2015-01-09: 100 ug via INTRAVENOUS
  Administered 2015-01-09: 50 ug via INTRAVENOUS
  Administered 2015-01-09: 100 ug via INTRAVENOUS

## 2015-01-09 MED ORDER — PROMETHAZINE HCL 25 MG/ML IJ SOLN: 6.2500 mg | INTRAMUSCULAR | Status: DC | PRN

## 2015-01-09 MED ORDER — ONDANSETRON HCL 4 MG/2ML IJ SOLN
INTRAMUSCULAR | Status: DC | PRN
  Administered 2015-01-09: 4 mg via INTRAVENOUS

## 2015-01-09 SURGICAL SUPPLY — 16 items
ABLATOR ENDOMETRIAL BIPOLAR (ABLATOR) ×3 IMPLANT
CATH ROBINSON RED A/P 16FR (CATHETERS) ×3 IMPLANT
CLOTH BEACON ORANGE TIMEOUT ST (SAFETY) ×3 IMPLANT
CONTAINER PREFILL 10% NBF 60ML (FORM) ×6 IMPLANT
GLOVE BIO SURGEON STRL SZ7.5 (GLOVE) ×3 IMPLANT
GLOVE BIOGEL PI IND STRL 7.5 (GLOVE) ×1 IMPLANT
GLOVE BIOGEL PI INDICATOR 7.5 (GLOVE) ×2
GOWN STRL REUS W/TWL 2XL LVL3 (GOWN DISPOSABLE) ×3 IMPLANT
GOWN STRL REUS W/TWL LRG LVL3 (GOWN DISPOSABLE) ×6 IMPLANT
PACK VAGINAL MINOR WOMEN LF (CUSTOM PROCEDURE TRAY) ×3 IMPLANT
PAD OB MATERNITY 4.3X12.25 (PERSONAL CARE ITEMS) ×3 IMPLANT
PAD PREP 24X48 CUFFED NSTRL (MISCELLANEOUS) ×3 IMPLANT
TOWEL OR 17X24 6PK STRL BLUE (TOWEL DISPOSABLE) ×6 IMPLANT
TUBING AQUILEX INFLOW (TUBING) ×3 IMPLANT
TUBING AQUILEX OUTFLOW (TUBING) ×3 IMPLANT
WATER STERILE IRR 1000ML POUR (IV SOLUTION) ×3 IMPLANT

## 2015-01-09 NOTE — Anesthesia Procedure Notes (Signed)
Procedure Name: Intubation Date/Time: 01/09/2015 12:13 PM Performed by: Shakayla Hickox, Jannet Askew Pre-anesthesia Checklist: Patient identified, Timeout performed, Emergency Drugs available, Suction available and Patient being monitored Patient Re-evaluated:Patient Re-evaluated prior to inductionOxygen Delivery Method: Circle system utilized Preoxygenation: Pre-oxygenation with 100% oxygen Intubation Type: IV induction Ventilation: Mask ventilation without difficulty Laryngoscope Size: Mac and 3 Grade View: Grade III Tube type: Oral Tube size: 7.0 mm Number of attempts: 1 Airway Equipment and Method: Patient positioned with wedge pillow Placement Confirmation: ETT inserted through vocal cords under direct vision Secured at: 21 cm Dental Injury: Teeth and Oropharynx as per pre-operative assessment

## 2015-01-09 NOTE — Transfer of Care (Signed)
Immediate Anesthesia Transfer of Care Note  Patient: Julia Estrada  Procedure(s) Performed: Procedure(s): DILATATION & CURETTAGE/HYSTEROSCOPY WITH NOVASURE ABLATION (N/A)  Patient Location: PACU  Anesthesia Type:General  Level of Consciousness: awake, alert  and oriented  Airway & Oxygen Therapy: Patient Spontanous Breathing and Patient connected to nasal cannula oxygen  Post-op Assessment: Report given to RN and Post -op Vital signs reviewed and stable  Post vital signs: Reviewed and stable  Last Vitals:  Filed Vitals:   01/09/15 1049  BP: 132/93  Pulse: 77  Temp: 37.4 C  Resp: 18    Complications: No apparent anesthesia complications

## 2015-01-09 NOTE — H&P (Signed)
  Status unchanged will proceed with planned D&C hysteroscopy and Novasure ablation.

## 2015-01-09 NOTE — Anesthesia Postprocedure Evaluation (Signed)
  Anesthesia Post-op Note  Patient: Julia Estrada  Procedure(s) Performed: Procedure(s): DILATATION & CURETTAGE/HYSTEROSCOPY WITH NOVASURE ABLATION (N/A)  Patient Location: PACU  Anesthesia Type:General  Level of Consciousness: awake and oriented  Airway and Oxygen Therapy: Patient Spontanous Breathing and Patient connected to nasal cannula oxygen  Post-op Pain: mild  Post-op Assessment: Post-op Vital signs reviewed, Patient's Cardiovascular Status Stable, Respiratory Function Stable, Patent Airway and No signs of Nausea or vomiting          Had fentanyl in surgery.  Now complaining of some discomfort, will give IV Tylenol, do not want to depress respiratory drive.    Post-op Vital Signs: Reviewed and stable  Last Vitals:  Filed Vitals:   01/09/15 1300  BP: 122/75  Pulse: 97  Temp:   Resp: 16    Complications: No apparent anesthesia complications

## 2015-01-09 NOTE — Brief Op Note (Signed)
01/09/2015  12:37 PM  PATIENT:  Julia Estrada  44 y.o. female  PRE-OPERATIVE DIAGNOSIS:  menorrhagia  POST-OPERATIVE DIAGNOSIS:  menorrhagia  PROCEDURE:  Procedure(s): DILATATION & CURETTAGE/HYSTEROSCOPY WITH NOVASURE ABLATION (N/A)  SURGEON:  Surgeon(s) and Role:    * Donovan Kail, MD - Primary  PHYSICIAN ASSISTANT:    ANESTHESIA:   general  EBL:   10  BLOOD ADMINISTERED:none  DRAINS: none    LOCAL MEDICATIONS USED:  LIDOCAINE   SPECIMEN:  No Specimen and Source of Specimen:  endometrial currettings  DISPOSITION OF SPECIMEN:  PATHOLOGY  COUNTS:  YES  TOURNIQUET:  * No tourniquets in log *  DICTATION: .Other Dictation: Dictation Number [id number]  PLAN OF CARE: Discharge to home after PACU  PATIENT DISPOSITION:  PACU - hemodynamically stable.   Delay start of Pharmacological VTE agent (>24hrs) due to surgical blood loss or risk of bleeding: not applicable

## 2015-01-09 NOTE — Discharge Instructions (Signed)

## 2015-01-10 NOTE — Op Note (Signed)
[name], [name] NO.:  [id number]  MEDICAL RECORD NO.:  [id number]  LOCATION:  [facility]                          FACILITY:  [facility]  PHYSICIAN:  Miguel Aschoff, M.D.       DATE OF BIRTH:  [birth year]  DATE OF PROCEDURE:  01/09/2015 DATE OF DISCHARGE:  01/09/2015                              OPERATIVE REPORT   PREOPERATIVE DIAGNOSIS:  Menorrhagia.  POSTOPERATIVE DIAGNOSIS:  Menorrhagia.  PROCEDURE:  Cervical dilatation, hysteroscopy, uterine curettage followed by NovaSure endometrial ablation.  SURGEON:  Miguel Aschoff, M.D.  ANESTHESIA:  General.  COMPLICATIONS:  None.  JUSTIFICATIONS:  Patient is a 44 year old black female, with history of very heavy menses that has not responded well to medical therapy. Because of continued heavy bleeding, she presents now to undergo D and C, hysteroscopy, NovaSure endometrial ablation in effort to control her heavy flow.  The patient had a preoperative sonohysterogram that did not reveal any intracavitary lesions.  Risks and benefits of the procedure were discussed and informed consent has been obtained.  DESCRIPTION OF PROCEDURE:  The patient was taken to the operating room, placed in supine position.  General anesthesia was administered without difficulty.  She was then placed in the dorsal lithotomy position. Prepped and draped in usual sterile fashion.  Once this was done, examination under anesthesia was carried out.  This revealed normal external genitalia.  Normal Bartholin and Skene glands.  Normal urethra. The vaginal vault had no lesion.  The cervix was free of lesions.  The uterus was somewhat globular, mid position.  No adnexal masses were noted.  At this point, a speculum was placed in the vaginal vault. Anterior cervical lip was grasped with a tenaculum and then the uterine cavity was sounded to 7.5 Cm.  A cervical length of 3.5 cm was then determined.  After this was done, the hysteroscope was advanced  through the endocervical canal.  No endocervical lesions were noted.  The endometrial cavity was visualized.  There were no polyps or submucous myomas noted in the cavity nor any other lesions.  At this point, the hysteroscopy was completed, a medium-size serrated curette was then used to curette the uterine cavity.  The tissue was collected and sent for histologic study.  At this point, the NovaSure endometrial ablation unit was introduced through the cervix.  A cavity width of 4.5 cm was then established and then a treatment cycle of 1 minute 55 seconds was carried out at 99 watts successfully.  On completion of the treatment cycle after the cavity assessment test was passed, the unit was removed intact.  The hysteroscope was then placed back into the uterine cavity. The cavity appeared to be adequately coagulated.  At this point, the cervix was injected with 10 mL 1% Xylocaine for postop analgesia.  All the instruments were removed.  The patient was reversed from anesthetic and brought to recovery room in satisfactory condition.  The estimated blood loss was minimal.  The plan is for the patient to be followed up in 1 week.  She is to call for any problems such as fever, pain, or heavy bleeding.  She is to resume all her  preoperative medications.     Miguel Aschoff, M.D.     AR/MEDQ  D:  01/09/2015  T:  01/10/2015  Job:  [id number]

## 2015-01-12 ENCOUNTER — Encounter (HOSPITAL_COMMUNITY): Payer: Self-pay | Admitting: Obstetrics and Gynecology

## 2015-01-19 IMAGING — CT CT ABD-PELV W/ CM
1 of 3 series · 14 of 32 positions shown, 19 images · IV contrast (OMNIPAQUE 300)
Comparison: None.

CLINICAL DATA: Abdominal pain, diarrhea since 06/24/2014. Denies
urinary symptoms.

EXAM:
CT ABDOMEN AND PELVIS WITH CONTRAST
TECHNIQUE: Multidetector CT imaging of the abdomen and pelvis was performed
using the standard protocol following bolus administration of
intravenous contrast.
CONTRAST:  50mL OMNIPAQUE IOHEXOL 300 MG/ML SOLN, 100mL OMNIPAQUE
IOHEXOL 300 MG/ML SOLN

[Series 3: abd/pel with · axial · 0.85mm/px · z∈[+1286,+1736]mm · 14 of 102 slices shown, 19 images]
[im 6/102  soft-tissue]
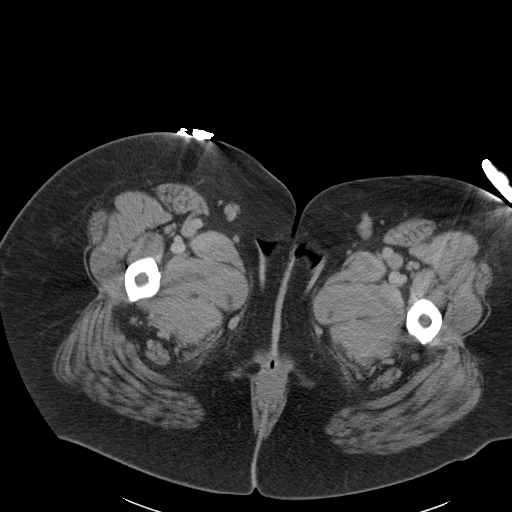
[im 6/102  bone]
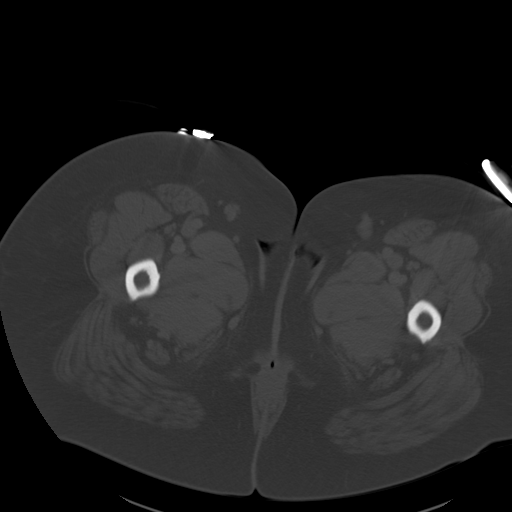
[im 16/102  soft-tissue]
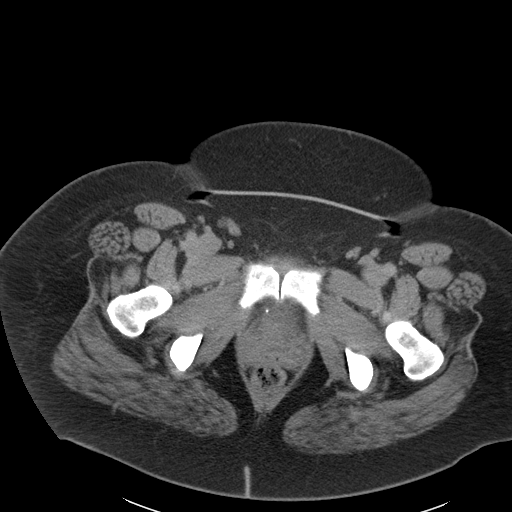
[im 22/102  soft-tissue]
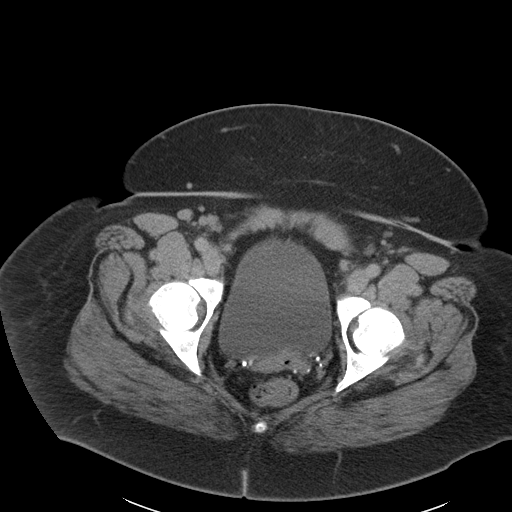
[im 27/102  soft-tissue]
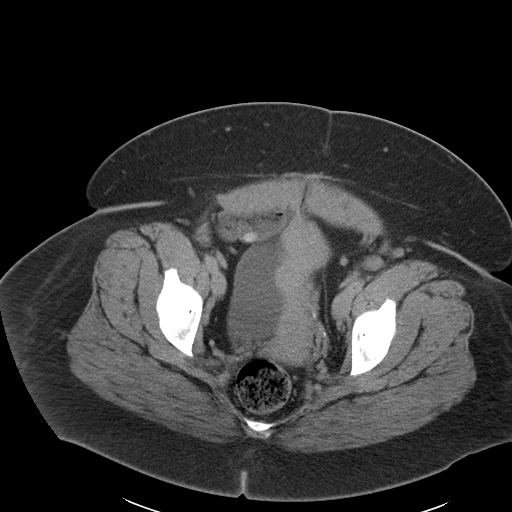
[im 38/102  soft-tissue]
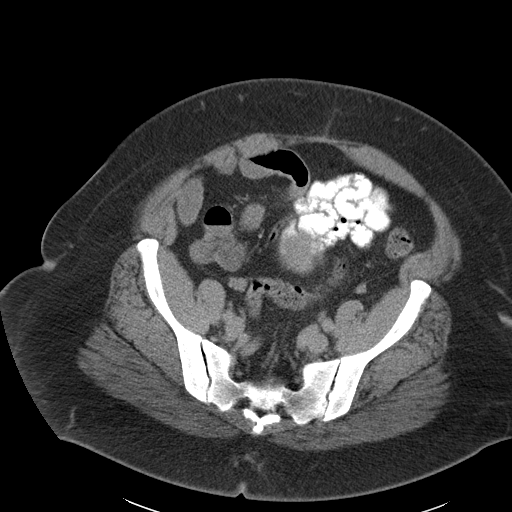
[im 43/102  soft-tissue]
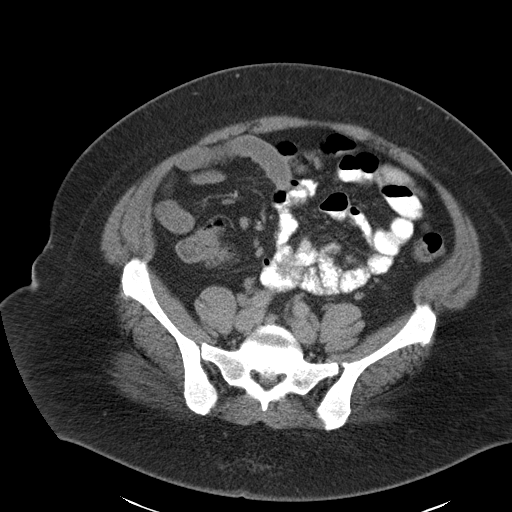
[im 54/102  soft-tissue]
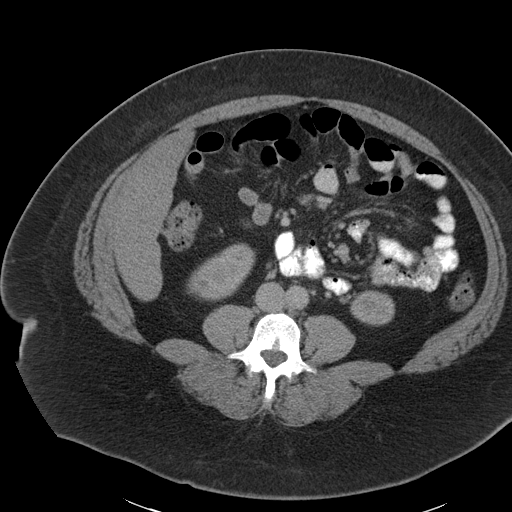
[im 59/102  soft-tissue]
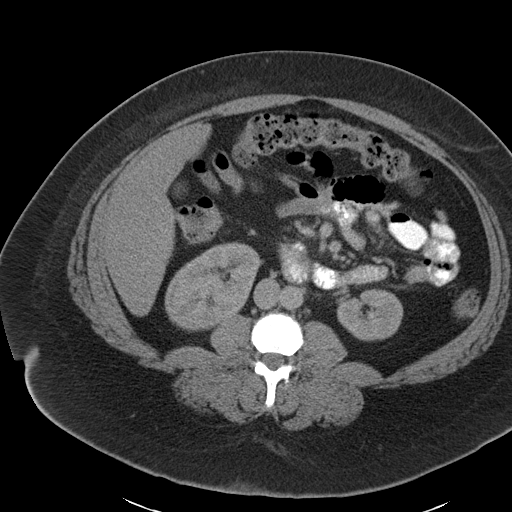
[im 64/102  soft-tissue]
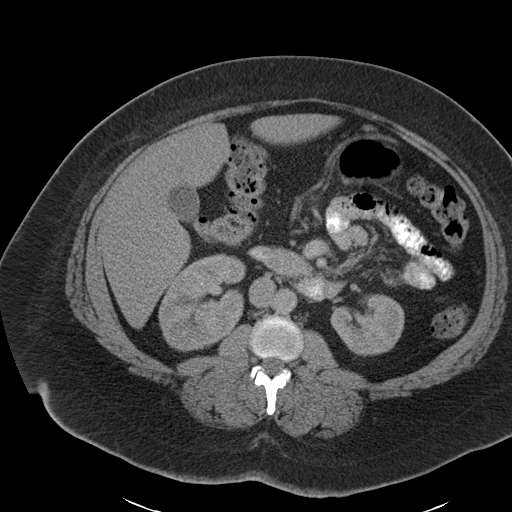
[im 64/102  bone]
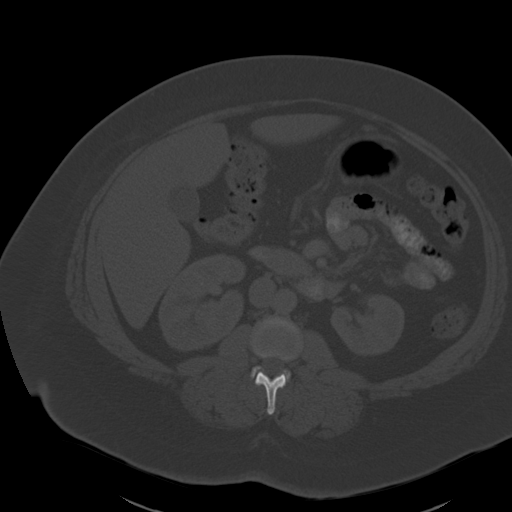
[im 75/102  soft-tissue]
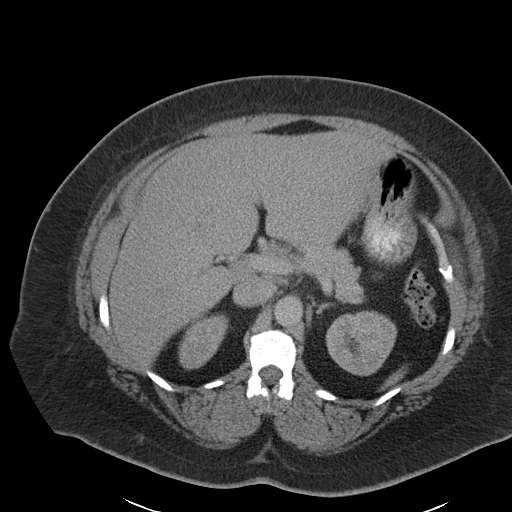
[im 80/102  soft-tissue]
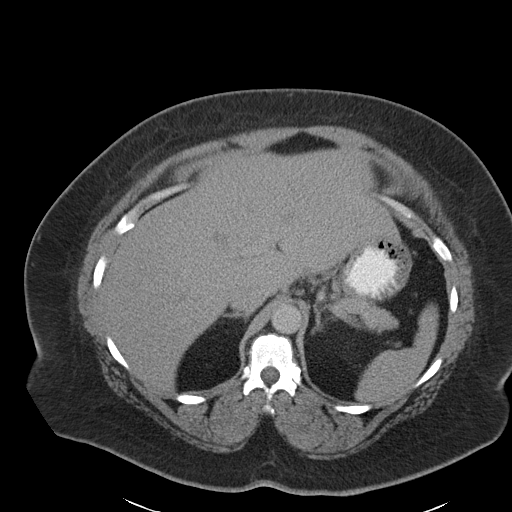
[im 80/102  lung]
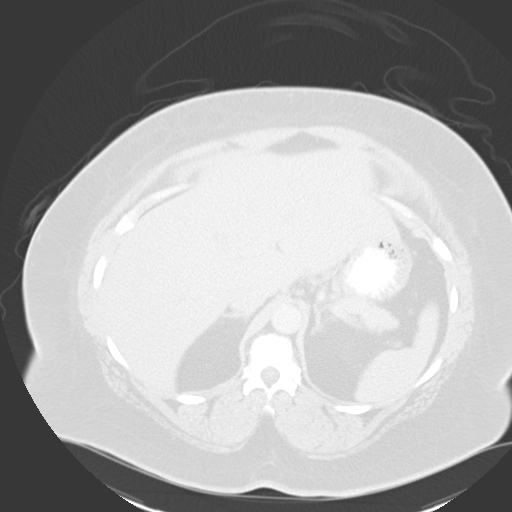
[im 86/102  soft-tissue]
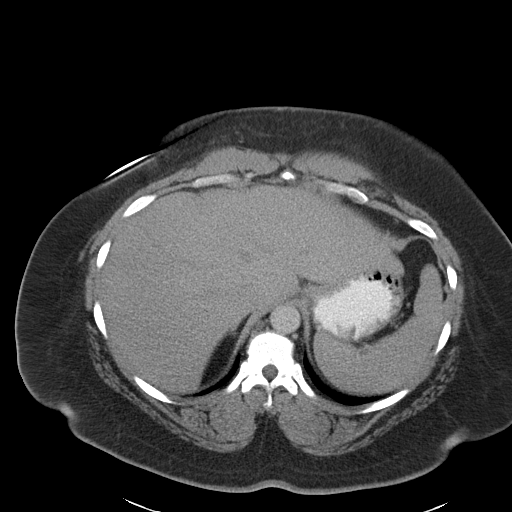
[im 86/102  lung]
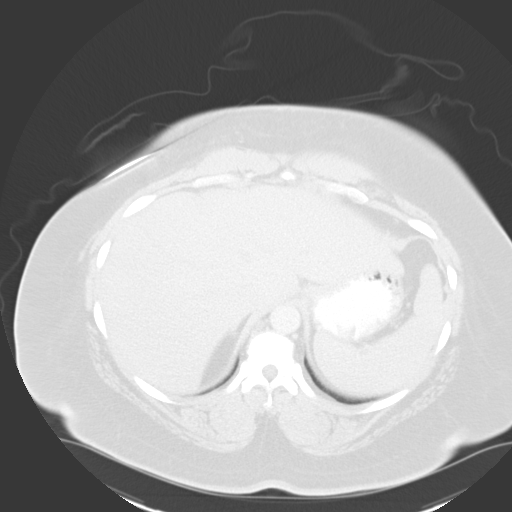
[im 91/102  lung]
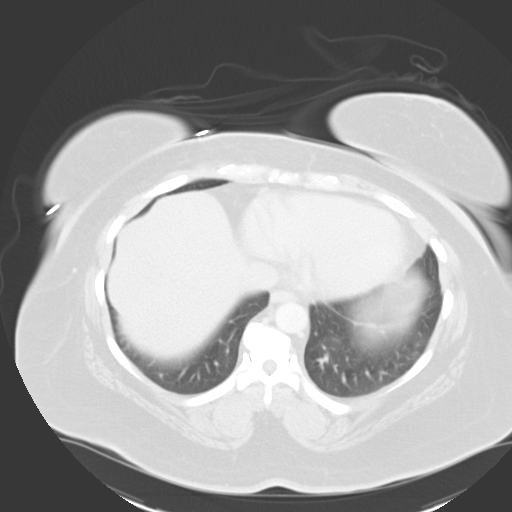
[im 96/102  soft-tissue]
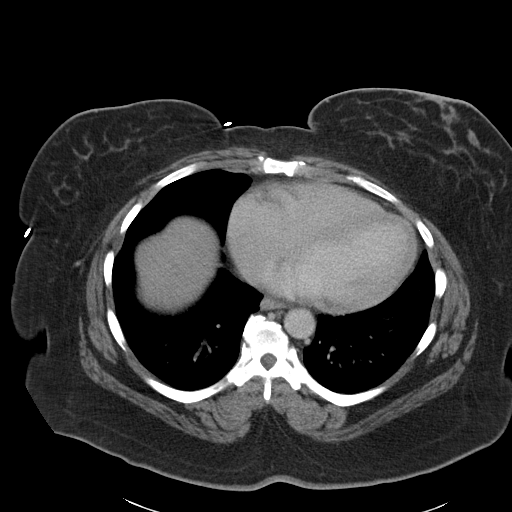
[im 96/102  lung]
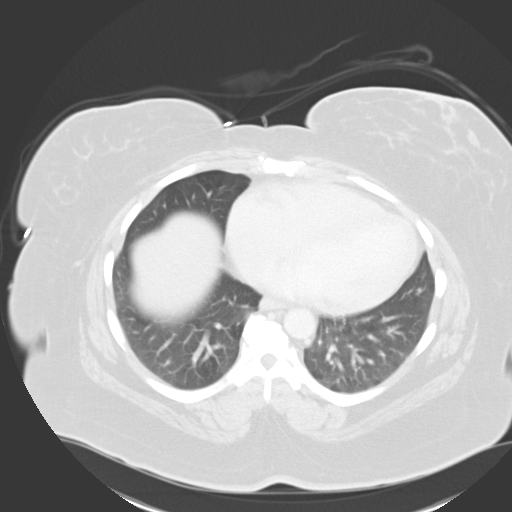

[14 of 32 positions shown; findings below may reference images not displayed]

FINDINGS: The lung bases are clear.

The liver demonstrates no focal abnormality. There is no
intrahepatic or extrahepatic biliary ductal dilatation. The
gallbladder is normal. The spleen demonstrates no focal abnormality.
The kidneys, adrenal glands and pancreas are normal. The bladder is
unremarkable.

The stomach, duodenum, small intestine, and large intestine
demonstrate no contrast extravasation or dilatation. There is no
pneumoperitoneum, pneumatosis, or portal venous gas. There is no
abdominal or pelvic free fluid. There is no lymphadenopathy.

The abdominal aorta is normal in caliber with atherosclerosis.

There are no lytic or sclerotic osseous lesions. Moderate bilateral
facet arthropathy at L5-S1. There are mild degenerative changes of
bilateral SI joints.
IMPRESSION: 1. No acute abdominal or pelvic pathology.

## 2015-01-22 ENCOUNTER — Encounter: Payer: Self-pay | Admitting: Internal Medicine

## 2015-01-25 ENCOUNTER — Other Ambulatory Visit: Payer: Self-pay | Admitting: Internal Medicine

## 2015-01-25 MED ORDER — SCOPOLAMINE 1 MG/3DAYS TD PT72: 1.0000 | MEDICATED_PATCH | TRANSDERMAL | Status: DC

## 2015-02-06 ENCOUNTER — Other Ambulatory Visit: Payer: Self-pay | Admitting: Endocrinology

## 2015-02-06 NOTE — Telephone Encounter (Signed)
Patient is requesting refill of Diflucan, okay to fill?

## 2015-03-30 ENCOUNTER — Other Ambulatory Visit (INDEPENDENT_AMBULATORY_CARE_PROVIDER_SITE_OTHER): Payer: 59

## 2015-03-30 DIAGNOSIS — IMO0002 Reserved for concepts with insufficient information to code with codable children: Secondary | ICD-10-CM

## 2015-03-30 DIAGNOSIS — E1165 Type 2 diabetes mellitus with hyperglycemia: Secondary | ICD-10-CM

## 2015-03-30 LAB — BASIC METABOLIC PANEL
BUN: 10 mg/dL (ref 6–23)
CO2: 26 mEq/L (ref 19–32)
CREATININE: 0.71 mg/dL (ref 0.40–1.20)
Calcium: 9.5 mg/dL (ref 8.4–10.5)
Chloride: 102 mEq/L (ref 96–112)
GFR: 115.02 mL/min (ref >60.00)
Glucose, Bld: 99 mg/dL (ref 70–99)
POTASSIUM: 4 meq/L (ref 3.5–5.1)
Sodium: 137 mEq/L (ref 135–145)

## 2015-03-30 LAB — HEMOGLOBIN A1C: HEMOGLOBIN A1C: 7.6 % — AB (ref 4.6–6.5)

## 2015-04-02 ENCOUNTER — Encounter: Payer: Self-pay | Admitting: Endocrinology

## 2015-04-02 ENCOUNTER — Ambulatory Visit (INDEPENDENT_AMBULATORY_CARE_PROVIDER_SITE_OTHER): Payer: 59 | Admitting: Endocrinology

## 2015-04-02 VITALS — BP 128/82 | HR 90 | Temp 97.7°F | Resp 16 | Ht 66.0 in | Wt 280.2 lb

## 2015-04-02 DIAGNOSIS — I10 Essential (primary) hypertension: Secondary | ICD-10-CM | POA: Diagnosis not present

## 2015-04-02 DIAGNOSIS — E1165 Type 2 diabetes mellitus with hyperglycemia: Secondary | ICD-10-CM | POA: Diagnosis not present

## 2015-04-02 DIAGNOSIS — R635 Abnormal weight gain: Secondary | ICD-10-CM | POA: Diagnosis not present

## 2015-04-02 DIAGNOSIS — IMO0002 Reserved for concepts with insufficient information to code with codable children: Secondary | ICD-10-CM

## 2015-04-02 NOTE — Progress Notes (Signed)
Patient ID: Julia Estrada, female   DOB: 1971/01/04, 44 y.o.   MRN: [id number]           Reason for Appointment: Follow-up for Type 2 Diabetes  History of Present Illness:          Diagnosis: Type 2 diabetes mellitus, date of diagnosis: ?  2011        Past history:  Not clear when her diagnosis was first made but she probably did not have very high sugars. She was initially treated with metformin which was titrated to 2000 mg a day without side effects She was also sent to the diabetes classes at the time of diagnosis At some point she was also given Januvia which has been continued She was also tried on Victoza in 08/2012 but she could not tolerate it for more than a week because of significant nausea and vomiting.  She thinks she may have taken only the 0.6 mg dose Since in 2015 her A1c had been over 8% she was started on Jardiance  Recent history:   Oral hypoglycemic drugs the patient is taking are: Jardiance 10 mg, Tradgenta, metformin ER 2 g      Her A1c had initially improved but now it is starting to go up again She is tolerating all her medications and having a easily treated candidiasis with Jardiance However on this visit her A1c has increased to 7.6 and her compliance with various measures of self-care or poor  She has not checked her blood sugars much as she is not getting the test strips from work and has not done any at home recently Not clear what her blood sugars are although lab glucose was fairly good fasting She has also not been motivated to do any exercise She has been traveling and on medications and has not watched her diet However weight has started going up again       Side effects from medications have been: Vomiting with Victoza which she tried for one week only     Compliance with the medical regimen: Inadequate  Hypoglycemia:   none  Glucose monitoring: Rarely Glucometer:  [employer]     Blood Glucose readings last few readings: 134, 173, 156      Self-care: The diet that the patient has been following is: none, does avoid sweets, may not always control portions or high-fat foods Meals: 3 meals per day. Breakfast is oatmeal or biscuit,   lunch is chicken or pork chop  Has fruit cups chips or popcorn for snacks        Exercise: Has been walking 2-3/7 recently, 30 min at a time Dietician visit, most recent: 1/16              Weight history: Previous range 276 -309  Wt Readings from Last 3 Encounters:  04/02/15 280 lb 3.2 oz (127.098 kg)  12/31/14 276 lb (125.193 kg)  12/25/14 275 lb (124.739 kg)    Glycemic control:   Lab Results  Component Value Date   HGBA1C 7.6* 03/30/2015   HGBA1C 7.4* 12/24/2014   HGBA1C 7.8* 09/24/2014   Lab Results  Component Value Date   MICROALBUR 1.7 12/24/2014   LDLCALC 139* 07/22/2014   CREATININE 0.71 03/30/2015         Medication List       This list is accurate as of: 04/02/15  8:56 AM.  Always use your most recent med list.  clindamycin 300 MG capsule  Commonly known as:  CLEOCIN  Take 1 capsule (300 mg total) by mouth 4 (four) times daily.     diphenhydrAMINE 25 MG tablet  Commonly known as:  BENADRYL  Take 1 tablet (25 mg total) by mouth every 6 (six) hours as needed for itching or allergies (hives).     ferrous sulfate 325 (65 FE) MG tablet  Take 325 mg by mouth daily with breakfast.     fexofenadine 180 MG tablet  Commonly known as:  ALLEGRA  Take 180 mg by mouth daily as needed for allergies.     fluconazole 150 MG tablet  Commonly known as:  DIFLUCAN  TAKE 1 TABLET BY MOUTH ONCE     glucose blood test strip  Commonly known as:  ONE TOUCH ULTRA TEST  Use as instructed to check blood sugar 2 times per day dx code E11.65     ibuprofen 800 MG tablet  Commonly known as:  ADVIL,MOTRIN  Take 800 mg by mouth every 8 (eight) hours as needed for fever, headache or mild pain.     JARDIANCE 10 MG Tabs tablet  Generic drug:  empagliflozin  Take 1 tablet  by mouth  daily     LORazepam 0.5 MG tablet  Commonly known as:  ATIVAN  Take 1 tablet (0.5 mg total) by mouth every 8 (eight) hours as needed for anxiety.     metFORMIN 500 MG 24 hr tablet  Commonly known as:  GLUCOPHAGE-XR  Take 4 tablets by mouth  daily     ONETOUCH DELICA LANCETS FINE Misc  Use 2 per day dx code E11.65     pantoprazole 40 MG tablet  Commonly known as:  PROTONIX  Take 1 tablet by mouth  daily     ranitidine 300 MG tablet  Commonly known as:  ZANTAC  Take 1 tablet by mouth at  bedtime.     scopolamine 1 MG/3DAYS  Commonly known as:  TRANSDERM-SCOP (1.5 MG)  Place 1 patch (1.5 mg total) onto the skin every 3 (three) days.     simvastatin 20 MG tablet  Commonly known as:  ZOCOR  Take 1 tablet (20 mg total) by mouth at bedtime.     spironolactone 100 MG tablet  Commonly known as:  ALDACTONE  Take 1 tablet by mouth  daily     TRADJENTA 5 MG Tabs tablet  Generic drug:  linagliptin  Take 1 tablet by mouth  daily     triamcinolone cream 0.5 %  Commonly known as:  KENALOG  Apply 1 application topically 3 (three) times daily. On rash     valACYclovir 1000 MG tablet  Commonly known as:  VALTREX  Take 1,000 mg by mouth daily.     vitamin B-12 1000 MCG tablet  Commonly known as:  CYANOCOBALAMIN  Take 1,000 mcg by mouth daily.        Allergies:  Allergies  Allergen Reactions  . Codeine Sulfate Nausea And Vomiting  . Hydrochlorothiazide W-Triamterene     REACTION: cramping  . Oxycodone-Acetaminophen Nausea And Vomiting  . Prednisone Hives  . Victoza [Liraglutide]     n/v    Past Medical History  Diagnosis Date  . Anemia     iron deficiency  . Obesity     BMI=44  . Vitamin B12 deficiency   . Vitamin D deficiency   . Depression 2011  . Diabetes mellitus 2010    type II  . GERD (gastroesophageal reflux disease)  2004  . Hypertension 2008  . Hyperlipidemia   . Shortness of breath     with exercise  . Anxiety   . Sleep apnea     uses  CPAP every night    Past Surgical History  Procedure Laterality Date  . Fibroidectomy  2002    laparotomy for removal of fibroids  . Tonsillectomy and adenoidectomy    . Cesarean section  2004    x 1  . Myomectomy  2002  . Shoulder arthroscopy Left 04/30/2014    Procedure: Left Shoulder Arthroscopy and Debridement;  Surgeon: Nadara Mustard, MD;  Location: [facility] OR;  Service: Orthopedics;  Laterality: Left;  . Wisdom tooth extraction    . Upper gi endoscopy    . Colonoscopy    . Dilitation & currettage/hystroscopy with novasure ablation N/A 01/09/2015    Procedure: DILATATION & CURETTAGE/HYSTEROSCOPY WITH NOVASURE ABLATION;  Surgeon: Donovan Kail, MD;  Location: [facility] ORS;  Service: Gynecology;  Laterality: N/A;    Family History  Problem Relation Age of Onset  . Hypertension Mother     father, sisters  . Diabetes Mother     sister  . Kidney cancer Mother   . Cancer Mother   . Cholelithiasis Sister   . Clotting disorder Daughter   . Heart disease Maternal Uncle   . Heart disease Maternal Grandfather     Social History:  reports that she has never smoked. She has never used smokeless tobacco. She reports that she drinks alcohol. She reports that she does not use illicit drugs.    Review of Systems        Lipids:  Her LDL is better controlled with  Zocor She does have a family history of CAD      Lab Results  Component Value Date   CHOL 152 09/24/2014   HDL 39.30 09/24/2014   LDLCALC 139* 07/22/2014   LDLDIRECT 72.0 09/24/2014   TRIG 230.0* 09/24/2014   CHOLHDL 4 09/24/2014                  The blood pressure has been treated with Aldactone since 2008.  Also on 10 mg Jardiance   Continues to take 100 mg Aldactone and potassium is stable Urine microalbumin has been normal    Lab Results  Component Value Date   CREATININE 0.71 03/30/2015   BUN 10 03/30/2015   NA 137 03/30/2015   K 4.0 03/30/2015   CL 102 03/30/2015   CO2 26 03/30/2015        Physical  Examination:  BP 128/82 mmHg  Pulse 90  Temp(Src) 97.7 F (36.5 C)  Resp 16  Ht 5\' 6"  (1.676 m)  Wt 280 lb 3.2 oz (127.098 kg)  BMI 45.25 kg/m2  SpO2 94%      Repeat blood pressure with large blood pressure cuff 128/82  No ankle edema  ASSESSMENT:  Diabetes type 2, uncontrolled    She has worsening control of her blood sugars with A1c now 7.6 She has been less compliant with her self-care in the last 3 months and her weight is starting to go up Also likely has post prandial readings Has not been motivated to exercise or check her sugar She previously has had difficulties watching her diet also and has been eating out on medications more  Discussed additional options for treatment; since she tends to get candidiasis will not increase her Jardiance She should benefit from trying a GLP-1 drug which would help her weight loss  better also; since she had nausea and vomiting with Victoza will start with Tanzeum and consider Bydureon.  However Bydureon and is not covered by insurance at this time  LIPIDS: She has had  hypercholesterolemia which is well-controlled with Zocor  Hypertension: Controlled with Aldactone 100 mg only.  PLAN:   Demonstrated how to use the Tanzeum injection been and given her a brochure on this.  Discussed how this works and the dosage as well as benefits and possible side effects, safety section discussed  She will start back on her walking program and also improve her diet  She will start using the One Touch monitor she has at home  Follow up in 2 months and will consider increasing the Tanzeum 250 mg   Patient Instructions  Check blood sugars on waking up .Marland Kitchen2-3  .Marland Kitchen times a week Also check blood sugars about 2 hours after a meal and do this after different meals by rotation  Recommended blood sugar levels on waking up is 90-130 and about 2 hours after meal is 140-180 Please bring blood sugar monitor to each visit.  Exercise daily  Tanzeum   weekly, stop Derrell Lolling when out   Counseling time on subjects discussed above is over 50% of today's 25 minute visit     Samaia Iwata 04/02/2015, 8:56 AM   Note: This office note was prepared with Insurance underwriter. Any transcriptional errors that result from this process are unintentional.

## 2015-04-02 NOTE — Patient Instructions (Addendum)
Check blood sugars on waking up .Marland Kitchen2-3  .Marland Kitchen times a week Also check blood sugars about 2 hours after a meal and do this after different meals by rotation  Recommended blood sugar levels on waking up is 90-130 and about 2 hours after meal is 140-180 Please bring blood sugar monitor to each visit.  Exercise daily  Tanzeum  weekly, stop Derrell Lolling when out

## 2015-04-08 ENCOUNTER — Other Ambulatory Visit: Payer: Self-pay | Admitting: *Deleted

## 2015-04-08 ENCOUNTER — Telehealth: Payer: Self-pay | Admitting: Endocrinology

## 2015-04-08 MED ORDER — ALBIGLUTIDE 30 MG ~~LOC~~ PEN: PEN_INJECTOR | SUBCUTANEOUS | Status: DC

## 2015-04-08 NOTE — Telephone Encounter (Signed)
See note below and please advise. Rx is not on current list.  Thanks!

## 2015-04-08 NOTE — Telephone Encounter (Signed)
Patient need a refill  Tanzeum send to,  [id number] [place], [place] - [address] [phone] (Phone) [phone] (Fax)

## 2015-04-08 NOTE — Telephone Encounter (Signed)
Needs prescription

## 2015-04-08 NOTE — Telephone Encounter (Signed)
rx sent

## 2015-05-04 ENCOUNTER — Ambulatory Visit: Payer: 59 | Admitting: Internal Medicine

## 2015-05-05 ENCOUNTER — Other Ambulatory Visit (INDEPENDENT_AMBULATORY_CARE_PROVIDER_SITE_OTHER): Payer: 59

## 2015-05-05 DIAGNOSIS — Z0000 Encounter for general adult medical examination without abnormal findings: Secondary | ICD-10-CM | POA: Diagnosis not present

## 2015-05-05 DIAGNOSIS — IMO0002 Reserved for concepts with insufficient information to code with codable children: Secondary | ICD-10-CM

## 2015-05-05 DIAGNOSIS — K219 Gastro-esophageal reflux disease without esophagitis: Secondary | ICD-10-CM

## 2015-05-05 DIAGNOSIS — E538 Deficiency of other specified B group vitamins: Secondary | ICD-10-CM | POA: Diagnosis not present

## 2015-05-05 DIAGNOSIS — E1165 Type 2 diabetes mellitus with hyperglycemia: Secondary | ICD-10-CM | POA: Diagnosis not present

## 2015-05-05 DIAGNOSIS — E669 Obesity, unspecified: Secondary | ICD-10-CM | POA: Diagnosis not present

## 2015-05-05 LAB — BASIC METABOLIC PANEL
BUN: 10 mg/dL (ref 6–23)
CALCIUM: 9.3 mg/dL (ref 8.4–10.5)
CO2: 25 mEq/L (ref 19–32)
CREATININE: 0.65 mg/dL (ref 0.40–1.20)
Chloride: 102 mEq/L (ref 96–112)
GFR: 127.3 mL/min (ref >60.00)
Glucose, Bld: 91 mg/dL (ref 70–99)
Potassium: 4 mEq/L (ref 3.5–5.1)
Sodium: 138 mEq/L (ref 135–145)

## 2015-05-05 LAB — CBC WITH DIFFERENTIAL/PLATELET
BASOS ABS: 0 10*3/uL (ref 0.0–0.1)
BASOS PCT: 0.4 % (ref 0.0–3.0)
EOS PCT: 1.7 % (ref 0.0–5.0)
Eosinophils Absolute: 0.1 10*3/uL (ref 0.0–0.7)
HEMATOCRIT: 31.9 % — AB (ref 36.0–46.0)
Hemoglobin: 9.9 g/dL — ABNORMAL LOW (ref 12.0–15.0)
LYMPHS PCT: 27.5 % (ref 12.0–46.0)
Lymphs Abs: 1.9 10*3/uL (ref 0.7–4.0)
MCHC: 31.1 g/dL (ref 30.0–36.0)
MCV: 69.9 fl — AB (ref 78.0–100.0)
MONOS PCT: 9.3 % (ref 3.0–12.0)
Monocytes Absolute: 0.6 10*3/uL (ref 0.1–1.0)
NEUTROS ABS: 4.1 10*3/uL (ref 1.4–7.7)
Neutrophils Relative %: 61.1 % (ref 43.0–77.0)
PLATELETS: 385 10*3/uL (ref 150.0–400.0)
RBC: 4.56 Mil/uL (ref 3.87–5.11)
RDW: 20.6 % — AB (ref 11.5–15.5)
WBC: 6.7 10*3/uL (ref 4.0–10.5)

## 2015-05-05 LAB — LIPID PANEL
CHOLESTEROL: 166 mg/dL (ref 0–200)
HDL: 40.4 mg/dL (ref >39.00)
LDL Cholesterol: 104 mg/dL — ABNORMAL HIGH (ref 0–99)
NonHDL: 125.63
Total CHOL/HDL Ratio: 4
Triglycerides: 107 mg/dL (ref 0.0–149.0)
VLDL: 21.4 mg/dL (ref 0.0–40.0)

## 2015-05-05 LAB — URINALYSIS
Bilirubin Urine: NEGATIVE
Ketones, ur: NEGATIVE
LEUKOCYTES UA: NEGATIVE
NITRITE: NEGATIVE
Specific Gravity, Urine: 1.025 (ref 1.000–1.030)
Urine Glucose: >=1000 — AB
Urobilinogen, UA: 0.2 (ref 0.0–1.0)
pH: 6 (ref 5.0–8.0)

## 2015-05-05 LAB — HEPATIC FUNCTION PANEL
ALT: 20 U/L (ref 0–35)
AST: 12 U/L (ref 0–37)
Albumin: 3.8 g/dL (ref 3.5–5.2)
Alkaline Phosphatase: 109 U/L (ref 39–117)
BILIRUBIN DIRECT: 0.1 mg/dL (ref 0.0–0.3)
Total Bilirubin: 0.7 mg/dL (ref 0.2–1.2)
Total Protein: 7.6 g/dL (ref 6.0–8.3)

## 2015-05-05 LAB — HEMOGLOBIN A1C: HEMOGLOBIN A1C: 7.5 % — AB (ref 4.6–6.5)

## 2015-05-05 LAB — VITAMIN B12: Vitamin B-12: 620 pg/mL (ref 211–911)

## 2015-05-05 LAB — VITAMIN D 25 HYDROXY (VIT D DEFICIENCY, FRACTURES): VITD: 11.11 ng/mL — AB (ref 30.00–100.00)

## 2015-05-05 LAB — TSH: TSH: 0.57 u[IU]/mL (ref 0.35–4.50)

## 2015-05-05 LAB — IBC PANEL
Iron: 50 ug/dL (ref 42–145)
SATURATION RATIOS: 9.4 % — AB (ref 20.0–50.0)
Transferrin: 381 mg/dL — ABNORMAL HIGH (ref 212.0–360.0)

## 2015-05-12 ENCOUNTER — Ambulatory Visit (INDEPENDENT_AMBULATORY_CARE_PROVIDER_SITE_OTHER): Payer: 59 | Admitting: Internal Medicine

## 2015-05-12 ENCOUNTER — Encounter: Payer: Self-pay | Admitting: Internal Medicine

## 2015-05-12 VITALS — BP 112/80 | HR 66 | Ht 66.0 in | Wt 279.0 lb

## 2015-05-12 DIAGNOSIS — E1100 Type 2 diabetes mellitus with hyperosmolarity without nonketotic hyperglycemic-hyperosmolar coma (NKHHC): Secondary | ICD-10-CM

## 2015-05-12 DIAGNOSIS — Z0000 Encounter for general adult medical examination without abnormal findings: Secondary | ICD-10-CM | POA: Diagnosis not present

## 2015-05-12 DIAGNOSIS — E559 Vitamin D deficiency, unspecified: Secondary | ICD-10-CM

## 2015-05-12 MED ORDER — ERGOCALCIFEROL 1.25 MG (50000 UT) PO CAPS: 50000.0000 [IU] | ORAL_CAPSULE | ORAL | Status: DC

## 2015-05-12 MED ORDER — VITAMIN D 1000 UNITS PO TABS: 1000.0000 [IU] | ORAL_TABLET | Freq: Every day | ORAL | Status: DC

## 2015-05-12 NOTE — Progress Notes (Signed)
Subjective:  Patient ID: Julia Estrada, female    DOB: 14-Feb-1971  Age: 44 y.o. MRN: [id number]  CC: Annual Exam   HPI Julia Estrada presents for a well exam  Outpatient Prescriptions Prior to Visit  Medication Sig Dispense Refill  . Albiglutide (TANZEUM) 30 MG PEN Inject contents of one pen once per week 4 each 3  . diphenhydrAMINE (BENADRYL) 25 MG tablet Take 1 tablet (25 mg total) by mouth every 6 (six) hours as needed for itching or allergies (hives). 100 tablet 1  . ferrous sulfate 325 (65 FE) MG tablet Take 325 mg by mouth daily with breakfast.    . fexofenadine (ALLEGRA) 180 MG tablet Take 180 mg by mouth daily as needed for allergies.     . fluconazole (DIFLUCAN) 150 MG tablet TAKE 1 TABLET BY MOUTH ONCE 1 tablet 1  . glucose blood (ONE TOUCH ULTRA TEST) test strip Use as instructed to check blood sugar 2 times per day dx code E11.65 100 each 3  . ibuprofen (ADVIL,MOTRIN) 800 MG tablet Take 800 mg by mouth every 8 (eight) hours as needed for fever, headache or mild pain.     Marland Kitchen JARDIANCE 10 MG TABS tablet Take 1 tablet by mouth  daily 90 tablet 1  . LORazepam (ATIVAN) 0.5 MG tablet Take 1 tablet (0.5 mg total) by mouth every 8 (eight) hours as needed for anxiety. 60 tablet 1  . metFORMIN (GLUCOPHAGE-XR) 500 MG 24 hr tablet Take 4 tablets by mouth  daily 360 tablet 1  . ONETOUCH DELICA LANCETS FINE MISC Use 2 per day dx code E11.65 100 each 2  . pantoprazole (PROTONIX) 40 MG tablet Take 1 tablet by mouth  daily 90 tablet 3  . ranitidine (ZANTAC) 300 MG tablet Take 1 tablet by mouth at  bedtime. 90 tablet 3  . simvastatin (ZOCOR) 20 MG tablet Take 1 tablet (20 mg total) by mouth at bedtime. 90 tablet 3  . spironolactone (ALDACTONE) 100 MG tablet Take 1 tablet by mouth  daily 90 tablet 3  . TRADJENTA 5 MG TABS tablet Take 1 tablet by mouth  daily 90 tablet 1  . valACYclovir (VALTREX) 1000 MG tablet Take 1,000 mg by mouth daily.     . vitamin B-12 (CYANOCOBALAMIN) 1000 MCG tablet Take  1,000 mcg by mouth daily.    Marland Kitchen scopolamine (TRANSDERM-SCOP, 1.5 MG,) 1 MG/3DAYS Place 1 patch (1.5 mg total) onto the skin every 3 (three) days. (Patient not taking: Reported on 05/12/2015) 4 patch 1  . triamcinolone cream (KENALOG) 0.5 % Apply 1 application topically 3 (three) times daily. On rash (Patient not taking: Reported on 05/12/2015) 90 g 1  . clindamycin (CLEOCIN) 300 MG capsule Take 1 capsule (300 mg total) by mouth 4 (four) times daily. (Patient not taking: Reported on 05/12/2015) 28 capsule 0   No facility-administered medications prior to visit.    ROS Review of Systems  Constitutional: Negative for chills, activity change, appetite change, fatigue and unexpected weight change.  HENT: Negative for congestion, mouth sores and sinus pressure.   Eyes: Negative for visual disturbance.  Respiratory: Negative for cough and chest tightness.   Gastrointestinal: Negative for nausea and abdominal pain.  Genitourinary: Negative for frequency, difficulty urinating and vaginal pain.  Musculoskeletal: Negative for back pain and gait problem.  Skin: Negative for pallor and rash.  Neurological: Negative for dizziness, tremors, weakness, numbness and headaches.  Psychiatric/Behavioral: Negative for suicidal ideas, confusion and sleep disturbance.    Objective:  BP 112/80 mmHg  Pulse 66  Ht  (1.676 m)  Wt 279 lb (126.554 kg)  BMI 45.05 kg/m2  SpO2 95%  BP Readings from Last 3 Encounters:  05/12/15 112/80  04/02/15 128/82  01/09/15 140/97    Wt Readings from Last 3 Encounters:  05/12/15 279 lb (126.554 kg)  04/02/15 280 lb 3.2 oz (127.098 kg)  12/31/14 276 lb (125.193 kg)    Physical Exam  Constitutional: She appears well-developed. No distress.  HENT:  Head: Normocephalic.  Right Ear: External ear normal.  Left Ear: External ear normal.  Nose: Nose normal.  Mouth/Throat: Oropharynx is clear and moist.  Eyes: Conjunctivae are normal. Pupils are equal, round, and  reactive to light. Right eye exhibits no discharge. Left eye exhibits no discharge.  Neck: Normal range of motion. Neck supple. No JVD present. No tracheal deviation present. No thyromegaly present.  Cardiovascular: Normal rate, regular rhythm and normal heart sounds.   Pulmonary/Chest: No stridor. No respiratory distress. She has no wheezes.  Abdominal: Soft. Bowel sounds are normal. She exhibits no distension and no mass. There is no tenderness. There is no rebound and no guarding.  Musculoskeletal: She exhibits no edema or tenderness.  Lymphadenopathy:    She has no cervical adenopathy.  Neurological: She displays normal reflexes. No cranial nerve deficit. She exhibits normal muscle tone. Coordination normal.  Skin: No rash noted. No erythema.  Psychiatric: She has a normal mood and affect. Her behavior is normal. Judgment and thought content normal.  Obese  Lab Results  Component Value Date   WBC 6.7 05/05/2015   HGB 9.9* 05/05/2015   HCT 31.9* 05/05/2015   PLT 385.0 05/05/2015   GLUCOSE 91 05/05/2015   CHOL 166 05/05/2015   TRIG 107.0 05/05/2015   HDL 40.40 05/05/2015   LDLDIRECT 72.0 09/24/2014   LDLCALC 104* 05/05/2015   ALT 20 05/05/2015   AST 12 05/05/2015   NA 138 05/05/2015   K 4.0 05/05/2015   CL 102 05/05/2015   CREATININE 0.65 05/05/2015   BUN 10 05/05/2015   CO2 25 05/05/2015   TSH 0.57 05/05/2015   INR 1.02 04/22/2014   HGBA1C 7.5* 05/05/2015   MICROALBUR 1.7 12/24/2014    No results found.  Assessment & Plan:   Julia Estrada was seen today for annual exam.  Diagnoses and all orders for this visit:  Vitamin D deficiency  Well adult exam  Uncontrolled type 2 diabetes mellitus with hyperosmolarity without coma, without long-term current use of insulin (HCC)  Other orders -     ergocalciferol (VITAMIN D2) 50000 UNITS capsule; Take 1 capsule (50,000 Units total) by mouth once a week. -     cholecalciferol (VITAMIN D) 1000 UNITS tablet; Take 1 tablet (1,000  Units total) by mouth daily.  I have discontinued [name]'s clindamycin. I am also having her start on ergocalciferol and cholecalciferol. Additionally, I am having her maintain her ibuprofen, ferrous sulfate, vitamin B-12, diphenhydrAMINE, triamcinolone cream, valACYclovir, fexofenadine, spironolactone, ranitidine, glucose blood, ONETOUCH DELICA LANCETS FINE, pantoprazole, simvastatin, LORazepam, JARDIANCE, metFORMIN, TRADJENTA, scopolamine, fluconazole, and Albiglutide.  Meds ordered this encounter  Medications  . ergocalciferol (VITAMIN D2) 50000 UNITS capsule    Sig: Take 1 capsule (50,000 Units total) by mouth once a week.    Dispense:  6 capsule    Refill:  0  . cholecalciferol (VITAMIN D) 1000 UNITS tablet    Sig: Take 1 tablet (1,000 Units total) by mouth daily.    Dispense:  100 tablet  Refill:  3     Follow-up: Return in about 1 year (around 05-25-2016) for Wellness Exam.  Sonda Primes, MD

## 2015-05-12 NOTE — Assessment & Plan Note (Signed)
We discussed age appropriate health related issues, including available/recomended screening tests and vaccinations. We discussed a need for adhering to healthy diet and exercise. Labs/EKG were reviewed/ordered. All questions were answered.   

## 2015-05-12 NOTE — Assessment & Plan Note (Signed)
Re-start Vit D 

## 2015-05-12 NOTE — Assessment & Plan Note (Signed)
Better  

## 2015-05-12 NOTE — Progress Notes (Signed)
Pre visit review using our clinic review tool, if applicable. No additional management support is needed unless otherwise documented below in the visit note. 

## 2015-05-22 ENCOUNTER — Ambulatory Visit: Payer: 59 | Admitting: Internal Medicine

## 2015-06-02 ENCOUNTER — Other Ambulatory Visit (INDEPENDENT_AMBULATORY_CARE_PROVIDER_SITE_OTHER): Payer: 59

## 2015-06-02 DIAGNOSIS — E1165 Type 2 diabetes mellitus with hyperglycemia: Secondary | ICD-10-CM | POA: Diagnosis not present

## 2015-06-02 DIAGNOSIS — IMO0002 Reserved for concepts with insufficient information to code with codable children: Secondary | ICD-10-CM

## 2015-06-02 LAB — COMPREHENSIVE METABOLIC PANEL
ALT: 22 U/L (ref 0–35)
AST: 11 U/L (ref 0–37)
Albumin: 3.6 g/dL (ref 3.5–5.2)
Alkaline Phosphatase: 102 U/L (ref 39–117)
BILIRUBIN TOTAL: 0.7 mg/dL (ref 0.2–1.2)
BUN: 12 mg/dL (ref 6–23)
CHLORIDE: 103 meq/L (ref 96–112)
CO2: 26 meq/L (ref 19–32)
CREATININE: 0.72 mg/dL (ref 0.40–1.20)
Calcium: 9.4 mg/dL (ref 8.4–10.5)
GFR: 113.09 mL/min (ref >60.00)
GLUCOSE: 99 mg/dL (ref 70–99)
Potassium: 3.9 mEq/L (ref 3.5–5.1)
Sodium: 139 mEq/L (ref 135–145)
Total Protein: 7.5 g/dL (ref 6.0–8.3)

## 2015-06-03 LAB — FRUCTOSAMINE: FRUCTOSAMINE: 207 umol/L (ref 0–285)

## 2015-06-05 ENCOUNTER — Ambulatory Visit (INDEPENDENT_AMBULATORY_CARE_PROVIDER_SITE_OTHER): Payer: 59 | Admitting: Endocrinology

## 2015-06-05 ENCOUNTER — Ambulatory Visit: Payer: 59 | Admitting: Endocrinology

## 2015-06-05 ENCOUNTER — Encounter: Payer: Self-pay | Admitting: Endocrinology

## 2015-06-05 ENCOUNTER — Other Ambulatory Visit: Payer: Self-pay | Admitting: *Deleted

## 2015-06-05 VITALS — BP 120/76 | HR 80 | Temp 97.8°F | Resp 16 | Ht 66.0 in | Wt 279.0 lb

## 2015-06-05 DIAGNOSIS — Z794 Long term (current) use of insulin: Secondary | ICD-10-CM

## 2015-06-05 DIAGNOSIS — E1165 Type 2 diabetes mellitus with hyperglycemia: Secondary | ICD-10-CM | POA: Diagnosis not present

## 2015-06-05 MED ORDER — ALBIGLUTIDE 30 MG ~~LOC~~ PEN: PEN_INJECTOR | SUBCUTANEOUS | Status: DC

## 2015-06-05 MED ORDER — ONETOUCH DELICA LANCETS FINE MISC: Status: DC

## 2015-06-05 MED ORDER — GLUCOSE BLOOD VI STRP: ORAL_STRIP | Status: DC

## 2015-06-05 MED ORDER — ALBIGLUTIDE 50 MG ~~LOC~~ PEN: PEN_INJECTOR | SUBCUTANEOUS | Status: DC

## 2015-06-05 NOTE — Progress Notes (Signed)
Patient ID: Julia Estrada, female   DOB: [birth year], 44 y.o.   MRN: [id number]           Reason for Appointment: Follow-up for Type 2 Diabetes  History of Present Illness:          Diagnosis: Type 2 diabetes mellitus, date of diagnosis: ?  2011        Past history:  Not clear when her diagnosis was first made but she probably did not have very high sugars. She was initially treated with metformin which was titrated to 2000 mg a day without side effects She was also sent to the diabetes classes at the time of diagnosis At some point she was also given Januvia which has been continued She was also tried on Victoza in 08/2012 but she could not tolerate it for more than a week because of significant nausea and vomiting.  She thinks she may have taken only the 0.6 mg dose Since in 2015 her A1c had been over 8% she was started on Jardiance  Recent history:   Oral hypoglycemic drugs the patient is taking are: Jardiance 10 mg, Tradgenta, metformin ER 2 g      Since her A1c had been consistently over 7% she was started on Tanzeum on her last visit in 9/16 She has taken 30 mg weekly without any nausea which she had with Victoza Her fructosamine is low normal indicating good control However she has not had a glucose monitor recently and does not know accurately what her sugars have been  Current blood sugar patterns and problems identified:  By recall she thinks that her blood sugars are near 100 in the morning and 160-190 after meals but not clear how often she is checking  Lab glucose was 99 fasting  She has not been walking which she was doing before because of moving and packing.  She still has difficulty losing weight.       Side effects from medications have been: Vomiting with Victoza which she tried for one week only     Compliance with the medical regimen: Fair  Hypoglycemia:   none  Glucose monitoring: Rarely   Blood Glucose readings f 110 pc 160-199     Self-care: The diet  that the patient has been following is: none, does avoid sweets, may not always control portions or high-fat foods Meals: 3 meals per day. Breakfast is oatmeal or biscuit,   lunch is chicken or pork chop  Has fruit cups chips or popcorn for snacks        Exercise: Has not been walking, previously was doing 2-3/7 , 30 min at a time Dietician visit, most recent: 1/16              Weight history: Previous range 276 -309  Wt Readings from Last 3 Encounters:  06/05/15 279 lb (126.554 kg)  05/12/15 279 lb (126.554 kg)  04/02/15 280 lb 3.2 oz (127.098 kg)    Glycemic control:   Lab Results  Component Value Date   HGBA1C 7.5* 05/05/2015   HGBA1C 7.6* 03/30/2015   HGBA1C 7.4* 12/24/2014   Lab Results  Component Value Date   MICROALBUR 1.7 12/24/2014   LDLCALC 104* 05/05/2015   CREATININE 0.72 06/02/2015         Medication List       This list is accurate as of: 06/05/15  3:43 PM.  Always use your most recent med list.  Albiglutide 30 MG Pen  Commonly known as:  TANZEUM  Inject contents of one pen once per week     cholecalciferol 1000 UNITS tablet  Commonly known as:  VITAMIN D  Take 1 tablet (1,000 Units total) by mouth daily.     diphenhydrAMINE 25 MG tablet  Commonly known as:  BENADRYL  Take 1 tablet (25 mg total) by mouth every 6 (six) hours as needed for itching or allergies (hives).     ergocalciferol 50000 UNITS capsule  Commonly known as:  VITAMIN D2  Take 1 capsule (50,000 Units total) by mouth once a week.     ferrous sulfate 325 (65 FE) MG tablet  Take 325 mg by mouth daily with breakfast.     fexofenadine 180 MG tablet  Commonly known as:  ALLEGRA  Take 180 mg by mouth daily as needed for allergies.     fluconazole 150 MG tablet  Commonly known as:  DIFLUCAN  TAKE 1 TABLET BY MOUTH ONCE     glucose blood test strip  Commonly known as:  ONE TOUCH ULTRA TEST  Use as instructed to check blood sugar 2 times per day dx code E11.65       ibuprofen 800 MG tablet  Commonly known as:  ADVIL,MOTRIN  Take 800 mg by mouth every 8 (eight) hours as needed for fever, headache or mild pain.     JARDIANCE 10 MG Tabs tablet  Generic drug:  empagliflozin  Take 1 tablet by mouth  daily     metFORMIN 500 MG 24 hr tablet  Commonly known as:  GLUCOPHAGE-XR  Take 4 tablets by mouth  daily     ONETOUCH DELICA LANCETS FINE Misc  Use 2 per day dx code E11.65     pantoprazole 40 MG tablet  Commonly known as:  PROTONIX  Take 1 tablet by mouth  daily     ranitidine 300 MG tablet  Commonly known as:  ZANTAC  Take 1 tablet by mouth at  bedtime.     scopolamine 1 MG/3DAYS  Commonly known as:  TRANSDERM-SCOP (1.5 MG)  Place 1 patch (1.5 mg total) onto the skin every 3 (three) days.     simvastatin 20 MG tablet  Commonly known as:  ZOCOR  Take 1 tablet (20 mg total) by mouth at bedtime.     spironolactone 100 MG tablet  Commonly known as:  ALDACTONE  Take 1 tablet by mouth  daily     triamcinolone cream 0.5 %  Commonly known as:  KENALOG  Apply 1 application topically 3 (three) times daily. On rash     valACYclovir 1000 MG tablet  Commonly known as:  VALTREX  Take 1,000 mg by mouth daily.     vitamin B-12 1000 MCG tablet  Commonly known as:  CYANOCOBALAMIN  Take 1,000 mcg by mouth daily.        Allergies:  Allergies  Allergen Reactions  . Codeine Sulfate Nausea And Vomiting  . Hydrochlorothiazide W-Triamterene     REACTION: cramping  . Oxycodone-Acetaminophen Nausea And Vomiting  . Prednisone Hives  . Victoza [Liraglutide]     n/v    Past Medical History  Diagnosis Date  . Anemia     iron deficiency  . Obesity     BMI=44  . Vitamin B12 deficiency   . Vitamin D deficiency   . Depression 2011  . Diabetes mellitus 2010    type II  . GERD (gastroesophageal reflux disease) 2004  . Hypertension  2008  . Hyperlipidemia   . Shortness of breath     with exercise  . Anxiety   . Sleep apnea     uses CPAP  every night    Past Surgical History  Procedure Laterality Date  . Fibroidectomy  2002    laparotomy for removal of fibroids  . Tonsillectomy and adenoidectomy    . Cesarean section  2004    x 1  . Myomectomy  2002  . Shoulder arthroscopy Left 04/30/2014    Procedure: Left Shoulder Arthroscopy and Debridement;  Surgeon: Nadara MustardMarcus Duda V, MD;  [facility];  Service: Orthopedics;  Laterality: Left;  . Wisdom tooth extraction    . Upper gi endoscopy    . Colonoscopy    . Dilitation & currettage/hystroscopy with novasure ablation N/A 01/09/2015    Procedure: DILATATION & CURETTAGE/HYSTEROSCOPY WITH NOVASURE ABLATION;  Surgeon: Donovan KailAllen Ross, MD;  Location: [facility] ORS;  Service: Gynecology;  Laterality: N/A;    Family History  Problem Relation Age of Onset  . Hypertension Mother     father, sisters  . Diabetes Mother     sister  . Kidney cancer Mother   . Cancer Mother   . Cholelithiasis Sister   . Clotting disorder Daughter   . Heart disease Maternal Uncle   . Heart disease Maternal Grandfather     Social History:  reports that she has never smoked. She has never used smokeless tobacco. She reports that she drinks alcohol. She reports that she does not use illicit drugs.    Review of Systems        Lipids:  Her LDL is treated by PCP with   Zocor She does have a family history of CAD      Lab Results  Component Value Date   CHOL 166 05/05/2015   HDL 40.40 05/05/2015   LDLCALC 104* 05/05/2015   LDLDIRECT 72.0 09/24/2014   TRIG 107.0 05/05/2015   CHOLHDL 4 05/05/2015                  The blood pressure has been treated with Aldactone since 2008.  Also on 10 mg Jardiance   Continues to take 100 mg Aldactone and potassium is stable Urine microalbumin has been normal    Lab Results  Component Value Date   CREATININE 0.72 06/02/2015   BUN 12 06/02/2015   NA 139 06/02/2015   K 3.9 06/02/2015   CL 103 06/02/2015   CO2 26 06/02/2015        Physical Examination:  BP  120/76 mmHg  Pulse 80  Temp(Src) 97.8 F (36.6 C)  Resp 16  Ht 5\' 6"  (1.676 m)  Wt 279 lb (126.554 kg)  BMI 45.05 kg/m2  SpO2 96%        ASSESSMENT:  Diabetes type 2, uncontrolled    She has improvement in her blood sugar control with using Tanzeum See history of present illness for detailed discussion of his current management, blood sugar patterns and problems identified Although her blood sugars have not been monitored much at home and she did not bring any meter for download her fructosamine indicates excellent control However she has difficulty losing weight Currently not exercising  Hypertension: Controlled with Aldactone 100 mg and electrolytes are stable  PLAN:   Increase Tanzeum to 50 mg  Stop Tradgenta  More consistent blood sugar monitoring and she will be given a One Touch Verio monitor today  Recheck A1c in 3 months   Patient Instructions  Check blood sugars on waking up 3  times a week Also check blood sugars about 2 hours after a meal and do this after different meals by rotation  Recommended blood sugar levels on waking up is 90-130 and about 2 hours after meal is 130-160  Please bring your blood sugar monitor to each visit, thank you  Tanzeum  annd stop Kyung Bacca 06/05/2015, 3:43 PM   Note: This office note was prepared with Dragon voice recognition system technology. Any transcriptional errors that result from this process are unintentional.

## 2015-06-05 NOTE — Patient Instructions (Signed)
Check blood sugars on waking up 3  times a week Also check blood sugars about 2 hours after a meal and do this after different meals by rotation  Recommended blood sugar levels on waking up is 90-130 and about 2 hours after meal is 130-160  Please bring your blood sugar monitor to each visit, thank you  Tanzeum 50mg  annd stop [place]

## 2015-06-09 ENCOUNTER — Other Ambulatory Visit: Payer: Self-pay | Admitting: Internal Medicine

## 2015-07-10 ENCOUNTER — Ambulatory Visit (INDEPENDENT_AMBULATORY_CARE_PROVIDER_SITE_OTHER): Payer: 59 | Admitting: Podiatry

## 2015-07-10 ENCOUNTER — Encounter: Payer: Self-pay | Admitting: Podiatry

## 2015-07-10 VITALS — BP 128/88 | HR 84 | Resp 16 | Ht 66.0 in | Wt 277.0 lb

## 2015-07-10 DIAGNOSIS — B351 Tinea unguium: Secondary | ICD-10-CM | POA: Diagnosis not present

## 2015-07-10 DIAGNOSIS — L600 Ingrowing nail: Secondary | ICD-10-CM | POA: Diagnosis not present

## 2015-07-10 NOTE — Progress Notes (Signed)
   Subjective:    Patient ID: Julia Estrada, female    DOB: 03/28/1971, 44 y.o.   MRN: [id number]  HPI Patient presents with a bilateral nail problem; great toes. Pt stated, "nail is cracking down middle of nail".  Pt diabetic type 2; sugar=did not take today; A1C=7.4.   Review of Systems  All other systems reviewed and are negative.      Objective:   Physical Exam        Assessment & Plan:

## 2015-07-12 NOTE — Progress Notes (Signed)
Subjective:     Patient ID: Julia Estrada, female   DOB: 05/03/1971, 44 y.o.   MRN: [id number]  HPI long-term diabetic concerned about cracking of her big toenails bilateral with no current drainage according to patient   Review of Systems  All other systems reviewed and are negative.      Objective:   Physical Exam  Constitutional: She is oriented to person, place, and time.  Cardiovascular: Intact distal pulses.   Musculoskeletal: Normal range of motion.  Neurological: She is oriented to person, place, and time.  Skin: Skin is warm and dry.  Nursing note and vitals reviewed.  neurovascular status found to be intact muscle strength adequate with patient having a thickened hallux nails bilateral with cracking down the middle but no current redness drainage or ingrown toenail component. 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 We'll utilize a topical and do not recommend anything more aggressive currently and less redness drainage or looseness of the nail were to occur

## 2015-07-29 ENCOUNTER — Ambulatory Visit (INDEPENDENT_AMBULATORY_CARE_PROVIDER_SITE_OTHER): Payer: 59 | Admitting: Internal Medicine

## 2015-07-29 VITALS — BP 128/80 | HR 91 | Temp 97.8°F | Ht 66.0 in | Wt 276.0 lb

## 2015-07-29 DIAGNOSIS — J019 Acute sinusitis, unspecified: Secondary | ICD-10-CM | POA: Diagnosis not present

## 2015-07-29 DIAGNOSIS — E1100 Type 2 diabetes mellitus with hyperosmolarity without nonketotic hyperglycemic-hyperosmolar coma (NKHHC): Secondary | ICD-10-CM | POA: Diagnosis not present

## 2015-07-29 DIAGNOSIS — J3089 Other allergic rhinitis: Secondary | ICD-10-CM | POA: Diagnosis not present

## 2015-07-29 MED ORDER — HYDROCODONE-HOMATROPINE 5-1.5 MG/5ML PO SYRP: 5.0000 mL | ORAL_SOLUTION | Freq: Four times a day (QID) | ORAL | Status: DC | PRN

## 2015-07-29 MED ORDER — AZITHROMYCIN 250 MG PO TABS: ORAL_TABLET | ORAL | Status: DC

## 2015-07-29 NOTE — Patient Instructions (Signed)
Please take all new medication as prescribed - the antibiotic, and cough medicine if needed  Please continue all other medications as before, and refills have been done if requested.  Please have the pharmacy call with any other refills you may need.  Please continue your efforts at being more active, low cholesterol diet, and weight control.  Please keep your appointments with your specialists as you may have planned   

## 2015-07-29 NOTE — Assessment & Plan Note (Signed)
Mild to mod, for otc zyrtec prn,  to f/u any worsening symptoms or concerns ?

## 2015-07-29 NOTE — Assessment & Plan Note (Signed)
stable overall by history and exam, recent data reviewed with pt, and pt to continue medical treatment as before,  to f/u any worsening symptoms or concerns Lab Results  Component Value Date   HGBA1C 7.5* 05/05/2015   For pt f/u for any cbg > 200 or onset worsening polys

## 2015-07-29 NOTE — Progress Notes (Signed)
Subjective:    Patient ID: Julia Estrada, female    DOB: 06/13/1971, 44 y.o.   MRN: [id number]  HPI   Here with 2-3 days acute onset fever, facial pain, pressure, headache, general weakness and malaise, and greenish d/c, with mild ST and cough, but pt denies chest pain, wheezing, increased sob or doe, orthopnea, PND, increased LE swelling, palpitations, dizziness or syncope.  Does have several wks ongoing nasal allergy symptoms with clearish congestion, itch and sneezing, without fever, pain, ST, cough, swelling or wheezing.   Pt denies polydipsia, polyuria, sees Dr Lucianne MussKumar for DM Pt denies new neurological symptoms such as new headache, or facial or extremity weakness or numbness Past Medical History  Diagnosis Date  . Anemia     iron deficiency  . Obesity     BMI=44  . Vitamin B12 deficiency   . Vitamin D deficiency   . Depression 2011  . Diabetes mellitus 2010    type II  . GERD (gastroesophageal reflux disease) 2004  . Hypertension 2008  . Hyperlipidemia   . Shortness of breath     with exercise  . Anxiety   . Sleep apnea     uses CPAP every night   Past Surgical History  Procedure Laterality Date  . Fibroidectomy  2002    laparotomy for removal of fibroids  . Tonsillectomy and adenoidectomy    . Cesarean section  2004    x 1  . Myomectomy  2002  . Shoulder arthroscopy Left 04/30/2014    Procedure: Left Shoulder Arthroscopy and Debridement;  Surgeon: Nadara MustardMarcus Duda V, MD;  [facility];  Service: Orthopedics;  Laterality: Left;  . Wisdom tooth extraction    . Upper gi endoscopy    . Colonoscopy    . Dilitation & currettage/hystroscopy with novasure ablation N/A 01/09/2015    Procedure: DILATATION & CURETTAGE/HYSTEROSCOPY WITH NOVASURE ABLATION;  Surgeon: Donovan KailAllen Ross, MD;  Location: [facility] ORS;  Service: Gynecology;  Laterality: N/A;    reports that she has never smoked. She has never used smokeless tobacco. She reports that she drinks alcohol. She reports that she does not use  illicit drugs. family history includes Cancer in her mother; Cholelithiasis in her sister; Clotting disorder in her daughter; Diabetes in her mother; Heart disease in her maternal grandfather and maternal uncle; Hypertension in her mother; Kidney cancer in her mother. Allergies  Allergen Reactions  . Codeine Sulfate Nausea And Vomiting  . Hydrochlorothiazide W-Triamterene     REACTION: cramping  . Oxycodone-Acetaminophen Nausea And Vomiting  . Prednisone Hives  . Victoza [Liraglutide]     n/v   Current Outpatient Prescriptions on File Prior to Visit  Medication Sig Dispense Refill  . Albiglutide (TANZEUM) 50 MG PEN Inject the contents of one pen once per week 12 each 1  . cholecalciferol (VITAMIN D) 1000 UNITS tablet Take 1 tablet (1,000 Units total) by mouth daily. 100 tablet 3  . diphenhydrAMINE (BENADRYL) 25 MG tablet Take 1 tablet (25 mg total) by mouth every 6 (six) hours as needed for itching or allergies (hives). 100 tablet 1  . ergocalciferol (VITAMIN D2) 50000 UNITS capsule Take 1 capsule (50,000 Units total) by mouth once a week. 6 capsule 0  . ferrous sulfate 325 (65 FE) MG tablet Take 325 mg by mouth daily with breakfast.    . fexofenadine (ALLEGRA) 180 MG tablet Take 180 mg by mouth daily as needed for allergies.     . fluconazole (DIFLUCAN) 150 MG tablet TAKE  1 TABLET BY MOUTH ONCE 1 tablet 1  . glucose blood (ONETOUCH VERIO) test strip Use as instructed to check blood sugar twice a day dx code E11.65 200 each 1  . ibuprofen (ADVIL,MOTRIN) 800 MG tablet Take 800 mg by mouth every 8 (eight) hours as needed for fever, headache or mild pain.     Marland Kitchen JARDIANCE 10 MG TABS tablet Take 1 tablet by mouth  daily 90 tablet 1  . metFORMIN (GLUCOPHAGE-XR) 500 MG 24 hr tablet Take 4 tablets by mouth  daily 360 tablet 1  . ONETOUCH DELICA LANCETS FINE MISC Use 2 per day dx code E11.65 200 each 1  . pantoprazole (PROTONIX) 40 MG tablet Take 1 tablet by mouth  daily 90 tablet 3  . ranitidine  (ZANTAC) 300 MG tablet Take 1 tablet by mouth at  bedtime 90 tablet 3  . scopolamine (TRANSDERM-SCOP, 1.5 MG,) 1 MG/3DAYS Place 1 patch (1.5 mg total) onto the skin every 3 (three) days. 4 patch 1  . simvastatin (ZOCOR) 20 MG tablet Take 1 tablet (20 mg total) by mouth at bedtime. 90 tablet 3  . spironolactone (ALDACTONE) 100 MG tablet Take 1 tablet by mouth  daily 90 tablet 3  . triamcinolone cream (KENALOG) 0.5 % Apply 1 application topically 3 (three) times daily. On rash 90 g 1  . valACYclovir (VALTREX) 1000 MG tablet Take 1,000 mg by mouth daily.     . vitamin B-12 (CYANOCOBALAMIN) 1000 MCG tablet Take 1,000 mcg by mouth daily.     No current facility-administered medications on file prior to visit.   Review of Systems  Constitutional: Negative for unusual diaphoresis or night sweats HENT: Negative for ringing in ear or discharge Eyes: Negative for double vision or worsening visual disturbance.  Respiratory: Negative for choking and stridor.   Gastrointestinal: Negative for vomiting or other signifcant bowel change Genitourinary: Negative for hematuria or change in urine volume.  Musculoskeletal: Negative for other MSK pain or swelling Skin: Negative for color change and worsening wound.  Neurological: Negative for tremors and numbness other than noted  Psychiatric/Behavioral: Negative for decreased concentration or agitation other than above       Objective:   Physical Exam BP 128/80 mmHg  Pulse 91  Temp(Src) 97.8 F (36.6 C) (Oral)  Ht  (1.676 m)  Wt 276 lb (125.193 kg)  BMI 44.57 kg/m2  SpO2 97% VS noted, mild ill Constitutional: Pt appears in no significant distress HENT: Head: NCAT.  Right Ear: External ear normal.  Left Ear: External ear normal.  Bilat tm's with mild erythema.  Max sinus areas mild tender.  Pharynx with mild erythema, no exudate Eyes: . Pupils are equal, round, and reactive to light. Conjunctivae and EOM are normal Neck: Normal range of motion.  Neck supple.  Cardiovascular: Normal rate and regular rhythm.   Pulmonary/Chest: Effort normal and breath sounds without rales or wheezing.  Neurological: Pt is alert. Not confused , motor grossly intact Skin: Skin is warm. No rash, no LE edema Psychiatric: Pt behavior is normal. No agitation.     Assessment & Plan:

## 2015-07-29 NOTE — Assessment & Plan Note (Signed)
Mild to mod, for antibx course,  to f/u any worsening symptoms or concerns, also cough med prn use

## 2015-07-29 NOTE — Progress Notes (Signed)
Pre visit review using our clinic review tool, if applicable. No additional management support is needed unless otherwise documented below in the visit note. 

## 2015-08-02 ENCOUNTER — Other Ambulatory Visit: Payer: Self-pay | Admitting: Endocrinology

## 2015-08-04 ENCOUNTER — Encounter: Payer: Self-pay | Admitting: Internal Medicine

## 2015-08-04 ENCOUNTER — Ambulatory Visit (INDEPENDENT_AMBULATORY_CARE_PROVIDER_SITE_OTHER): Payer: 59 | Admitting: Internal Medicine

## 2015-08-04 VITALS — BP 118/80 | HR 86 | Temp 97.9°F | Ht 66.0 in | Wt 278.0 lb

## 2015-08-04 DIAGNOSIS — E1100 Type 2 diabetes mellitus with hyperosmolarity without nonketotic hyperglycemic-hyperosmolar coma (NKHHC): Secondary | ICD-10-CM | POA: Diagnosis not present

## 2015-08-04 DIAGNOSIS — R05 Cough: Secondary | ICD-10-CM | POA: Diagnosis not present

## 2015-08-04 DIAGNOSIS — R062 Wheezing: Secondary | ICD-10-CM | POA: Insufficient documentation

## 2015-08-04 DIAGNOSIS — R059 Cough, unspecified: Secondary | ICD-10-CM

## 2015-08-04 MED ORDER — HYDROCODONE-HOMATROPINE 5-1.5 MG/5ML PO SYRP: 5.0000 mL | ORAL_SOLUTION | Freq: Four times a day (QID) | ORAL | Status: DC | PRN

## 2015-08-04 MED ORDER — LEVOFLOXACIN 500 MG PO TABS: 500.0000 mg | ORAL_TABLET | Freq: Every day | ORAL | Status: DC

## 2015-08-04 MED ORDER — METHYLPREDNISOLONE ACETATE 80 MG/ML IJ SUSP
80.0000 mg | Freq: Once | INTRAMUSCULAR | Status: AC
  Administered 2015-08-04: 80 mg via INTRAMUSCULAR

## 2015-08-04 MED ORDER — ALBUTEROL SULFATE HFA 108 (90 BASE) MCG/ACT IN AERS: 2.0000 | INHALATION_SPRAY | Freq: Four times a day (QID) | RESPIRATORY_TRACT | Status: DC | PRN

## 2015-08-04 MED ORDER — METHYLPREDNISOLONE 4 MG PO TBPK: ORAL_TABLET | ORAL | Status: DC

## 2015-08-04 NOTE — Patient Instructions (Signed)
You had the steroid shot today  Please take all new medication as prescribed - the antibiotic, cough medicine if needed, medrol dose pak, and the inhaler as needed  Please continue all other medications as before, and refills have been done if requested.  Please have the pharmacy call with any other refills you may need.  Please keep your appointments with your specialists as you may have planned  Please go to the XRAY Department in the Basement (go straight as you get off the elevator) for the x-ray testing Tomorrow  You will be contacted by phone if any changes need to be made immediately.  Otherwise, you will receive a letter about your results with an explanation, but please check with MyChart first.  Please remember to sign up for MyChart if you have not done so, as this will be important to you in the future with finding out test results, communicating by private email, and scheduling acute appointments online when needed.

## 2015-08-04 NOTE — Assessment & Plan Note (Signed)
Worsening, ? bornchitis vs pna, for cxr in am, Mild to mod, for antibx course,  to f/u any worsening symptoms or concerns

## 2015-08-04 NOTE — Assessment & Plan Note (Signed)
Mild to mod, for medrol dose pac,,  to f/u any worsening symptoms or concerns

## 2015-08-04 NOTE — Progress Notes (Signed)
Pre visit review using our clinic review tool, if applicable. No additional management support is needed unless otherwise documented below in the visit note. 

## 2015-08-04 NOTE — Progress Notes (Signed)
Subjective:    Patient ID: Julia Estrada, female    DOB: March 19, 1971, 45 y.o.   MRN: [id number]  HPI  Here to f/u, unfortunately some worsening despite recent zpack, now Here with acute onset mild to mod 2-3 days ST, HA, general weakness and malaise, with prod cough greenish sputum, but Pt denies chest pain, orthopnea, PND, increased LE swelling, palpitations, dizziness or syncope, but did have onset wheezing and sob/mild doe for 1 - days Pt denies new neurological symptoms such as new headache, or facial or extremity weakness or numbness   Pt denies polydipsia, polyuria Past Medical History  Diagnosis Date  . Anemia     iron deficiency  . Obesity     BMI=44  . Vitamin B12 deficiency   . Vitamin D deficiency   . Depression 2011  . Diabetes mellitus 2010    type II  . GERD (gastroesophageal reflux disease) 2004  . Hypertension 2008  . Hyperlipidemia   . Shortness of breath     with exercise  . Anxiety   . Sleep apnea     uses CPAP every night   Past Surgical History  Procedure Laterality Date  . Fibroidectomy  2002    laparotomy for removal of fibroids  . Tonsillectomy and adenoidectomy    . Cesarean section  2004    x 1  . Myomectomy  2002  . Shoulder arthroscopy Left 04/30/2014    Procedure: Left Shoulder Arthroscopy and Debridement;  Surgeon: Nadara Mustard, MD;  Location: [facility] OR;  Service: Orthopedics;  Laterality: Left;  . Wisdom tooth extraction    . Upper gi endoscopy    . Colonoscopy    . Dilitation & currettage/hystroscopy with novasure ablation N/A 01/09/2015    Procedure: DILATATION & CURETTAGE/HYSTEROSCOPY WITH NOVASURE ABLATION;  Surgeon: Donovan Kail, MD;  Location: [facility] ORS;  Service: Gynecology;  Laterality: N/A;    reports that she has never smoked. She has never used smokeless tobacco. She reports that she drinks alcohol. She reports that she does not use illicit drugs. family history includes Cancer in her mother; Cholelithiasis in her sister; Clotting disorder in  her daughter; Diabetes in her mother; Heart disease in her maternal grandfather and maternal uncle; Hypertension in her mother; Kidney cancer in her mother. Allergies  Allergen Reactions  . Codeine Sulfate Nausea And Vomiting  . Hydrochlorothiazide W-Triamterene     REACTION: cramping  . Oxycodone-Acetaminophen Nausea And Vomiting  . Prednisone Hives  . Victoza [Liraglutide]     n/v   Current Outpatient Prescriptions on File Prior to Visit  Medication Sig Dispense Refill  . Albiglutide (TANZEUM) 50 MG PEN Inject the contents of one pen once per week 12 each 1  . cholecalciferol (VITAMIN D) 1000 UNITS tablet Take 1 tablet (1,000 Units total) by mouth daily. 100 tablet 3  . diphenhydrAMINE (BENADRYL) 25 MG tablet Take 1 tablet (25 mg total) by mouth every 6 (six) hours as needed for itching or allergies (hives). 100 tablet 1  . ergocalciferol (VITAMIN D2) 50000 UNITS capsule Take 1 capsule (50,000 Units total) by mouth once a week. 6 capsule 0  . ferrous sulfate 325 (65 FE) MG tablet Take 325 mg by mouth daily with breakfast.    . fexofenadine (ALLEGRA) 180 MG tablet Take 180 mg by mouth daily as needed for allergies.     . fluconazole (DIFLUCAN) 150 MG tablet TAKE 1 TABLET BY MOUTH ONCE 1 tablet 1  . glucose blood (ONETOUCH VERIO) test  strip Use as instructed to check blood sugar twice a day dx code E11.65 200 each 1  . ibuprofen (ADVIL,MOTRIN) 800 MG tablet Take 800 mg by mouth every 8 (eight) hours as needed for fever, headache or mild pain.     Marland Kitchen. JARDIANCE 10 MG TABS tablet Take 1 tablet by mouth  daily 90 tablet 0  . metFORMIN (GLUCOPHAGE-XR) 500 MG 24 hr tablet Take 4 tablets by mouth  daily 360 tablet 0  . ONETOUCH DELICA LANCETS FINE MISC Use 2 per day dx code E11.65 200 each 1  . pantoprazole (PROTONIX) 40 MG tablet Take 1 tablet by mouth  daily 90 tablet 3  . ranitidine (ZANTAC) 300 MG tablet Take 1 tablet by mouth at  bedtime 90 tablet 3  . scopolamine (TRANSDERM-SCOP, 1.5 MG,) 1  MG/3DAYS Place 1 patch (1.5 mg total) onto the skin every 3 (three) days. 4 patch 1  . simvastatin (ZOCOR) 20 MG tablet Take 1 tablet by mouth at  bedtime 90 tablet 0  . spironolactone (ALDACTONE) 100 MG tablet Take 1 tablet by mouth  daily 90 tablet 3  . triamcinolone cream (KENALOG) 0.5 % Apply 1 application topically 3 (three) times daily. On rash 90 g 1  . valACYclovir (VALTREX) 1000 MG tablet Take 1,000 mg by mouth daily.     . vitamin B-12 (CYANOCOBALAMIN) 1000 MCG tablet Take 1,000 mcg by mouth daily.     No current facility-administered medications on file prior to visit.   Review of Systems  Constitutional: Negative for unusual diaphoresis or night sweats HENT: Negative for ringing in ear or discharge Eyes: Negative for double vision or worsening visual disturbance.  Respiratory: Negative for choking and stridor.   Gastrointestinal: Negative for vomiting or other signifcant bowel change Genitourinary: Negative for hematuria or change in urine volume.  Musculoskeletal: Negative for other MSK pain or swelling Skin: Negative for color change and worsening wound.  Neurological: Negative for tremors and numbness other than noted  Psychiatric/Behavioral: Negative for decreased concentration or agitation other than above       Objective:   Physical Exam BP 118/80 mmHg  Pulse 86  Temp(Src) 97.9 F (36.6 C) (Oral)  Ht 5\' 6"  (1.676 m)  Wt 278 lb (126.1 kg)  BMI 44.89 kg/m2  SpO2 94%  LMP 07/21/2015 VS noted, mild ill Constitutional: Pt appears in no significant distress HENT: Head: NCAT.  Right Ear: External ear normal.  Left Ear: External ear normal.  Eyes: . Pupils are equal, round, and reactive to light. Conjunctivae and EOM are normal Bilat tm's with mild erythema.  Max sinus areas mild tender.  Pharynx with mild erythema, no exudate Neck: Normal range of motion. Neck supple.  Cardiovascular: Normal rate and regular rhythm.   Pulmonary/Chest: Effort normal and breath  sounds decreased without rales but bilat scattered wheezing.  Neurological: Pt is alert. Not confused , motor grossly intact Skin: Skin is warm. No rash, no LE edema Psychiatric: Pt behavior is normal. No agitation.     Assessment & Plan:

## 2015-08-04 NOTE — Assessment & Plan Note (Signed)
stable overall by history and exam, recent data reviewed with pt, and pt to continue medical treatment as before,  to f/u any worsening symptoms or concerns Lab Results  Component Value Date   HGBA1C 7.5* 05/05/2015   Pt to call for onset polys or cbg > 200

## 2015-08-05 ENCOUNTER — Ambulatory Visit (INDEPENDENT_AMBULATORY_CARE_PROVIDER_SITE_OTHER)
Admission: RE | Admit: 2015-08-05 | Discharge: 2015-08-05 | Disposition: A | Discharge status: Home / Self Care | Payer: 59 | Source: Ambulatory Visit | Attending: Internal Medicine | Admitting: Internal Medicine

## 2015-08-05 DIAGNOSIS — R05 Cough: Secondary | ICD-10-CM | POA: Diagnosis not present

## 2015-08-05 DIAGNOSIS — R059 Cough, unspecified: Secondary | ICD-10-CM

## 2015-08-10 ENCOUNTER — Encounter: Payer: Self-pay | Admitting: Internal Medicine

## 2015-08-31 ENCOUNTER — Other Ambulatory Visit: Payer: 59

## 2015-09-01 ENCOUNTER — Other Ambulatory Visit (INDEPENDENT_AMBULATORY_CARE_PROVIDER_SITE_OTHER): Payer: 59

## 2015-09-01 DIAGNOSIS — Z794 Long term (current) use of insulin: Secondary | ICD-10-CM | POA: Diagnosis not present

## 2015-09-01 DIAGNOSIS — E1165 Type 2 diabetes mellitus with hyperglycemia: Secondary | ICD-10-CM | POA: Diagnosis not present

## 2015-09-01 LAB — BASIC METABOLIC PANEL
BUN: 12 mg/dL (ref 6–23)
CO2: 25 mEq/L (ref 19–32)
Calcium: 9.8 mg/dL (ref 8.4–10.5)
Chloride: 101 mEq/L (ref 96–112)
Creatinine, Ser: 0.8 mg/dL (ref 0.40–1.20)
GFR: 100.03 mL/min (ref >60.00)
Glucose, Bld: 146 mg/dL — ABNORMAL HIGH (ref 70–99)
POTASSIUM: 4.2 meq/L (ref 3.5–5.1)
SODIUM: 135 meq/L (ref 135–145)

## 2015-09-01 LAB — HEMOGLOBIN A1C: Hgb A1c MFr Bld: 8.3 % — ABNORMAL HIGH (ref 4.6–6.5)

## 2015-09-03 ENCOUNTER — Encounter: Payer: Self-pay | Admitting: Endocrinology

## 2015-09-03 ENCOUNTER — Ambulatory Visit (INDEPENDENT_AMBULATORY_CARE_PROVIDER_SITE_OTHER): Payer: 59 | Admitting: Endocrinology

## 2015-09-03 VITALS — BP 118/76 | HR 94 | Temp 98.4°F | Resp 16 | Ht 66.0 in | Wt 277.4 lb

## 2015-09-03 DIAGNOSIS — E1165 Type 2 diabetes mellitus with hyperglycemia: Secondary | ICD-10-CM

## 2015-09-03 NOTE — Progress Notes (Signed)
Patient ID: Julia Estrada, female   DOB: 13-Feb-1971, 45 y.o.   MRN: [id number]           Reason for Appointment: Follow-up for Type 2 Diabetes  History of Present Illness:          Diagnosis: Type 2 diabetes mellitus, date of diagnosis: ?  2011        Past history:  Not clear when her diagnosis was first made but she probably did not have very high sugars. She was initially treated with metformin which was titrated to 2000 mg a day without side effects She was also sent to the diabetes classes at the time of diagnosis At some point she was also given Januvia which has been continued She was also tried on Victoza in 08/2012 but she could not tolerate it for more than a week because of significant nausea and vomiting.  She thinks she may have taken only the 0.6 mg dose Since in 2015 her A1c had been over 8% she was started on Jardiance  Recent history:   Oral hypoglycemic drugs the patient is taking are: Jardiance 10 mg, metformin ER 2 g      Since her A1c had been consistently over 7% she was started on Tanzeum on her last visit in 9/16 This was increased to 50 mg in 11/16 She has taken 50 mg weekly without any nausea which she had with Victoza  Her A1c is much higher than usual at 8.3 She thinks this is partly related to her getting periodic steroids and respiratory infection  Current blood sugar patterns and problems identified:  She is now on a study program with a new glucose meter that cannot be downloaded and does not keep a record  Although her blood sugars were averaging 172 on her download up until 1/22 she seems to have better readings at home recently with only a couple of readings around 170-180  Fasting readings are actively better, more recently her on 130 but otherwise averaging 150  She is checking some readings in the evenings after supper and last month there were as high as 222 and mostly high  Lab glucose was 146 fasting  She has not been walking which she was  doing before because of moving and packing.  She still has difficulty losing weight.       Side effects from medications have been: Vomiting with Victoza which she tried for one week only     Compliance with the medical regimen: Fair  Hypoglycemia:   none  Glucose monitoring: About once a day or less   Blood Glucose readings as above     Self-care: The diet that the patient has been following is: none, does avoid sweets, may not always control portions or high-fat foods Meals: 3 meals per day. Breakfast is oatmeal or biscuit,   lunch is chicken or pork chop  Has fruit cups chips or popcorn for snacks        Exercise: Has not been walking regularly, previously was doing 2-3/7 , 30 min at a time Dietician visit, most recent: 1/16              Weight history: Previous range 276 -309  Wt Readings from Last 3 Encounters:  09/03/15 277 lb 6.4 oz (125.828 kg)  08/04/15 278 lb (126.1 kg)  07/29/15 276 lb (125.193 kg)    Glycemic control:   Lab Results  Component Value Date   HGBA1C 8.3* 09/01/2015   HGBA1C  7.5* 05/05/2015   HGBA1C 7.6* 03/30/2015   Lab Results  Component Value Date   MICROALBUR 1.7 12/24/2014   LDLCALC 104* 05/05/2015   CREATININE 0.80 09/01/2015    Lab on 09/01/2015  Component Date Value Ref Range Status  . Sodium 09/01/2015 135  135 - 145 mEq/L Final  . Potassium 09/01/2015 4.2  3.5 - 5.1 mEq/L Final  . Chloride 09/01/2015 101  96 - 112 mEq/L Final  . CO2 09/01/2015 25  19 - 32 mEq/L Final  . Glucose, Bld 09/01/2015 146* 70 - 99 mg/dL Final  . BUN 91/47/8295 12  6 - 23 mg/dL Final  . Creatinine, Ser 09/01/2015 0.80  0.40 - 1.20 mg/dL Final  . Calcium 62/13/0865 9.8  8.4 - 10.5 mg/dL Final  . GFR 78/46/9629 100.03  >60.00 mL/min Final  . Hgb A1c MFr Bld 09/01/2015 8.3* 4.6 - 6.5 % Final   Glycemic Control Guidelines for People with Diabetes:Non Diabetic:  <6%Goal of Therapy: <7%Additional Action Suggested:  >8%         Medication List         This list is accurate as of: 09/03/15  9:59 AM.  Always use your most recent med list.               Albiglutide 50 MG Pen  Commonly known as:  TANZEUM  Inject the contents of one pen once per week     albuterol 108 (90 Base) MCG/ACT inhaler  Commonly known as:  PROVENTIL HFA;VENTOLIN HFA  Inhale 2 puffs into the lungs every 6 (six) hours as needed for wheezing or shortness of breath.     cholecalciferol 1000 units tablet  Commonly known as:  VITAMIN D  Take 1 tablet (1,000 Units total) by mouth daily.     diphenhydrAMINE 25 MG tablet  Commonly known as:  BENADRYL  Take 1 tablet (25 mg total) by mouth every 6 (six) hours as needed for itching or allergies (hives).     ergocalciferol 50000 units capsule  Commonly known as:  VITAMIN D2  Take 1 capsule (50,000 Units total) by mouth once a week.     ferrous sulfate 325 (65 FE) MG tablet  Take 325 mg by mouth daily with breakfast.     fexofenadine 180 MG tablet  Commonly known as:  ALLEGRA  Take 180 mg by mouth daily as needed for allergies.     fluconazole 150 MG tablet  Commonly known as:  DIFLUCAN  TAKE 1 TABLET BY MOUTH ONCE     fluticasone 50 MCG/ACT nasal spray  Commonly known as:  FLONASE  Place 2 sprays into both nostrils daily.     glucose blood test strip  Commonly known as:  ONETOUCH VERIO  Use as instructed to check blood sugar twice a day dx code E11.65     HYDROcodone-homatropine 5-1.5 MG/5ML syrup  Commonly known as:  HYCODAN  Take 5 mLs by mouth every 6 (six) hours as needed for cough.     ibuprofen 800 MG tablet  Commonly known as:  ADVIL,MOTRIN  Take 800 mg by mouth every 8 (eight) hours as needed for fever, headache or mild pain.     JARDIANCE 10 MG Tabs tablet  Generic drug:  empagliflozin  Take 1 tablet by mouth  daily     metFORMIN 500 MG 24 hr tablet  Commonly known as:  GLUCOPHAGE-XR  Take 4 tablets by mouth  daily     methylPREDNISolone 4 MG Tbpk tablet  Commonly known as:  MEDROL  DOSEPAK  Take as directed     ONETOUCH DELICA LANCETS FINE Misc  Use 2 per day dx code E11.65     pantoprazole 40 MG tablet  Commonly known as:  PROTONIX  Take 1 tablet by mouth  daily     ranitidine 300 MG tablet  Commonly known as:  ZANTAC  Take 1 tablet by mouth at  bedtime     simvastatin 20 MG tablet  Commonly known as:  ZOCOR  Take 1 tablet by mouth at  bedtime     spironolactone 100 MG tablet  Commonly known as:  ALDACTONE  Take 1 tablet by mouth  daily     triamcinolone cream 0.5 %  Commonly known as:  KENALOG  Apply 1 application topically 3 (three) times daily. On rash     valACYclovir 1000 MG tablet  Commonly known as:  VALTREX  Take 1,000 mg by mouth daily.     vitamin B-12 1000 MCG tablet  Commonly known as:  CYANOCOBALAMIN  Take 1,000 mcg by mouth daily.        Allergies:  Allergies  Allergen Reactions  . Codeine Sulfate Nausea And Vomiting  . Hydrochlorothiazide W-Triamterene     REACTION: cramping  . Oxycodone-Acetaminophen Nausea And Vomiting  . Prednisone Hives  . Victoza [Liraglutide]     n/v    Past Medical History  Diagnosis Date  . Anemia     iron deficiency  . Obesity     BMI=44  . Vitamin B12 deficiency   . Vitamin D deficiency   . Depression 2011  . Diabetes mellitus 2010    type II  . GERD (gastroesophageal reflux disease) 2004  . Hypertension 2008  . Hyperlipidemia   . Shortness of breath     with exercise  . Anxiety   . Sleep apnea     uses CPAP every night    Past Surgical History  Procedure Laterality Date  . Fibroidectomy  2002    laparotomy for removal of fibroids  . Tonsillectomy and adenoidectomy    . Cesarean section  2004    x 1  . Myomectomy  2002  . Shoulder arthroscopy Left 04/30/2014    Procedure: Left Shoulder Arthroscopy and Debridement;  Surgeon: Nadara Mustard, MD;  Location: [facility] OR;  Service: Orthopedics;  Laterality: Left;  . Wisdom tooth extraction    . Upper gi endoscopy    . Colonoscopy      . Dilitation & currettage/hystroscopy with novasure ablation N/A 01/09/2015    Procedure: DILATATION & CURETTAGE/HYSTEROSCOPY WITH NOVASURE ABLATION;  Surgeon: Donovan Kail, MD;  Location: [facility] ORS;  Service: Gynecology;  Laterality: N/A;    Family History  Problem Relation Age of Onset  . Hypertension Mother     father, sisters  . Diabetes Mother     sister  . Kidney cancer Mother   . Cancer Mother   . Cholelithiasis Sister   . Clotting disorder Daughter   . Heart disease Maternal Uncle   . Heart disease Maternal Grandfather     Social History:  reports that she has never smoked. She has never used smokeless tobacco. She reports that she drinks alcohol. She reports that she does not use illicit drugs.    Review of Systems        Lipids:  Her LDL is treated by PCP with  Zocor She does have a family history of CAD      Lab Results  Component Value Date   CHOL 166 05/05/2015   HDL 40.40 05/05/2015   LDLCALC 104* 05/05/2015   LDLDIRECT 72.0 09/24/2014   TRIG 107.0 05/05/2015   CHOLHDL 4 05/05/2015                  The blood pressure has been treated with Aldactone since 2008.  Also on 10 mg Jardiance   Continues to take 100 mg Aldactone and potassium is stable Urine microalbumin has been normal    Lab Results  Component Value Date   CREATININE 0.80 09/01/2015   BUN 12 09/01/2015   NA 135 09/01/2015   K 4.2 09/01/2015   CL 101 09/01/2015   CO2 25 09/01/2015        Physical Examination:  BP 118/76 mmHg  Pulse 94  Temp(Src) 98.4 F (36.9 C)  Resp 16  Ht 5\' 6"  (1.676 m)  Wt 277 lb 6.4 oz (125.828 kg)  BMI 44.79 kg/m2  SpO2 87%  LMP 07/21/2015        ASSESSMENT:  Diabetes type 2, uncontrolled    See history of present illness for detailed discussion of his current management, blood sugar patterns and problems identified Her blood sugars are poorly controlled now with A1c over 8% This is from lack of exercise, and consistent diet, getting periodic steroids  and intercurrent illness She has a few good readings in the last few days and they're not as consistently high after supper  However she has difficulty losing weight Currently not exercising consistently either  Hypertension: Controlled with Aldactone 100 mg and electrolytes are stable  PLAN:   Increase Jardiance to 25 mg.  She can try 20 mg with her current prescription and then call if this is tolerated and she does not have any candidiasis  Keep a diary of blood sugars  Continue Tanzeum  50 mg  More glucose monitoring after meals  Consider adding insulin if she has intercurrent illness or steroids needing better control of glucose  Patient Instructions  Check blood sugars on waking up  2-3 times a week Also check blood sugars about 2 hours after a meal and do this after different meals by rotation  Recommended blood sugar levels on waking up is 90-130 and about 2 hours after meal is 130-160  Please bring your blood sugar monitor to each visit, thank you  Take 2 Jardiance daily, call for 25mg  Rx  Walk/exercise daily              Jeovany Huitron 09/03/2015, 9:59 AM   Note: This office note was prepared with Dragon voice recognition system technology. Any transcriptional errors that result from this process are unintentional.

## 2015-09-03 NOTE — Patient Instructions (Addendum)
Check blood sugars on waking up  2-3 times a week Also check blood sugars about 2 hours after a meal and do this after different meals by rotation  Recommended blood sugar levels on waking up is 90-130 and about 2 hours after meal is 130-160  Please bring your blood sugar monitor to each visit, thank you  Take 2 Jardiance daily, call for  Rx  Walk/exercise daily

## 2015-09-12 ENCOUNTER — Other Ambulatory Visit: Payer: Self-pay | Admitting: Internal Medicine

## 2015-09-18 ENCOUNTER — Other Ambulatory Visit: Payer: Self-pay | Admitting: *Deleted

## 2015-09-18 ENCOUNTER — Telehealth: Payer: Self-pay | Admitting: Endocrinology

## 2015-09-18 MED ORDER — ALBIGLUTIDE 50 MG ~~LOC~~ PEN: PEN_INJECTOR | SUBCUTANEOUS | Status: DC

## 2015-09-18 MED ORDER — EMPAGLIFLOZIN 10 MG PO TABS: ORAL_TABLET | ORAL | Status: DC

## 2015-09-18 NOTE — Telephone Encounter (Signed)
Patient need a refill for jardiance and Tanzeum send to  [id number] [place], [place] - [address] [phone] (Phone) [phone] (Fax)

## 2015-09-18 NOTE — Telephone Encounter (Signed)
rxs sent

## 2015-09-22 ENCOUNTER — Encounter: Payer: Self-pay | Admitting: *Deleted

## 2015-09-22 LAB — HM DIABETES EYE EXAM

## 2015-09-23 ENCOUNTER — Other Ambulatory Visit: Payer: Self-pay | Admitting: *Deleted

## 2015-09-23 ENCOUNTER — Telehealth: Payer: Self-pay | Admitting: Endocrinology

## 2015-09-23 MED ORDER — EMPAGLIFLOZIN 10 MG PO TABS: ORAL_TABLET | ORAL | Status: DC

## 2015-09-23 MED ORDER — ALBIGLUTIDE 50 MG ~~LOC~~ PEN: PEN_INJECTOR | SUBCUTANEOUS | Status: DC

## 2015-09-23 NOTE — Telephone Encounter (Signed)
Patient need refill nof medication Tanzeum and Jardiance send to  [facility] [place], [place] - [address] [phone] (Phone) [phone] (Fax)

## 2015-09-23 NOTE — Telephone Encounter (Signed)
rx's have been sent to [facility] 

## 2015-09-25 ENCOUNTER — Telehealth: Payer: Self-pay | Admitting: Endocrinology

## 2015-09-25 MED ORDER — EMPAGLIFLOZIN 25 MG PO TABS: 25.0000 mg | ORAL_TABLET | Freq: Every day | ORAL | Status: DC

## 2015-09-25 NOTE — Telephone Encounter (Signed)
Per Dr. Lucianne Muss is to go on 25 MG this will be sent in

## 2015-09-25 NOTE — Telephone Encounter (Signed)
Pt requesting we call [facility] and state the 20 mg is replacing the 10 in hopes that she will not be charged a copay on this medication. I informed her she could try a copay card at her local pharmacy to see if she could get it cheaper. Also will make a notation to mail service for dosage change

## 2015-09-25 NOTE — Addendum Note (Signed)
Addended by: Donnita Falls on: 09/25/2015 11:33 AM   Modules accepted: Orders

## 2015-09-25 NOTE — Telephone Encounter (Signed)
PT said the wrong MG was called in to [facility].  She said they sent her 10 and said that Dr. Lucianne Muss has changed it to 20.

## 2015-09-25 NOTE — Telephone Encounter (Signed)
Pt informed as well

## 2015-10-06 ENCOUNTER — Ambulatory Visit (INDEPENDENT_AMBULATORY_CARE_PROVIDER_SITE_OTHER): Payer: 59 | Admitting: Allergy and Immunology

## 2015-10-06 ENCOUNTER — Encounter: Payer: Self-pay | Admitting: Allergy and Immunology

## 2015-10-06 VITALS — BP 124/86 | HR 88 | Temp 97.6°F | Resp 18 | Ht 64.96 in | Wt 276.2 lb

## 2015-10-06 DIAGNOSIS — J3089 Other allergic rhinitis: Secondary | ICD-10-CM

## 2015-10-06 DIAGNOSIS — J4530 Mild persistent asthma, uncomplicated: Secondary | ICD-10-CM

## 2015-10-06 MED ORDER — BECLOMETHASONE DIPROPIONATE 80 MCG/ACT NA AERS: 1.0000 | INHALATION_SPRAY | Freq: Two times a day (BID) | NASAL | Status: DC

## 2015-10-06 MED ORDER — MONTELUKAST SODIUM 10 MG PO TABS: 10.0000 mg | ORAL_TABLET | Freq: Every day | ORAL | Status: DC

## 2015-10-06 NOTE — Assessment & Plan Note (Addendum)
   Aeroallergen avoidance measures have been discussed and provided in written form.  Nasal saline lavage (NeilMed) as needed has been recommended along with instructions for proper administration.  For now, continue azelastine nasal spray, one spray per nostril twice a day.  A sample and prescription have been provided for Qnasl 80 g, one actuation per nostril twice daily as needed.  Proper technique has been discussed and demonstrated.  Discontinue fluticasone nasal spray.  A prescription has been provided for montelukast 10 mg daily bedtime.  If allergen avoidance measures and medications fail to adequately relieve symptoms, aeroallergen immunotherapy will be considered.

## 2015-10-06 NOTE — Progress Notes (Addendum)
New Patient Note  RE: Julia Estrada MRN: [id number] DOB: 10/12/1970 Date of Office Visit: 10/06/2015  Referring provider: Tresa GarterPlotnikov, Aleksei V, MD Primary care provider: Sonda PrimesAlex Plotnikov, MD  Chief Complaint: Allergic Rhinitis  and Asthma   History of present illness: HPI Comments: Julia Estrada is a 45 y.o. female who presents today for consultation of allergic rhinitis and asthma.  She complains of severe nasal congestion "all the time", mouth breathing, thick postnasal drainage, sore throat, rhinorrhea, sneezing, and ocular pruritus.  The symptoms occur year round but tend to be more severe in the springtime and in the fall.  She has a history of asthma with lower respiratory symptoms triggered primarily by respiratory tract infections and exercise.  She reports that over the past few months she has been experiencing more frequent chest tightness and dyspnea with mild/moderate exertion.   Assessment and plan: Allergic rhinitis  Aeroallergen avoidance measures have been discussed and provided in written form.  Nasal saline lavage (NeilMed) as needed has been recommended along with instructions for proper administration.  For now, continue azelastine nasal spray, one spray per nostril twice a day.  A sample and prescription have been provided for Qnasl 80 g, one actuation per nostril twice daily as needed.  Proper technique has been discussed and demonstrated.  Discontinue fluticasone nasal spray.  A prescription has been provided for montelukast 10 mg daily bedtime.  If allergen avoidance measures and medications fail to adequately relieve symptoms, aeroallergen immunotherapy will be considered.  Mild persistent asthma  Suboptimal control currently, we will step up therapy at this time.  A prescription has been provided for montelukast (as above).  Continue albuterol HFA, 1-2 inhalations every 4-6 hours as needed.  Subjective and objective measures of pulmonary function will  be followed and the treatment plan will be adjusted accordingly.    Meds ordered this encounter  Medications  . Beclomethasone Dipropionate (QNASL) 80 MCG/ACT AERS    Sig: Place 1 spray into the nose 2 (two) times daily.    Dispense:  8.7 g    Refill:  5  . montelukast (SINGULAIR) 10 MG tablet    Sig: Take 1 tablet (10 mg total) by mouth at bedtime.    Dispense:  30 tablet    Refill:  5    Diagnositics: Spirometry: FVC was 2.39 L and FEV1 was 1.95 L (82% predicted) with 120 mL (6%) post bronchodilator improvement. Epicutaneous tests: Negative despite a positive histamine control.  Intradermal tests: Positive to grass pollen, weed pollen, ragweed pollen, tree pollen, mold, dust mite, cockroach antigen.    Physical examination: Blood pressure 124/86, pulse 88, temperature 97.6 F (36.4 C), temperature source Oral, resp. rate 18, height 5' 4.96" (1.65 m), weight 276 lb 3.8 oz (125.3 kg).  General: Alert, interactive, in no acute distress. HEENT: TMs pearly gray, turbinates markedly edematous with clear discharge, post-pharynx crowded. Neck: Supple without lymphadenopathy. Lungs: Mildly decreased breath sounds bilaterally without wheezing, rhonchi or rales. CV: Normal S1, S2 without murmurs. Abdomen: Nondistended, nontender. Skin: Warm and dry, without lesions or rashes. Extremities:  No clubbing, cyanosis or edema. Neuro:   Grossly intact.  Review of systems:  Review of Systems  Constitutional: Negative for fever, chills and weight loss.  HENT: Positive for congestion. Negative for nosebleeds.   Eyes: Negative for blurred vision.  Respiratory: Positive for cough, shortness of breath and wheezing. Negative for hemoptysis.   Cardiovascular: Negative for chest pain.  Gastrointestinal: Negative for diarrhea and constipation.  Genitourinary: Negative for dysuria.  Musculoskeletal: Negative for myalgias and joint pain.  Skin: Negative for itching and rash.  Neurological:  Positive for headaches. Negative for dizziness.  Endo/Heme/Allergies: Positive for environmental allergies. Does not bruise/bleed easily.    Past medical history:  Past Medical History  Diagnosis Date  . Anemia     iron deficiency  . Obesity     BMI=44  . Vitamin B12 deficiency   . Vitamin D deficiency   . Depression 2011  . Diabetes mellitus 2010    type II  . GERD (gastroesophageal reflux disease) 2004  . Hypertension 2008  . Hyperlipidemia   . Shortness of breath     with exercise  . Anxiety   . Sleep apnea     uses CPAP every night    Past surgical history:  Past Surgical History  Procedure Laterality Date  . Fibroidectomy  2002    laparotomy for removal of fibroids  . Tonsillectomy and adenoidectomy    . Cesarean section  2004    x 1  . Myomectomy  2002  . Shoulder arthroscopy Left 04/30/2014    Procedure: Left Shoulder Arthroscopy and Debridement;  Surgeon: Nadara Mustard, MD;  Location: [facility] OR;  Service: Orthopedics;  Laterality: Left;  . Wisdom tooth extraction    . Upper gi endoscopy    . Colonoscopy    . Dilitation & currettage/hystroscopy with novasure ablation N/A 01/09/2015    Procedure: DILATATION & CURETTAGE/HYSTEROSCOPY WITH NOVASURE ABLATION;  Surgeon: Donovan Kail, MD;  Location: [facility] ORS;  Service: Gynecology;  Laterality: N/A;    Family history: Family History  Problem Relation Age of Onset  . Hypertension Mother     father, sisters  . Diabetes Mother     sister  . Kidney cancer Mother   . Cancer Mother   . Cholelithiasis Sister   . Allergic rhinitis Sister   . Clotting disorder Daughter   . Urticaria Daughter   . Heart disease Maternal Uncle   . Heart disease Maternal Grandfather     Social history: Social History   Social History  . Marital Status: Divorced    Spouse Name: N/A  . Number of Children: 1  . 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 Smoking status: Never  Smoker   . Smokeless tobacco: Never Used  . Alcohol Use: No     Comment: occasional  . Drug Use: No  . Sexual Activity: Yes    Birth Control/ Protection: Pill   Other Topics Concern  . Not on file   Social History Narrative   Environmental History: The patient lives in an apartment with carpeting throughout and central air/heat.  She is a nonsmoker without pets, however there are many dogs and cats in the apartment complex.    Medication List       This list is accurate as of: 10/06/15 12:44 PM.  Always use your most recent med list.               Albiglutide 50 MG Pen  Commonly known as:  TANZEUM  Inject the contents of one pen once per week     albuterol 108 (90 Base) MCG/ACT inhaler  Commonly known as:  PROVENTIL HFA;VENTOLIN HFA  Inhale 2 puffs into the lungs every 6 (six) hours as needed for wheezing or shortness of breath.     azelastine 0.1 % nasal spray  Commonly  known as:  ASTELIN  Place 1 spray into both nostrils 2 (two) times daily. Use in each nostril as directed     Beclomethasone Dipropionate 80 MCG/ACT Aers  Commonly known as:  QNASL  Place 1 spray into the nose 2 (two) times daily.     cholecalciferol 1000 units tablet  Commonly known as:  VITAMIN D  Take 1 tablet (1,000 Units total) by mouth daily.     diphenhydrAMINE 25 MG tablet  Commonly known as:  BENADRYL  Take 1 tablet (25 mg total) by mouth every 6 (six) hours as needed for itching or allergies (hives).     empagliflozin 25 MG Tabs tablet  Commonly known as:  JARDIANCE  Take 25 mg by mouth daily.     ergocalciferol 50000 units capsule  Commonly known as:  VITAMIN D2  Take 1 capsule (50,000 Units total) by mouth once a week.     ferrous sulfate 325 (65 FE) MG tablet  Take 325 mg by mouth daily with breakfast.     fexofenadine 180 MG tablet  Commonly known as:  ALLEGRA  Take 180 mg by mouth daily as needed for allergies.     fluconazole 150 MG tablet  Commonly known as:  DIFLUCAN    TAKE 1 TABLET BY MOUTH ONCE     fluticasone 50 MCG/ACT nasal spray  Commonly known as:  FLONASE  Place 2 sprays into both nostrils daily.     glucose blood test strip  Commonly known as:  ONETOUCH VERIO  Use as instructed to check blood sugar twice a day dx code E11.65     ibuprofen 800 MG tablet  Commonly known as:  ADVIL,MOTRIN  Take 800 mg by mouth every 8 (eight) hours as needed for fever, headache or mild pain.     metFORMIN 500 MG 24 hr tablet  Commonly known as:  GLUCOPHAGE-XR  Take 4 tablets by mouth  daily     methylPREDNISolone 4 MG Tbpk tablet  Commonly known as:  MEDROL DOSEPAK  Take as directed     montelukast 10 MG tablet  Commonly known as:  SINGULAIR  Take 1 tablet (10 mg total) by mouth at bedtime.     NASAL SPRAY SALINE NA  Place into the nose.     ONETOUCH DELICA LANCETS FINE Misc  Use 2 per day dx code E11.65     pantoprazole 40 MG tablet  Commonly known as:  PROTONIX  Take 1 tablet by mouth  daily     ranitidine 300 MG tablet  Commonly known as:  ZANTAC  Take 1 tablet by mouth at  bedtime     simvastatin 20 MG tablet  Commonly known as:  ZOCOR  Take 1 tablet by mouth at  bedtime     spironolactone 100 MG tablet  Commonly known as:  ALDACTONE  Take 1 tablet by mouth  daily     triamcinolone cream 0.5 %  Commonly known as:  KENALOG  Apply 1 application topically 3 (three) times daily. On rash     valACYclovir 1000 MG tablet  Commonly known as:  VALTREX  Take 1,000 mg by mouth daily.     vitamin B-12 1000 MCG tablet  Commonly known as:  CYANOCOBALAMIN  Take 1,000 mcg by mouth daily. Reported on 10/06/2015        Known medication allergies: Allergies  Allergen Reactions  . Codeine Sulfate Nausea And Vomiting  . Hydrochlorothiazide W-Triamterene     REACTION: cramping  .  Oxycodone-Acetaminophen Nausea And Vomiting  . Prednisone Hives  . Victoza [Liraglutide]     n/v    I appreciate the opportunity to take part in this Jezebel's  care. Please do not hesitate to contact me with questions.  Sincerely,   R. Jorene Guest, MD

## 2015-10-06 NOTE — Patient Instructions (Addendum)
Allergic rhinitis  Aeroallergen avoidance measures have been discussed and provided in written form.  Nasal saline lavage (NeilMed) as needed has been recommended along with instructions for proper administration.  For now, continue azelastine nasal spray, one spray per nostril twice a day.  A sample and prescription have been provided for Qnasl 80 g, one actuation per nostril twice daily as needed.  Proper technique has been discussed and demonstrated.  Discontinue fluticasone nasal spray.  A prescription has been provided for montelukast 10 mg daily bedtime.  If allergen avoidance measures and medications fail to adequately relieve symptoms, aeroallergen immunotherapy will be considered.  Mild persistent asthma  Suboptimal control currently, we will step up therapy at this time.  A prescription has been provided for montelukast (as above).  Continue albuterol HFA, 1-2 inhalations every 4-6 hours as needed.  Subjective and objective measures of pulmonary function will be followed and the treatment plan will be adjusted accordingly.    Return in about 3 months (around 01/06/2016), or if symptoms worsen or fail to improve.  Reducing Pollen Exposure  The American Academy of Allergy, Asthma and Immunology suggests the following steps to reduce your exposure to pollen during allergy seasons.    1. Do not hang sheets or clothing out to dry; pollen may collect on these items. 2. Do not mow lawns or spend time around freshly cut grass; mowing stirs up pollen. 3. Keep windows closed at night.  Keep car windows closed while driving. 4. Minimize morning activities outdoors, a time when pollen counts are usually at their highest. 5. Stay indoors as much as possible when pollen counts or humidity is high and on windy days when pollen tends to remain in the air longer. 6. Use air conditioning when possible.  Many air conditioners have filters that trap the pollen spores. 7. Use a HEPA room  air filter to remove pollen form the indoor air you breathe.   Control of House Dust Mite Allergen  House dust mites play a major role in allergic asthma and rhinitis.  They occur in environments with high humidity wherever human skin, the food for dust mites is found. High levels have been detected in dust obtained from mattresses, pillows, carpets, upholstered furniture, bed covers, clothes and soft toys.  The principal allergen of the house dust mite is found in its feces.  A gram of dust may contain 1,000 mites and 250,000 fecal particles.  Mite antigen is easily measured in the air during house cleaning activities.    1. Encase mattresses, including the box spring, and pillow, in an air tight cover.  Seal the zipper end of the encased mattresses with wide adhesive tape. 2. Wash the bedding in water of 130 degrees Farenheit weekly.  Avoid cotton comforters/quilts and flannel bedding: the most ideal bed covering is the dacron comforter. 3. Remove all upholstered furniture from the bedroom. 4. Remove carpets, carpet padding, rugs, and non-washable window drapes from the bedroom.  Wash drapes weekly or use plastic window coverings. 5. Remove all non-washable stuffed toys from the bedroom.  Wash stuffed toys weekly. 6. Have the room cleaned frequently with a vacuum cleaner and a damp dust-mop.  The patient should not be in a room which is being cleaned and should wait 1 hour after cleaning before going into the room. 7. Close and seal all heating outlets in the bedroom.  Otherwise, the room will become filled with dust-laden air.  An electric heater can be used to heat the room.  Reduce indoor humidity to less than 50%.  Do not use a humidifier.   Control of Mold Allergen  Mold and fungi can grow on a variety of surfaces provided certain temperature and moisture conditions exist.  Outdoor molds grow on plants, decaying vegetation and soil.  The major outdoor mold, Alternaria and Cladosporium, are  found in very high numbers during hot and dry conditions.  Generally, a late Summer - Fall peak is seen for common outdoor fungal spores.  Rain will temporarily lower outdoor mold spore count, but counts rise rapidly when the rainy period ends.  The most important indoor molds are Aspergillus and Penicillium.  Dark, humid and poorly ventilated basements are ideal sites for mold growth.  The next most common sites of mold growth are the bathroom and the kitchen.  Outdoor MicrosoftMold Control 1. Use air conditioning and keep windows closed 2. Avoid exposure to decaying vegetation. 3. Avoid leaf raking. 4. Avoid grain handling. 5. Consider wearing a face mask if working in moldy areas.  Indoor Mold Control 1. Maintain humidity below 50%. 2. Clean washable surfaces with 5% bleach solution. 3. Remove sources e.g. Contaminated carpets.  Control of Cockroach Allergen  Cockroach allergen has been identified as an important cause of acute attacks of asthma, especially in urban settings.  There are fifty-five species of cockroach that exist in the [place], however only three, the [place], [place] and Oriental species produce allergen that can affect patients with Asthma.  Allergens can be obtained from fecal particles, egg casings and secretions from cockroaches.    1. Remove food sources. 2. Reduce access to water. 3. Seal access and entry points. 4. Spray runways with 0.5-1% Diazinon or Chlorpyrifos 5. Blow boric acid power under stoves and refrigerator. 6. Place bait stations (hydramethylnon) at feeding sites.

## 2015-10-06 NOTE — Assessment & Plan Note (Signed)
   Suboptimal control currently, we will step up therapy at this time.  A prescription has been provided for montelukast (as above).  Continue albuterol HFA, 1-2 inhalations every 4-6 hours as needed.  Subjective and objective measures of pulmonary function will be followed and the treatment plan will be adjusted accordingly.

## 2015-10-07 ENCOUNTER — Telehealth: Payer: Self-pay | Admitting: Endocrinology

## 2015-10-07 ENCOUNTER — Other Ambulatory Visit (INDEPENDENT_AMBULATORY_CARE_PROVIDER_SITE_OTHER): Payer: 59

## 2015-10-07 ENCOUNTER — Other Ambulatory Visit: Payer: Self-pay | Admitting: *Deleted

## 2015-10-07 DIAGNOSIS — R252 Cramp and spasm: Secondary | ICD-10-CM

## 2015-10-07 LAB — BASIC METABOLIC PANEL
BUN: 15 mg/dL (ref 6–23)
CO2: 22 mEq/L (ref 19–32)
Calcium: 9.6 mg/dL (ref 8.4–10.5)
Chloride: 101 mEq/L (ref 96–112)
Creatinine, Ser: 0.73 mg/dL (ref 0.40–1.20)
GFR: 111.13 mL/min (ref >60.00)
GLUCOSE: 131 mg/dL — AB (ref 70–99)
POTASSIUM: 3.8 meq/L (ref 3.5–5.1)
SODIUM: 134 meq/L — AB (ref 135–145)

## 2015-10-07 LAB — MAGNESIUM: MAGNESIUM: 1.7 mg/dL (ref 1.5–2.5)

## 2015-10-07 NOTE — Telephone Encounter (Signed)
She could go back to the 10 mg Jardiance.  Not clear what other medication she is referring to.  Also recommend having blood test done for BMP and magnesium level

## 2015-10-07 NOTE — Telephone Encounter (Signed)
Please call pt back she feels like she is having severe leg cramps and feet pain

## 2015-10-07 NOTE — Telephone Encounter (Signed)
Patient called, she said since her 2 diabetic medications have been increased she is having severe leg cramps and her feet cramp as well, she's not sure if it could be because of the medication? She said it was so bad last night that she almost called 911.  Please advise.

## 2015-10-07 NOTE — Telephone Encounter (Signed)
Message given to patient, she is coming in today for labs.

## 2015-10-08 ENCOUNTER — Telehealth: Payer: Self-pay | Admitting: *Deleted

## 2015-10-08 NOTE — Progress Notes (Signed)
Quick Note:  Please let patient know that the lab result are all normal, may try OTC magnesium tablets twice a day if still having cramps with reducing Jardiance ______

## 2015-10-08 NOTE — Telephone Encounter (Signed)
Received call pt states she has been having cramps in her legs and her endocrinologist done labs to see if it was related to any of her diabetes, and her labs came back normal. She is requesting md to order xray. Inform pt MD is out of office this week, and b4 another provider order any test she will need to be seen. Made appt for Monday 3/13 w/Greg @ 4:00...Raechel Chute/lmb

## 2015-10-12 ENCOUNTER — Encounter: Payer: Self-pay | Admitting: *Deleted

## 2015-10-12 ENCOUNTER — Ambulatory Visit (INDEPENDENT_AMBULATORY_CARE_PROVIDER_SITE_OTHER): Payer: 59 | Admitting: Family

## 2015-10-12 ENCOUNTER — Encounter: Payer: Self-pay | Admitting: Family

## 2015-10-12 VITALS — BP 104/72 | HR 80 | Temp 98.1°F | Resp 16 | Ht 64.0 in | Wt 276.0 lb

## 2015-10-12 DIAGNOSIS — R252 Cramp and spasm: Secondary | ICD-10-CM | POA: Insufficient documentation

## 2015-10-12 NOTE — Progress Notes (Signed)
Subjective:    Patient ID: Julia Estrada, female    DOB: 03-16-1971, 45 y.o.   MRN: [id number]  Chief Complaint  Patient presents with  . Leg Cramps    usually has cramps in her feet but recently has had cramps in her legs, her right leg was hurting from tuesday night until yesterday, diebetic dr did change some medications    HPI:  Julia Estrada is a 45 y.o. female who  has a past medical history of Anemia; Obesity; Vitamin B12 deficiency; Vitamin D deficiency; Depression (2011); Diabetes mellitus (2010); GERD (gastroesophageal reflux disease) (2004); Hypertension (2008); Hyperlipidemia; Shortness of breath; Anxiety; and Sleep apnea. and presents today for an acute office visit.  Associated symptom of lower extremity cramping in her feet and lower legs has been going on for several weeks to couple of months she has had foot cramps that alternate and last about 10-15 minutes. She has also noted in the past couple of weeks and has noted severe muscle cramps in her legs that have lasted as long as 30 minutes. She has not had any recent leg or foot cramps in about 1 week. Modifying factors include decreasing one of her diabetic medications and also drinking a cup of gatorade before bed. Drinks at least 40 oz of caffeine per day. Previously had blood work completed that showed no significant abnormalities or deficiencies.  Allergies  Allergen Reactions  . Codeine Sulfate Nausea And Vomiting  . Hydrochlorothiazide W-Triamterene     REACTION: cramping  . Oxycodone-Acetaminophen Nausea And Vomiting  . Prednisone Hives  . Victoza [Liraglutide]     n/v     Current Outpatient Prescriptions on File Prior to Visit  Medication Sig Dispense Refill  . Albiglutide (TANZEUM) 50 MG PEN Inject the contents of one pen once per week 12 each 1  . albuterol (PROVENTIL HFA;VENTOLIN HFA) 108 (90 Base) MCG/ACT inhaler Inhale 2 puffs into the lungs every 6 (six) hours as needed for wheezing or shortness of  breath. 1 Inhaler 1  . azelastine (ASTELIN) 0.1 % nasal spray Place 1 spray into both nostrils 2 (two) times daily. Use in each nostril as directed    . Beclomethasone Dipropionate (QNASL) 80 MCG/ACT AERS Place 1 spray into the nose 2 (two) times daily. 8.7 g 5  . cholecalciferol (VITAMIN D) 1000 UNITS tablet Take 1 tablet (1,000 Units total) by mouth daily. 100 tablet 3  . diphenhydrAMINE (BENADRYL) 25 MG tablet Take 1 tablet (25 mg total) by mouth every 6 (six) hours as needed for itching or allergies (hives). 100 tablet 1  . empagliflozin (JARDIANCE) 25 MG TABS tablet Take 25 mg by mouth daily. 90 tablet 1  . ergocalciferol (VITAMIN D2) 50000 UNITS capsule Take 1 capsule (50,000 Units total) by mouth once a week. 6 capsule 0  . ferrous sulfate 325 (65 FE) MG tablet Take 325 mg by mouth daily with breakfast.    . fexofenadine (ALLEGRA) 180 MG tablet Take 180 mg by mouth daily as needed for allergies.     . fluconazole (DIFLUCAN) 150 MG tablet TAKE 1 TABLET BY MOUTH ONCE 1 tablet 1  . fluticasone (FLONASE) 50 MCG/ACT nasal spray Place 2 sprays into both nostrils daily.    Marland Kitchen glucose blood (ONETOUCH VERIO) test strip Use as instructed to check blood sugar twice a day dx code E11.65 200 each 1  . ibuprofen (ADVIL,MOTRIN) 800 MG tablet Take 800 mg by mouth every 8 (eight) hours as needed for fever,  headache or mild pain.     . metFORMIN (GLUCOPHAGE-XR) 500 MG 24 hr tablet Take 4 tablets by mouth  daily 360 tablet 0  . methylPREDNISolone (MEDROL DOSEPAK) 4 MG TBPK tablet Take as directed 21 tablet 0  . montelukast (SINGULAIR) 10 MG tablet Take 1 tablet (10 mg total) by mouth at bedtime. 30 tablet 5  . NASAL SPRAY SALINE NA Place into the nose.    Letta Pate. ONETOUCH DELICA LANCETS FINE MISC Use 2 per day dx code E11.65 200 each 1  . pantoprazole (PROTONIX) 40 MG tablet Take 1 tablet by mouth  daily 90 tablet 2  . ranitidine (ZANTAC) 300 MG tablet Take 1 tablet by mouth at  bedtime 90 tablet 3  . simvastatin  (ZOCOR) 20 MG tablet Take 1 tablet by mouth at  bedtime 90 tablet 0  . spironolactone (ALDACTONE) 100 MG tablet Take 1 tablet by mouth  daily 90 tablet 3  . triamcinolone cream (KENALOG) 0.5 % Apply 1 application topically 3 (three) times daily. On rash 90 g 1  . valACYclovir (VALTREX) 1000 MG tablet Take 1,000 mg by mouth daily.     . vitamin B-12 (CYANOCOBALAMIN) 1000 MCG tablet Take 1,000 mcg by mouth daily. Reported on 10/06/2015     No current facility-administered medications on file prior to visit.    Review of Systems  Constitutional: Negative for fever and chills.  Respiratory: Negative for cough, chest tightness and wheezing.   Cardiovascular: Negative for chest pain, palpitations and leg swelling.  Musculoskeletal:       Positive for muscle cramps      Objective:    BP 104/72 mmHg  Pulse 80  Temp(Src) 98.1 F (36.7 C) (Oral)  Resp 16  Ht 5\' 4"  (1.626 m)  Wt 276 lb (125.193 kg)  BMI 47.35 kg/m2  SpO2 97% Nursing note and vital signs reviewed.  Physical Exam  Constitutional: She is oriented to person, place, and time. She appears well-developed and well-nourished. No distress.  Cardiovascular: Normal rate, regular rhythm, normal heart sounds and intact distal pulses.   Pulmonary/Chest: Effort normal and breath sounds normal.  Musculoskeletal:  Bilateral lower extremities - no obvious deformity, discoloration, or edema noted. Mild tenderness of right calf with no significant heat, redness, or edema. Range of motion is within normal limits bilaterally. Distal pulses and sensation are intact and appropriate.  Neurological: She is alert and oriented to person, place, and time.  Skin: Skin is warm and dry.  Psychiatric: She has a normal mood and affect. Her behavior is normal. Judgment and thought content normal.       Assessment & Plan:   Problem List Items Addressed This Visit      Other   Muscle cramps - Primary    Symptoms of muscle spasm most likely related to  dehydration. Symptoms appear improved since decreasing Jardiance and starting a sports drink at night. Recommend decreasing caffeine intake. Stretch lower extremity 2-3 times per day and ice as needed for discomfort. Cannot rule out simvastatin as potential cause of muscle cramps/spasms or possible claudication. If conservative treatment does not reduce spasms, consider ankle brachial index or further vascular assessment.

## 2015-10-12 NOTE — Progress Notes (Signed)
Pre visit review using our clinic review tool, if applicable. No additional management support is needed unless otherwise documented below in the visit note. 

## 2015-10-12 NOTE — Assessment & Plan Note (Signed)
Symptoms of muscle spasm most likely related to dehydration. Symptoms appear improved since decreasing Jardiance and starting a sports drink at night. Recommend decreasing caffeine intake. Stretch lower extremity 2-3 times per day and ice as needed for discomfort. Cannot rule out simvastatin as potential cause of muscle cramps/spasms or possible claudication. If conservative treatment does not reduce spasms, consider ankle brachial index or further vascular assessment.

## 2015-10-12 NOTE — Patient Instructions (Addendum)
Thank you for choosing [facility].  Summary/Instructions:  Ice your calves as needed for discomfort. Stretch 2-3 times daily. Continue with gatorade. Recommend decrease caffeine intake. If symptoms worsen consider a week off of simvastatin.  If your symptoms worsen or fail to improve, please contact our office for further instruction, or in case of emergency go directly to the emergency room at the closest medical facility.   Leg Cramps Leg cramps occur when a muscle or muscles tighten and you have no control over this tightening (involuntary muscle contraction). Muscle cramps can develop in any muscle, but the most common place is in the calf muscles of the leg. Those cramps can occur during exercise or when you are at rest. Leg cramps are painful, and they may last for a few seconds to a few minutes. Cramps may return several times before they finally stop. Usually, leg cramps are not caused by a serious medical problem. In many cases, the cause is not known. Some common causes include:  Overexertion.  Overuse from repetitive motions, or doing the same thing over and over.  Remaining in a certain position for a long period of time.  Improper preparation, form, or technique while performing a sport or an activity.  Dehydration.  Injury.  Side effects of some medicines.  Abnormally low levels of the salts and ions in your blood (electrolytes), especially potassium and calcium. These levels could be low if you are taking water pills (diuretics) or if you are pregnant. HOME CARE INSTRUCTIONS Watch your condition for any changes. Taking the following actions may help to lessen any discomfort that you are feeling:  Stay well-hydrated. Drink enough fluid to keep your urine clear or pale yellow.  Try massaging, stretching, and relaxing the affected muscle. Do this for several minutes at a time.  For tight or tense muscles, use a warm towel, heating pad, or hot shower water  directed to the affected area.  If you are sore or have pain after a cramp, applying ice to the affected area may relieve discomfort.  Put ice in a plastic bag.  Place a towel between your skin and the bag.  Leave the ice on for 20 minutes, 2-3 times per day.  Avoid strenuous exercise for several days if you have been having frequent leg cramps.  Make sure that your diet includes the essential minerals for your muscles to work normally.  Take medicines only as directed by your health care provider. SEEK MEDICAL CARE IF:  Your leg cramps get more severe or more frequent, or they do not improve over time.  Your foot becomes cold, numb, or blue.   This information is not intended to replace advice given to you by your health care provider. Make sure you discuss any questions you have with your health care provider.   Document Released: 08/25/2004 Document Revised: 12/02/2014 Document Reviewed: 06/25/2014 Elsevier Interactive Patient Education [employer]2016 [employer] Inc.

## 2015-10-23 ENCOUNTER — Other Ambulatory Visit: Payer: Self-pay

## 2015-10-25 ENCOUNTER — Other Ambulatory Visit: Payer: Self-pay | Admitting: Endocrinology

## 2015-10-28 ENCOUNTER — Other Ambulatory Visit: Payer: Self-pay

## 2015-10-29 ENCOUNTER — Other Ambulatory Visit (INDEPENDENT_AMBULATORY_CARE_PROVIDER_SITE_OTHER): Payer: 59

## 2015-10-29 DIAGNOSIS — E1165 Type 2 diabetes mellitus with hyperglycemia: Secondary | ICD-10-CM

## 2015-10-29 LAB — BASIC METABOLIC PANEL
BUN: 12 mg/dL (ref 6–23)
CHLORIDE: 101 meq/L (ref 96–112)
CO2: 25 meq/L (ref 19–32)
Calcium: 9.4 mg/dL (ref 8.4–10.5)
Creatinine, Ser: 0.72 mg/dL (ref 0.40–1.20)
GFR: 112.88 mL/min (ref >60.00)
GLUCOSE: 115 mg/dL — AB (ref 70–99)
POTASSIUM: 3.9 meq/L (ref 3.5–5.1)
Sodium: 135 mEq/L (ref 135–145)

## 2015-10-29 LAB — MICROALBUMIN / CREATININE URINE RATIO
Creatinine,U: 179.9 mg/dL
Microalb Creat Ratio: 0.9 mg/g (ref 0.0–30.0)
Microalb, Ur: 1.6 mg/dL (ref 0.0–1.9)

## 2015-10-29 LAB — HEMOGLOBIN A1C: HEMOGLOBIN A1C: 8 % — AB (ref 4.6–6.5)

## 2015-11-02 ENCOUNTER — Ambulatory Visit (INDEPENDENT_AMBULATORY_CARE_PROVIDER_SITE_OTHER): Payer: 59 | Admitting: Endocrinology

## 2015-11-02 ENCOUNTER — Encounter: Payer: Self-pay | Admitting: Endocrinology

## 2015-11-02 VITALS — BP 116/80 | HR 72 | Temp 98.1°F | Resp 16 | Ht 64.0 in | Wt 281.0 lb

## 2015-11-02 DIAGNOSIS — E669 Obesity, unspecified: Secondary | ICD-10-CM

## 2015-11-02 DIAGNOSIS — E1165 Type 2 diabetes mellitus with hyperglycemia: Secondary | ICD-10-CM

## 2015-11-02 NOTE — Progress Notes (Signed)
Patient ID: Julia Estrada, female   DOB: 1971/03/27, 45 y.o.   MRN: [id number]           Reason for Appointment: Follow-up for Type 2 Diabetes  History of Present Illness:          Diagnosis: Type 2 diabetes mellitus, date of diagnosis: ?  2011        Past history:  Not clear when her diagnosis was first made but she probably did not have very high sugars. She was initially treated with metformin which was titrated to 2000 mg a day without side effects She was also sent to the diabetes classes at the time of diagnosis At some point she was also given Januvia which has been continued She was also tried on Victoza in 08/2012 but she could not tolerate it for more than a week because of significant nausea and vomiting.  She thinks she may have taken only the 0.6 mg dose Since in 2015 her A1c had been over 8% she was started on Jardiance  Recent history:   Oral hypoglycemic drugs the patient is taking are: Jardiance 10 mg, metformin ER 2 g      Since her A1c had been consistently over 7% she was started on Tanzeum on her visit in 9/16 This was increased to 50 mg in 11/16 She has taken 50 mg weekly without any nausea which she had with Victoza She was also told to increase her Jardiance up to 20 mg which she says that after few weeks with trying 20 mg she was having nocturnal cramps legs and she went back to 10 mg  Her A1c is still high at 8%, previously 8.3  Current blood sugar patterns and problems identified:  She is  on a study program with a new glucose meter that cannot be downloaded and does not keep a record except for the last 1 or 2 weeks  Although her blood sugars are looking fairly good in the morning most of the time, recently only up 230 she has only a few readings after meals  Some of her readings after meals are as high as 190  She thinks her sugars may have been better with 20 mg Jardiance  Also in the last 3 days with prednisone her sugars are little higher after  meals  She says that she has difficulty watching her diet and tends to eat sweets  She has not been walking which she was doing before  She still has difficulty losing weight.  Has gone up recently       Side effects from medications have been: Vomiting with Victoza which she tried for one week only     Compliance with the medical regimen: Fair  Hypoglycemia:   none  Glucose monitoring: About once a day or less   Blood Glucose readings as above     Self-care: The diet that the patient has been following is: none, does not always avoid sweets, may not always control high-fat foods Meals: 3 meals per day. Breakfast is oatmeal or biscuit,  lunch is chicken or pork chop  Has fruit cups chips or popcorn for snacks         Exercise: Has not been walking regularly  Dietician visit, most recent: 1/16              Weight history: Previous range 276 -309  Wt Readings from Last 3 Encounters:  11/02/15 281 lb (127.461 kg)  10/12/15 276 lb (125.193 kg)  10/06/15 276 lb 3.8 oz (125.3 kg)    Glycemic control:   Lab Results  Component Value Date   HGBA1C 8.0* 10/29/2015   HGBA1C 8.3* 09/01/2015   HGBA1C 7.5* 05/05/2015   Lab Results  Component Value Date   MICROALBUR 1.6 10/29/2015   LDLCALC 104* 05/05/2015   CREATININE 0.72 10/29/2015    Lab on 10/29/2015  Component Date Value Ref Range Status  . Hgb A1c MFr Bld 10/29/2015 8.0* 4.6 - 6.5 % Final   Glycemic Control Guidelines for People with Diabetes:Non Diabetic:  <6%Goal of Therapy: <7%Additional Action Suggested:  >8%   . Sodium 10/29/2015 135  135 - 145 mEq/L Final  . Potassium 10/29/2015 3.9  3.5 - 5.1 mEq/L Final  . Chloride 10/29/2015 101  96 - 112 mEq/L Final  . CO2 10/29/2015 25  19 - 32 mEq/L Final  . Glucose, Bld 10/29/2015 115* 70 - 99 mg/dL Final  . BUN 16/05/9603 12  6 - 23 mg/dL Final  . Creatinine, Ser 10/29/2015 0.72  0.40 - 1.20 mg/dL Final  . Calcium 54/04/8118 9.4  8.4 - 10.5 mg/dL Final  . GFR  14/78/2956 112.88  >60.00 mL/min Final  . Microalb, Ur 10/29/2015 1.6  0.0 - 1.9 mg/dL Final  . Creatinine,U 21/30/8657 179.9   Final  . Microalb Creat Ratio 10/29/2015 0.9  0.0 - 30.0 mg/g Final        Medication List       This list is accurate as of: 11/02/15 11:59 PM.  Always use your most recent med list.               Albiglutide 50 MG Pen  Commonly known as:  TANZEUM  Inject the contents of one pen once per week     albuterol 108 (90 Base) MCG/ACT inhaler  Commonly known as:  PROVENTIL HFA;VENTOLIN HFA  Inhale 2 puffs into the lungs every 6 (six) hours as needed for wheezing or shortness of breath.     azelastine 0.1 % nasal spray  Commonly known as:  ASTELIN  Place 1 spray into both nostrils 2 (two) times daily. Use in each nostril as directed     Beclomethasone Dipropionate 80 MCG/ACT Aers  Commonly known as:  QNASL  Place 1 spray into the nose 2 (two) times daily.     cholecalciferol 1000 units tablet  Commonly known as:  VITAMIN D  Take 1 tablet (1,000 Units total) by mouth daily.     diphenhydrAMINE 25 MG tablet  Commonly known as:  BENADRYL  Take 1 tablet (25 mg total) by mouth every 6 (six) hours as needed for itching or allergies (hives).     empagliflozin 25 MG Tabs tablet  Commonly known as:  JARDIANCE  Take 25 mg by mouth daily.     ergocalciferol 50000 units capsule  Commonly known as:  VITAMIN D2  Take 1 capsule (50,000 Units total) by mouth once a week.     ferrous sulfate 325 (65 FE) MG tablet  Take 325 mg by mouth daily with breakfast.     fexofenadine 180 MG tablet  Commonly known as:  ALLEGRA  Take 180 mg by mouth daily as needed for allergies.     fluconazole 150 MG tablet  Commonly known as:  DIFLUCAN  TAKE 1 TABLET BY MOUTH ONCE     fluticasone 50 MCG/ACT nasal spray  Commonly known as:  FLONASE  Place 2 sprays into both nostrils daily.     glucose blood  test strip  Commonly known as:  ONETOUCH VERIO  Use as instructed to  check blood sugar twice a day dx code E11.65     ibuprofen 800 MG tablet  Commonly known as:  ADVIL,MOTRIN  Take 800 mg by mouth every 8 (eight) hours as needed for fever, headache or mild pain.     metFORMIN 500 MG 24 hr tablet  Commonly known as:  GLUCOPHAGE-XR  Take 4 tablets by mouth  daily     methylPREDNISolone 4 MG Tbpk tablet  Commonly known as:  MEDROL DOSEPAK  Take as directed     montelukast 10 MG tablet  Commonly known as:  SINGULAIR  Take 1 tablet (10 mg total) by mouth at bedtime.     NASAL SPRAY SALINE NA  Place into the nose.     ONETOUCH DELICA LANCETS FINE Misc  Use 2 per day dx code E11.65     pantoprazole 40 MG tablet  Commonly known as:  PROTONIX  Take 1 tablet by mouth  daily     predniSONE 10 MG tablet  Commonly known as:  DELTASONE     ranitidine 300 MG tablet  Commonly known as:  ZANTAC  Take 1 tablet by mouth at  bedtime     simvastatin 20 MG tablet  Commonly known as:  ZOCOR  Take 1 tablet by mouth at  bedtime     spironolactone 100 MG tablet  Commonly known as:  ALDACTONE  Take 1 tablet by mouth  daily     triamcinolone cream 0.5 %  Commonly known as:  KENALOG  Apply 1 application topically 3 (three) times daily. On rash     triamcinolone ointment 0.1 %  Commonly known as:  KENALOG  APPLY A THIN LAYER TO AFFECTED AREA TWICE DAILY     valACYclovir 1000 MG tablet  Commonly known as:  VALTREX  Take 1,000 mg by mouth daily.     vitamin B-12 1000 MCG tablet  Commonly known as:  CYANOCOBALAMIN  Take 1,000 mcg by mouth daily. Reported on 10/06/2015        Allergies:  Allergies  Allergen Reactions  . Codeine Sulfate Nausea And Vomiting  . Hydrochlorothiazide W-Triamterene     REACTION: cramping  . Oxycodone-Acetaminophen Nausea And Vomiting  . Prednisone Hives  . Victoza [Liraglutide]     n/v    Past Medical History  Diagnosis Date  . Anemia     iron deficiency  . Obesity     BMI=44  . Vitamin B12 deficiency   .  Vitamin D deficiency   . Depression 2011  . Diabetes mellitus 2010    type II  . GERD (gastroesophageal reflux disease) 2004  . Hypertension 2008  . Hyperlipidemia   . Shortness of breath     with exercise  . Anxiety   . Sleep apnea     uses CPAP every night    Past Surgical History  Procedure Laterality Date  . Fibroidectomy  2002    laparotomy for removal of fibroids  . Tonsillectomy and adenoidectomy    . Cesarean section  2004    x 1  . Myomectomy  2002  . Shoulder arthroscopy Left 04/30/2014    Procedure: Left Shoulder Arthroscopy and Debridement;  Surgeon: Nadara MustardMarcus Duda V, MD;  [facility];  Service: Orthopedics;  Laterality: Left;  . Wisdom tooth extraction    . Upper gi endoscopy    . Colonoscopy    . Dilitation & currettage/hystroscopy with  novasure ablation N/A 01/09/2015    Procedure: DILATATION & CURETTAGE/HYSTEROSCOPY WITH NOVASURE ABLATION;  Surgeon: Donovan Kail, MD;  Location: [facility];  Service: Gynecology;  Laterality: N/A;    Family History  Problem Relation Age of Onset  . Hypertension Mother     father, sisters  . Diabetes Mother     sister  . Kidney cancer Mother   . Cancer Mother   . Cholelithiasis Sister   . Allergic rhinitis Sister   . Clotting disorder Daughter   . Urticaria Daughter   . Heart disease Maternal Uncle   . Heart disease Maternal Grandfather     Social History:  reports that she has never smoked. She has never used smokeless tobacco. She reports that she does not drink alcohol or use illicit drugs.    Review of Systems        Lipids:  Her LDL is treated by PCP with  Zocor She does have a family history of CAD      Lab Results  Component Value Date   CHOL 166 05/05/2015   HDL 40.40 05/05/2015   LDLCALC 104* 05/05/2015   LDLDIRECT 72.0 09/24/2014   TRIG 107.0 05/05/2015   CHOLHDL 4 05/05/2015                  The blood pressure has been treated with Aldactone since 2008.  Also on 10 mg Jardiance   Continues to take  100 mg Aldactone and potassium is Normal Urine microalbumin has been normal    Lab Results  Component Value Date   CREATININE 0.72 10/29/2015   BUN 12 10/29/2015   NA 135 10/29/2015   K 3.9 10/29/2015   CL 101 10/29/2015   CO2 25 10/29/2015        Physical Examination:  BP 116/80 mmHg  Pulse 72  Temp(Src) 98.1 F (36.7 C)  Resp 16  Ht 5\' 4"  (1.626 m)  Wt 281 lb (127.461 kg)  BMI 48.21 kg/m2  SpO2 96%        ASSESSMENT:  Diabetes type 2, uncontrolled    See history of present illness for detailed discussion of his current management, blood sugar patterns and problems identified Her blood sugars are still poorly controlled with A1c 8% This is likely be from postprandial hyperglycemia which she does not monitor usually Most of her high blood sugars are related to poor compliance with diet and eating sweets She has gained weight No consistent exercise also She may have benefited some from higher dose of Jardiance but she thinks this caused muscle cramps  Possibly had muscle cramps with higher dose of Jardiance because of combining with her Aldactone  Hypertension: Controlled with Aldactone 100 mg and electrolytes are stable  PLAN:   Increase Jardiance to 20 mg but at the same time reduce Aldactone to 50 mg  She has no cramps she can change Jardiance to 25 mg  Avoid drinking Gatorade which she is doing  May try tonic water water at night  Start regular exercise  Keep a diary of blood sugars more consistently  Continue Tanzeum  50 mg  More glucose monitoring after meals  Consider adding insulin if she has persistently high postprandial readings  Will need short-term follow-up to assess her level of control  Patient Instructions  Stop Gatorade, use Tonic water  Try 2 pills of Jardiance and cut Spiro in 1/2   Check blood sugars on waking up 3-4  times a week Also check blood sugars about  2 hours after a meal and do this after different meals by  rotation  Recommended blood sugar levels on waking up is 90-130 and about 2 hours after meal is 130-160  Please bring your blood sugar monitor to each visit, thank you  Metfomin lunch and supper    Counseling time on subjects discussed above is over 50% of today's 25 minute visit       Julia Estrada 11/03/2015, 12:59 PM   Note: This office note was prepared with Insurance underwriter. Any transcriptional errors that result from this process are unintentional.

## 2015-11-02 NOTE — Patient Instructions (Addendum)
Stop Gatorade, use Tonic water  Try 2 pills of Jardiance and cut Spiro in 1/2   Check blood sugars on waking up 3-4  times a week Also check blood sugars about 2 hours after a meal and do this after different meals by rotation  Recommended blood sugar levels on waking up is 90-130 and about 2 hours after meal is 130-160  Please bring your blood sugar monitor to each visit, thank you  Metfomin lunch and supper

## 2015-11-09 ENCOUNTER — Telehealth: Payer: Self-pay | Admitting: Endocrinology

## 2015-11-09 ENCOUNTER — Other Ambulatory Visit: Payer: Self-pay | Admitting: Endocrinology

## 2015-11-09 MED ORDER — FLUCONAZOLE 150 MG PO TABS: ORAL_TABLET | ORAL | Status: DC

## 2015-11-09 NOTE — Telephone Encounter (Signed)
Please see below and advise.

## 2015-11-09 NOTE — Telephone Encounter (Signed)
Yes, she does have a yeast infection, she is also taking the Jardiance.

## 2015-11-09 NOTE — Telephone Encounter (Signed)
Is she having a yeast infection?  If she is then she can have the Diflucan Also a she able to take 20 mg of Jardiance recently? Her next prescription needs to be 25 mg once a day

## 2015-11-09 NOTE — Telephone Encounter (Signed)
Patient ask if Dr Lucianne MussKumar can call her in medication fluconazole (DIFLUCAN) 150 MG tablet, with the two pills in a pack, where she take one and the other three days later. Send to  [id number]- [place], [place] - [address] [phone] (Phone) [phone] (Fax)

## 2015-11-23 ENCOUNTER — Other Ambulatory Visit: Payer: Self-pay | Admitting: Family

## 2015-11-23 DIAGNOSIS — M545 Low back pain: Secondary | ICD-10-CM

## 2015-11-29 ENCOUNTER — Ambulatory Visit
Admission: RE | Admit: 2015-11-29 | Discharge: 2015-11-29 | Disposition: A | Discharge status: Home / Self Care | Payer: 59 | Source: Ambulatory Visit | Attending: Family | Admitting: Family

## 2015-11-29 DIAGNOSIS — M545 Low back pain: Secondary | ICD-10-CM

## 2015-12-15 ENCOUNTER — Ambulatory Visit (INDEPENDENT_AMBULATORY_CARE_PROVIDER_SITE_OTHER): Payer: 59 | Admitting: Internal Medicine

## 2015-12-15 ENCOUNTER — Encounter: Payer: Self-pay | Admitting: Internal Medicine

## 2015-12-15 ENCOUNTER — Other Ambulatory Visit (INDEPENDENT_AMBULATORY_CARE_PROVIDER_SITE_OTHER): Payer: 59

## 2015-12-15 VITALS — BP 118/60 | HR 90 | Wt 280.0 lb

## 2015-12-15 DIAGNOSIS — E1100 Type 2 diabetes mellitus with hyperosmolarity without nonketotic hyperglycemic-hyperosmolar coma (NKHHC): Secondary | ICD-10-CM

## 2015-12-15 DIAGNOSIS — R350 Frequency of micturition: Secondary | ICD-10-CM

## 2015-12-15 LAB — URINALYSIS, ROUTINE W REFLEX MICROSCOPIC
Bilirubin Urine: NEGATIVE
Ketones, ur: NEGATIVE
Leukocytes, UA: NEGATIVE
NITRITE: NEGATIVE
SPECIFIC GRAVITY, URINE: 1.015 (ref 1.000–1.030)
Total Protein, Urine: NEGATIVE
Urobilinogen, UA: 0.2 (ref 0.0–1.0)
WBC UA: NONE SEEN (ref 0–?)
pH: 6 (ref 5.0–8.0)

## 2015-12-15 LAB — BASIC METABOLIC PANEL
BUN: 12 mg/dL (ref 6–23)
CHLORIDE: 102 meq/L (ref 96–112)
CO2: 24 meq/L (ref 19–32)
CREATININE: 0.68 mg/dL (ref 0.40–1.20)
Calcium: 9.5 mg/dL (ref 8.4–10.5)
GFR: 120.5 mL/min (ref >60.00)
GLUCOSE: 160 mg/dL — AB (ref 70–99)
POTASSIUM: 4 meq/L (ref 3.5–5.1)
Sodium: 137 mEq/L (ref 135–145)

## 2015-12-15 LAB — HEMOGLOBIN A1C: HEMOGLOBIN A1C: 8.5 % — AB (ref 4.6–6.5)

## 2015-12-15 MED ORDER — MIRABEGRON ER 50 MG PO TB24: 50.0000 mg | ORAL_TABLET | Freq: Every day | ORAL | Status: DC

## 2015-12-15 MED ORDER — CIPROFLOXACIN HCL 250 MG PO TABS: 250.0000 mg | ORAL_TABLET | Freq: Two times a day (BID) | ORAL | Status: DC

## 2015-12-15 NOTE — Assessment & Plan Note (Signed)
Labs Hold Spironolactone Try Myrbetriq

## 2015-12-15 NOTE — Assessment & Plan Note (Signed)
Metformin, Trajenta, Gardiance  3/16 - better

## 2015-12-15 NOTE — Progress Notes (Signed)
Subjective:  Patient ID: Julia Estrada, female    DOB: 1971-05-22  Age: 45 y.o. MRN: [id number]  CC: Nocturia   HPI Julia Estrada presents for a lot of urinary frequency during the day and night x 3 months. F/u DM, HTN CBGs are up lately 72-211. C/o incontinence   Outpatient Prescriptions Prior to Visit  Medication Sig Dispense Refill  . Albiglutide (TANZEUM) 50 MG PEN Inject the contents of one pen once per week 12 each 1  . albuterol (PROVENTIL HFA;VENTOLIN HFA) 108 (90 Base) MCG/ACT inhaler Inhale 2 puffs into the lungs every 6 (six) hours as needed for wheezing or shortness of breath. 1 Inhaler 1  . azelastine (ASTELIN) 0.1 % nasal spray Place 1 spray into both nostrils 2 (two) times daily. Use in each nostril as directed    . Beclomethasone Dipropionate (QNASL) 80 MCG/ACT AERS Place 1 spray into the nose 2 (two) times daily. 8.7 g 5  . cholecalciferol (VITAMIN D) 1000 UNITS tablet Take 1 tablet (1,000 Units total) by mouth daily. 100 tablet 3  . diphenhydrAMINE (BENADRYL) 25 MG tablet Take 1 tablet (25 mg total) by mouth every 6 (six) hours as needed for itching or allergies (hives). 100 tablet 1  . ferrous sulfate 325 (65 FE) MG tablet Take 325 mg by mouth daily with breakfast.    . fexofenadine (ALLEGRA) 180 MG tablet Take 180 mg by mouth daily as needed for allergies.     . fluconazole (DIFLUCAN) 150 MG tablet TAKE 1 TABLET BY MOUTH ONCE 1 tablet 1  . fluconazole (DIFLUCAN) 150 MG tablet Take 1 tablet by mouth, then 3 days later take the second pill 2 tablet 0  . fluticasone (FLONASE) 50 MCG/ACT nasal spray Place 2 sprays into both nostrils daily.    Marland Kitchen glucose blood (ONETOUCH VERIO) test strip Use as instructed to check blood sugar twice a day dx code E11.65 200 each 1  . ibuprofen (ADVIL,MOTRIN) 800 MG tablet Take 800 mg by mouth every 8 (eight) hours as needed for fever, headache or mild pain.     . metFORMIN (GLUCOPHAGE-XR) 500 MG 24 hr tablet Take 4 tablets by mouth  daily 360  tablet 0  . montelukast (SINGULAIR) 10 MG tablet Take 1 tablet (10 mg total) by mouth at bedtime. 30 tablet 5  . NASAL SPRAY SALINE NA Place into the nose.    Letta Pate DELICA LANCETS FINE MISC Use 2 per day dx code E11.65 200 each 1  . pantoprazole (PROTONIX) 40 MG tablet Take 1 tablet by mouth  daily 90 tablet 2  . ranitidine (ZANTAC) 300 MG tablet Take 1 tablet by mouth at  bedtime 90 tablet 3  . simvastatin (ZOCOR) 20 MG tablet Take 1 tablet by mouth at  bedtime 90 tablet 0  . spironolactone (ALDACTONE) 100 MG tablet Take 1 tablet by mouth  daily 90 tablet 3  . triamcinolone cream (KENALOG) 0.5 % Apply 1 application topically 3 (three) times daily. On rash 90 g 1  . triamcinolone ointment (KENALOG) 0.1 % APPLY A THIN LAYER TO AFFECTED AREA TWICE DAILY  1  . valACYclovir (VALTREX) 1000 MG tablet Take 1,000 mg by mouth daily.     . vitamin B-12 (CYANOCOBALAMIN) 1000 MCG tablet Take 1,000 mcg by mouth daily. Reported on 10/06/2015    . empagliflozin (JARDIANCE) 25 MG TABS tablet Take 25 mg by mouth daily. (Patient not taking: Reported on 12/15/2015) 90 tablet 1  . ergocalciferol (VITAMIN D2) 50000  UNITS capsule Take 1 capsule (50,000 Units total) by mouth once a week. (Patient not taking: Reported on 12/15/2015) 6 capsule 0  . methylPREDNISolone (MEDROL DOSEPAK) 4 MG TBPK tablet Take as directed (Patient not taking: Reported on 12/15/2015) 21 tablet 0  . predniSONE (DELTASONE) 10 MG tablet Reported on 12/15/2015     No facility-administered medications prior to visit.    ROS Review of Systems  Constitutional: Negative for chills, activity change, appetite change, fatigue and unexpected weight change.  HENT: Negative for congestion, mouth sores and sinus pressure.   Eyes: Negative for visual disturbance.  Respiratory: Negative for cough and chest tightness.   Gastrointestinal: Negative for nausea and abdominal pain.  Genitourinary: Positive for urgency and frequency. Negative for difficulty  urinating and vaginal pain.  Musculoskeletal: Negative for back pain and gait problem.  Skin: Negative for pallor and rash.  Neurological: Negative for dizziness, tremors, weakness, numbness and headaches.  Psychiatric/Behavioral: Negative for suicidal ideas, confusion and sleep disturbance.    Objective:  BP 118/60 mmHg  Pulse 90  Wt 280 lb (127.007 kg)  SpO2 99%  LMP 11/09/2015  BP Readings from Last 3 Encounters:  12/15/15 118/60  11/02/15 116/80  10/12/15 104/72    Wt Readings from Last 3 Encounters:  12/15/15 280 lb (127.007 kg)  11/02/15 281 lb (127.461 kg)  10/12/15 276 lb (125.193 kg)    Physical Exam  Constitutional: She appears well-developed. No distress.  HENT:  Head: Normocephalic.  Right Ear: External ear normal.  Left Ear: External ear normal.  Nose: Nose normal.  Mouth/Throat: Oropharynx is clear and moist.  Eyes: Conjunctivae are normal. Pupils are equal, round, and reactive to light. Right eye exhibits no discharge. Left eye exhibits no discharge.  Neck: Normal range of motion. Neck supple. No JVD present. No tracheal deviation present. No thyromegaly present.  Cardiovascular: Normal rate, regular rhythm and normal heart sounds.   Pulmonary/Chest: No stridor. No respiratory distress. She has no wheezes.  Abdominal: Soft. Bowel sounds are normal. She exhibits no distension and no mass. There is no tenderness. There is no rebound and no guarding.  Musculoskeletal: She exhibits no edema or tenderness.  Lymphadenopathy:    She has no cervical adenopathy.  Neurological: She displays normal reflexes. No cranial nerve deficit. She exhibits normal muscle tone. Coordination normal.  Skin: No rash noted. No erythema.  Psychiatric: She has a normal mood and affect. Her behavior is normal. Judgment and thought content normal.  Obese  Lab Results  Component Value Date   WBC 6.7 05/05/2015   HGB 9.9* 05/05/2015   HCT 31.9* 05/05/2015   PLT 385.0 05/05/2015    GLUCOSE 115* 10/29/2015   CHOL 166 05/05/2015   TRIG 107.0 05/05/2015   HDL 40.40 05/05/2015   LDLDIRECT 72.0 09/24/2014   LDLCALC 104* 05/05/2015   ALT 22 06/02/2015   AST 11 06/02/2015   NA 135 10/29/2015   K 3.9 10/29/2015   CL 101 10/29/2015   CREATININE 0.72 10/29/2015   BUN 12 10/29/2015   CO2 25 10/29/2015   TSH 0.57 05/05/2015   INR 1.02 04/22/2014   HGBA1C 8.0* 10/29/2015   MICROALBUR 1.6 10/29/2015    Mr Lumbar Spine Wo Contrast  11/29/2015  CLINICAL DATA:  Low back pain, left hip pain, buttock pain EXAM: MRI LUMBAR SPINE WITHOUT CONTRAST TECHNIQUE: Multiplanar, multisequence MR imaging of the lumbar spine was performed. No intravenous contrast was administered. COMPARISON:  None. FINDINGS: Segmentation: Conventional anatomy assistant, with the last open disc  space designated L5-S1. Alignment: The vertebral bodies of the lumbar spine are normal in alignment. Bones: The vertebral bodies of the lumbar spine are normal in size. Diffuse mild T1 hypointense marrow is present. Although this can be caused by marrow infiltrative processes, the most common causes include anemia, smoking, obesity, or advancing age. The visualized portions of the SI joints are unremarkable. Conus medullaris: Extends to the L1 level and appears normal. The nerve roots of the cauda equina and the filum terminale are normal. Paraspinal and other soft tissues: There is no focal abnormality. The imaged intra-abdominal contents are unremarkable. Disc levels: Disc spaces: Disc heights are maintained. T12-L1: No significant disc bulge. No evidence of neural foraminal stenosis. No central canal stenosis. L1-L2: No significant disc bulge. No evidence of neural foraminal stenosis. No central canal stenosis. L2-L3: No significant disc bulge. No evidence of neural foraminal stenosis. No central canal stenosis. L3-L4: No significant disc bulge. No evidence of neural foraminal stenosis. No central canal stenosis. L4-L5: Minimal  broad-based disc bulge. Mild bilateral facet arthropathy. No evidence of neural foraminal stenosis. No central canal stenosis. L5-S1: No significant disc bulge. No evidence of neural foraminal stenosis. No central canal stenosis. Moderate right and mild left facet arthropathy. IMPRESSION: 1. No significant disc protrusion, foraminal stenosis or central canal stenosis. 2. At L5-S1 there is moderate right and mild left facet arthropathy. 3. Diffuse mild T1 hypointense marrow is present. Although this can be caused by marrow infiltrative processes, the most common causes include anemia, smoking, obesity, or advancing age. Electronically Signed   By: Elige Ko   On: 11/29/2015 17:43    Assessment & Plan:   There are no diagnoses linked to this encounter. I have discontinued Ms. Principato's ergocalciferol, methylPREDNISolone, and predniSONE. I am also having her maintain her ibuprofen, ferrous sulfate, vitamin B-12, diphenhydrAMINE, triamcinolone cream, valACYclovir, fexofenadine, fluconazole, cholecalciferol, glucose blood, ONETOUCH DELICA LANCETS FINE, spironolactone, ranitidine, albuterol, fluticasone, pantoprazole, Albiglutide, azelastine, NASAL SPRAY SALINE NA, Beclomethasone Dipropionate, montelukast, simvastatin, metFORMIN, triamcinolone ointment, fluconazole, PREVIFEM, and empagliflozin.  Meds ordered this encounter  Medications  . PREVIFEM 0.25-35 MG-MCG tablet    Sig: Take 1 tablet by mouth daily.  . empagliflozin (JARDIANCE) 10 MG TABS tablet    Sig: Take 10 mg by mouth daily.     Follow-up: No Follow-up on file.  Sonda Primes, MD

## 2015-12-15 NOTE — Patient Instructions (Signed)
Hold Spironolactone Try Myrbetriq

## 2015-12-15 NOTE — Progress Notes (Signed)
Pre visit review using our clinic review tool, if applicable. No additional management support is needed unless otherwise documented below in the visit note. 

## 2016-01-06 ENCOUNTER — Ambulatory Visit: Payer: 59 | Attending: Orthopedic Surgery | Admitting: Physical Therapy

## 2016-01-06 DIAGNOSIS — M25612 Stiffness of left shoulder, not elsewhere classified: Secondary | ICD-10-CM | POA: Diagnosis present

## 2016-01-06 DIAGNOSIS — M6281 Muscle weakness (generalized): Secondary | ICD-10-CM

## 2016-01-06 DIAGNOSIS — M25552 Pain in left hip: Secondary | ICD-10-CM | POA: Diagnosis not present

## 2016-01-06 DIAGNOSIS — M25512 Pain in left shoulder: Secondary | ICD-10-CM | POA: Insufficient documentation

## 2016-01-06 DIAGNOSIS — R531 Weakness: Secondary | ICD-10-CM | POA: Insufficient documentation

## 2016-01-07 NOTE — Therapy (Signed)
[facility] Outpatient [facility] [address] [place], [place], [address] Phone: [phone]   Fax:  [phone]  Physical Therapy Evaluation  Patient Details  Name: Julia Estrada MRN: [id number] Date of Birth: 10/29/1970 Referring Provider: Dr. Lajoyce Corners   Encounter Date: 01/06/2016      PT End of Session - 01/06/16 1532    Visit Number 1   Number of Visits 16   Date for PT Re-Evaluation 03/02/16   PT Start Time 1308   PT Stop Time 1405   PT Time Calculation (min) 57 min   Activity Tolerance Patient tolerated treatment well  min pain increase 3/10, better after cold pack    Behavior During Therapy [facility] for tasks assessed/performed      Past Medical History  Diagnosis Date  . Anemia     iron deficiency  . Obesity     BMI=44  . Vitamin B12 deficiency   . Vitamin D deficiency   . Depression 2011  . Diabetes mellitus 2010    type II  . GERD (gastroesophageal reflux disease) 2004  . Hypertension 2008  . Hyperlipidemia   . Shortness of breath     with exercise  . Anxiety   . Sleep apnea     uses CPAP every night    Past Surgical History  Procedure Laterality Date  . Fibroidectomy  2002    laparotomy for removal of fibroids  . Tonsillectomy and adenoidectomy    . Cesarean section  2004    x 1  . Myomectomy  2002  . Shoulder arthroscopy Left 04/30/2014    Procedure: Left Shoulder Arthroscopy and Debridement;  Surgeon: Nadara Mustard, MD;  Location: [facility] OR;  Service: Orthopedics;  Laterality: Left;  . Wisdom tooth extraction    . Upper gi endoscopy    . Colonoscopy    . Dilitation & currettage/hystroscopy with novasure ablation N/A 01/09/2015    Procedure: DILATATION & CURETTAGE/HYSTEROSCOPY WITH NOVASURE ABLATION;  Surgeon: Donovan Kail, MD;  Location: [facility] ORS;  Service: Gynecology;  Laterality: N/A;    There were no vitals filed for this visit.       Subjective Assessment - 01/06/16 1311    Subjective Patient with recurrant low back pain  L side into L LE (2-3 mos ago).  She endorses weakness in L hip and occ shooting pain to knee.  She has difficulty with sitting long periods and standing, cooking and basic mobility.  She states today is a "good day" .  She is involved in a diabetes program through [facility] that encourages exercise, diet.     Pertinent History Rt. shoulder surgery, obesity, DM, HTN, depression    Limitations Sitting;Lifting;Standing;Walking;House hold activities;Other (comment)  Work, walking up stairs    How long can you sit comfortably? depends on the day    How long can you stand comfortably? standing still varies 5-20 min    How long can you walk comfortably? depends, up to 15-20 min    Diagnostic tests MRI done 11/29/15, spondylosis in L spine, mild to mod L4-L5-S1   Currently in Pain? No/denies   Pain Score 5   typical    Pain Location Hip   Pain Orientation Left   Pain Descriptors / Indicators Aching   Pain Type Chronic pain   Pain Onset More than a month ago   Pain Frequency Intermittent   Aggravating Factors  prolonged positioning    Pain Relieving Factors dec weightbearing, meds   Effect of Pain on  Daily Activities interferes with job and home tasks             [facility] PT Assessment - 01/06/16 1316    Assessment   Medical Diagnosis lumbar spondylosis    Referring Provider Dr. Lajoyce Corners    Onset Date/Surgical Date --  chronic   Prior Therapy No    Precautions   Precautions None   Restrictions   Weight Bearing Restrictions No   Balance Screen   Has the patient fallen in the past 6 months No   Has the patient had a decrease in activity level because of a fear of falling?  No   Is the patient reluctant to leave their home because of a fear of falling?  No   Home [employer]ist information centre manager residence   Prior Function   Level of Independence Independent   Cognition   Overall Cognitive Status Within Functional Limits for tasks assessed   Observation/Other Assessments   Focus on  Therapeutic Outcomes (FOTO)  44%   Sensation   Light Touch Appears Intact   Coordination   Gross Motor Movements are Fluid and Coordinated Not tested   Posture/Postural Control   Posture/Postural Control Postural limitations   Posture Comments high L iliac crest    AROM   Lumbar Flexion 65   Lumbar Extension 15   Lumbar - Right Side Bend 15   Lumbar - Left Side Bend 30   Lumbar - Right Rotation WNL   Lumbar - Left Rotation WNL    Strength   Right Hip Flexion 4/5   Right Hip ABduction 4+/5   Left Hip Flexion 3+/5   Left Hip ABduction 4+/5   Right Knee Flexion 5/5   Right Knee Extension 5/5   Left Knee Flexion 4/5   Left Knee Extension 4/5   Right Ankle Dorsiflexion 5/5   Left Ankle Dorsiflexion 4+/5   Flexibility   Hamstrings 65-75   Quadriceps tight L hip, hip flexor tight, pos prone knee bend   Palpation   Spinal mobility hypomobile throughout    SI assessment  min tenderess, no pain   Palpation comment sore lateral L SI border                    [facility] Adult PT Treatment/Exercise - 01/06/16 1316    Self-Care   Self-Care Heat/Ice Application;Other Self-Care Comments   Heat/Ice Application ice for incr pain   Other Self-Care Comments  PT findings, positioning    Knee/Hip Exercises: Stretches   Active Hamstring Stretch Left;2 reps;30 seconds   Hip Flexor Stretch Left;2 reps;30 seconds   Cryotherapy   Number Minutes Cryotherapy 10 Minutes   Cryotherapy Location Hip   Type of Cryotherapy Ice pack                PT Education - 01/06/16 1531    Education provided Yes   Education Details PT/POC, HEP , self care   Person(s) Educated Patient   Methods Explanation;Demonstration;Handout   Comprehension Verbalized understanding;Returned demonstration;Need further instruction          PT Short Term Goals - 01/07/16 1013    PT SHORT TERM GOAL #1   Title Independent with initial HEP for hip, trunk.    Time 4   Period Weeks   Status New   PT SHORT  TERM GOAL #2   Title Pain decreased 25% with work duties, including sitting for >15 min    Time 4   Status New  PT SHORT TERM GOAL #3   Title Pt will be able to move on mat table (rolll, transfer) without increased pain using good body mechanics    Time 4   Period Weeks   Status New   PT SHORT TERM GOAL #4   Title Pt will be able to walk up stairs to her apt with no increase in pain, 25% less fatigue, dyspnea.    Time 4   Period Weeks   Status New   PT SHORT TERM GOAL #5   Title Pt will score less than 30% limited on FOTO    Time 4   Period Weeks   Status New                  Plan - 01/07/16 1004    Clinical Impression Statement This patient presents for low complexity eval of low back and L hip pain which has been intermittent for some time.  She reports a recent onset a couple of months ago.  Examination and simple AROM of trunk increased pain to 3/10.  She has recently begun a new exercise program but that seems to have improved her pain.  She has less pain overall if she is free to change positions.  She responds well to spinal extension in prone, pain increases in sitting at work (ED).  She will do well with corrective exercises and education on safety, lifting and posture.    Rehab Potential Excellent   PT Frequency 2x / week   PT Duration 8 weeks   PT Treatment/Interventions Ultrasound;Traction;Balance training;Neuromuscular re-education;Patient/family education;Cryotherapy;Moist Heat;Therapeutic exercise;Manual techniques;Taping;Therapeutic activities;Electrical Stimulation;Dry needling   PT Next Visit Plan develop HEP for core, check hip flexibility (measure?) and basic balance screen, posture ed in sitting    PT Home Exercise Plan hip flexor and hamstring stretch    Consulted and Agree with Plan of Care Patient      Patient will benefit from skilled therapeutic intervention in order to improve the following deficits and impairments:  Increased fascial  restricitons, Obesity, Pain, Impaired flexibility, Impaired sensation, Decreased strength, Decreased mobility, Improper body mechanics, Decreased balance  Visit Diagnosis: Pain in left hip  Muscle weakness (generalized)     Problem List Patient Active Problem List   Diagnosis Date Noted  . Frequency of urination 12/15/2015  . Muscle cramps 10/12/2015  . Wheezing 08/04/2015  . Cough 08/04/2015  . Well adult exam 05/12/2015  . Dyslipidemia 10/03/2014  . Allergic rhinitis 04/12/2014  . Mild persistent asthma 04/12/2014  . Dyspnea 04/12/2014  . Left shoulder pain 02/07/2014  . 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 Intertrigo 03/30/2012  . OSA on CPAP 03/30/2012  . GERD (gastroesophageal reflux disease) 04/12/2011  . Abdominal pain, epigastric 05/04/2010  . Unspecified eustachian tube disorder 02/23/2010  . OTITIS MEDIA, LEFT 02/23/2010  . INTERMITTENT VERTIGO 02/23/2010  . NAUSEA 01/18/2010  . LUQ PAIN 01/18/2010  . 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 CHEST PAIN 10/05/2009  . DEPRESSION 09/07/2009  . Obesity 07/17/2009  . Diabetes type 2, uncontrolled (HCC) 04/14/2009  . 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 ELEVATED BP 10/02/2008  . B12 deficiency 08/30/2007  . Vitamin D deficiency 08/30/2007  . HAIR LOSS 08/30/2007  . PARESTHESIA 08/30/2007  . WEIGHT GAIN 08/30/2007  . [facility] DEFICIENCY 05/28/2007    Len Kluver 01/07/2016, 10:21 AM  [facility] [address] [place], [place], [address] Phone: [phone]   Fax:  [phone]  Name: NATANE HEWARD MRN: [id number] Date of Birth: [birth year]  Karie MainlandJennifer Laurian Edrington, PT 01/07/2016 10:21 AM Phone: [phone] Fax: [phone]

## 2016-01-11 ENCOUNTER — Ambulatory Visit: Payer: 59 | Admitting: Physical Therapy

## 2016-01-11 DIAGNOSIS — M25552 Pain in left hip: Secondary | ICD-10-CM

## 2016-01-11 DIAGNOSIS — R531 Weakness: Secondary | ICD-10-CM

## 2016-01-11 DIAGNOSIS — M25512 Pain in left shoulder: Secondary | ICD-10-CM

## 2016-01-11 DIAGNOSIS — M6281 Muscle weakness (generalized): Secondary | ICD-10-CM

## 2016-01-11 DIAGNOSIS — M25612 Stiffness of left shoulder, not elsewhere classified: Secondary | ICD-10-CM

## 2016-01-11 NOTE — Therapy (Signed)
Outpatient Rehabilitation [facility] [address] [place], [place], 1[facility]: [phone]   Fax:  [phone]  Physical Therapy Treatment  Patient Details  Name: Julia Estrada MRN: [id number] Date of Birth: Jul 30, 1971 Referring Provider: Dr. Lajoyce Corners   Encounter Date: 01/11/2016      PT End of Session - 01/11/16 0804    Visit Number 2   Number of Visits 16   Date for PT Re-Evaluation 03/02/16   PT Start Time 0800   PT Stop Time 0846   PT Time Calculation (min) 46 min   Activity Tolerance Patient tolerated treatment well   Behavior During Therapy [facility] for tasks assessed/performed      Past Medical History  Diagnosis Date  . Anemia     iron deficiency  . Obesity     BMI=44  . Vitamin B12 deficiency   . Vitamin D deficiency   . Depression 2011  . Diabetes mellitus 2010    type II  . GERD (gastroesophageal reflux disease) 2004  . Hypertension 2008  . Hyperlipidemia   . Shortness of breath     with exercise  . Anxiety   . Sleep apnea     uses CPAP every night    Past Surgical History  Procedure Laterality Date  . Fibroidectomy  2002    laparotomy for removal of fibroids  . Tonsillectomy and adenoidectomy    . Cesarean section  2004    x 1  . Myomectomy  2002  . Shoulder arthroscopy Left 04/30/2014    Procedure: Left Shoulder Arthroscopy and Debridement;  Surgeon: Nadara Mustard, MD;  Location: [facility] OR;  Service: Orthopedics;  Laterality: Left;  . Wisdom tooth extraction    . Upper gi endoscopy    . Colonoscopy    . Dilitation & currettage/hystroscopy with novasure ablation N/A 01/09/2015    Procedure: DILATATION & CURETTAGE/HYSTEROSCOPY WITH NOVASURE ABLATION;  Surgeon: Donovan Kail, MD;  Location: [facility];  Service: Gynecology;  Laterality: N/A;    There were no vitals filed for this visit.      Subjective Assessment - 01/11/16 0805    Subjective "the exercises are going fine, I've been feeling pain since I left here the other day"    Currently in Pain? Yes   Pain Score 5    Pain Location Hip   Pain Orientation Left   Pain Descriptors / Indicators Aching   Pain Type Chronic pain   Pain Radiating Towards lateral aspect of the L thigh to the knee   Pain Onset More than a month ago   Pain Frequency Intermittent   Aggravating Factors  prolonged sitting, walking   Pain Relieving Factors laying down or sitting on a pillow underneath the L LE            [facility] PT Assessment - 01/11/16 0001    ROM / Strength   AROM / PROM / Strength AROM;PROM   AROM   AROM Assessment Site Hip   Right/Left Hip Left;Right   Right Hip Extension 10   Right Hip Flexion 86   Right Hip External Rotation  18   Right Hip Internal Rotation  30   Left Hip Extension 10  pain with lowering   Left Hip Flexion 55  ERP   Left Hip External Rotation  15  mild soreness during motin   Left Hip Internal Rotation  25   PROM   PROM Assessment Site Hip   Right/Left Hip Left   Left  Hip Flexion 88  little bit of pain at end range   Left Hip External Rotation  20   Left Hip Internal Rotation  25   Standardized Balance Assessment   Standardized Balance Assessment Berg Balance Test   Berg Balance Test   Sit to Stand Able to stand without using hands and stabilize independently   Standing Unsupported Able to stand safely 2 minutes   Sitting with Back Unsupported but Feet Supported on Floor or Stool Able to sit safely and securely 2 minutes   Stand to Sit Sits safely with minimal use of hands   Transfers Able to transfer safely, minor use of hands   Standing Unsupported with Eyes Closed Able to stand 10 seconds safely   Standing Ubsupported with Feet Together Able to place feet together independently and stand for 1 minute with supervision   From Standing, Reach Forward with Outstretched Arm Can reach forward >12 cm safely (5")   From Standing Position, Pick up Object from Floor Able to pick up shoe, needs supervision   From Standing Position,  Turn to Look Behind Over each Shoulder Looks behind one side only/other side shows less weight shift   Turn 360 Degrees Able to turn 360 degrees safely one side only in 4 seconds or less   Standing Unsupported, Alternately Place Feet on Step/Stool Able to stand independently and safely and complete 8 steps in 20 seconds   Standing Unsupported, One Foot in Front Able to plae foot ahead of the other independently and hold 30 seconds   Standing on One Leg Able to lift leg independently and hold 5-10 seconds   Total Score 49                     [facility] Adult PT Treatment/Exercise - 01/11/16 0001    Lumbar Exercises: Seated   Other Seated Lumbar Exercises seated pelvic tilts 2 x 10  given as HEP   Knee/Hip Exercises: Stretches   ITB Stretch Left;2 reps;30 seconds  in R sidelying   Knee/Hip Exercises: Sidelying   Hip ABduction AROM;Strengthening;Left;2 sets;10 reps  in R sidelying, verbal cues for form   Manual Therapy   Manual therapy comments manual trigger point release in the glute medius x 3                 PT Education - 01/11/16 1012    Education provided Yes   Education Details benefits of manual trigger point release techniques and how they can be performed at home with a tennis ball, updated HEP with reps/sets with proper form and treatment rationale, posture education with sitting and pelvic tilts to help promote proper lumbar position.   Person(s) Educated Patient   Methods Explanation   Comprehension Verbalized understanding          PT Short Term Goals - 01/07/16 1013    PT SHORT TERM GOAL #1   Title Independent with initial HEP for hip, trunk.    Time 4   Period Weeks   Status New   PT SHORT TERM GOAL #2   Title Pain decreased 25% with work duties, including sitting for >15 min    Time 4   Status New   PT SHORT TERM GOAL #3   Title Pt will be able to move on mat table (rolll, transfer) without increased pain using good body mechanics    Time 4    Period Weeks   Status New   PT SHORT TERM GOAL #  4   Title Pt will be able to walk up stairs to her apt with no increase in pain, 25% less fatigue, dyspnea.    Time 4   Period Weeks   Status New   PT SHORT TERM GOAL #5   Title Pt will score less than 30% limited on FOTO    Time 4   Period Weeks   Status New                  Plan - 01/11/16 1017    Clinical Impression Statement Mrs. Theall reported pain remained the same since the last session and had been consistent with her HEP. updated HEP to include hip strengthening and core strengthening in supine/ sitting. following todays session she reported no changes in pain but stated it was tough to tell because it got worse wile she was at work. she was able to complete the given exercises without complaints of pain. pt declined modalities post session.    PT Next Visit Plan core/ hip strengthening, stretching, manual to calm down hip tightness    PT Home Exercise Plan supine hip marching, seated pelvic tilt, sidelying hip abduction   Consulted and Agree with Plan of Care Patient      Patient will benefit from skilled therapeutic intervention in order to improve the following deficits and impairments:  Increased fascial restricitons, Obesity, Pain, Impaired flexibility, Impaired sensation, Decreased strength, Decreased mobility, Improper body mechanics, Decreased balance  Visit Diagnosis: Pain in left hip  Muscle weakness (generalized)  Pain in joint, shoulder region, left  Weakness  Stiffness of joint, shoulder region, left     Problem List Patient Active Problem List   Diagnosis Date Noted  . Frequency of urination 12/15/2015  . Muscle cramps 10/12/2015  . Wheezing 08/04/2015  . Cough 08/04/2015  . Well adult exam 05/12/2015  . Dyslipidemia 10/03/2014  . Allergic rhinitis 04/12/2014  . Mild persistent asthma 04/12/2014  . Dyspnea 04/12/2014  . Left shoulder pain 02/07/2014  . Hyperhydrosis disorder  03/30/2012  . Intertrigo 03/30/2012  . OSA on CPAP 03/30/2012  . GERD (gastroesophageal reflux disease) 04/12/2011  . Abdominal pain, epigastric 05/04/2010  . Unspecified eustachian tube disorder 02/23/2010  . OTITIS MEDIA, LEFT 02/23/2010  . INTERMITTENT VERTIGO 02/23/2010  . NAUSEA 01/18/2010  . LUQ PAIN 01/18/2010  . 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 CHEST PAIN 10/05/2009  . DEPRESSION 09/07/2009  . Obesity 07/17/2009  . Diabetes type 2, uncontrolled (HCC) 04/14/2009  . 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 ELEVATED BP 10/02/2008  . B12 deficiency 08/30/2007  . Vitamin D deficiency 08/30/2007  . HAIR LOSS 08/30/2007  . PARESTHESIA 08/30/2007  . WEIGHT GAIN 08/30/2007  . [facility] DEFICIENCY 05/28/2007   Julia Estrada PT, DPT, LAT, ATC  01/11/2016  10:20 AM         [facility] [address] [place], [place], [address] Phone: [phone]   Fax:  [phone]  Name: Julia Estrada MRN: [id number] Date of Birth: 1971-03-19

## 2016-01-12 ENCOUNTER — Other Ambulatory Visit: Payer: Self-pay | Admitting: Endocrinology

## 2016-01-13 ENCOUNTER — Ambulatory Visit: Payer: 59 | Admitting: Physical Therapy

## 2016-01-13 DIAGNOSIS — M25552 Pain in left hip: Secondary | ICD-10-CM | POA: Diagnosis not present

## 2016-01-13 DIAGNOSIS — M6281 Muscle weakness (generalized): Secondary | ICD-10-CM

## 2016-01-13 NOTE — Patient Instructions (Signed)

## 2016-01-13 NOTE — Therapy (Signed)
[place], [place], [address] Phone: [phone]   Fax:  [phone]  Physical Therapy Treatment  Patient Details  Name: Julia Estrada MRN: [id number] Date of Birth: 08/23/1970 Referring Provider: Dr. Sharol Given   Encounter Date: 01/13/2016      PT End of Session - 01/13/16 1513    Visit Number 3   Number of Visits 16   Date for PT Re-Evaluation 03/02/16   PT Start Time 1505   PT Stop Time 1553   PT Time Calculation (min) 48 min   Activity Tolerance Patient tolerated treatment well   Behavior During Therapy [facility] for tasks assessed/performed      Past Medical History  Diagnosis Date  . Anemia     iron deficiency  . Obesity     BMI=44  . Vitamin B12 deficiency   . Vitamin D deficiency   . Depression 2011  . Diabetes mellitus 2010    type II  . GERD (gastroesophageal reflux disease) 2004  . Hypertension 2008  . Hyperlipidemia   . Shortness of breath     with exercise  . Anxiety   . Sleep apnea     uses CPAP every night    Past Surgical History  Procedure Laterality Date  . Fibroidectomy  2002    laparotomy for removal of fibroids  . Tonsillectomy and adenoidectomy    . Cesarean section  2004    x 1  . Myomectomy  2002  . Shoulder arthroscopy Left 04/30/2014    Procedure: Left Shoulder Arthroscopy and Debridement;  Surgeon: Newt Minion, MD;  Location: [place];  Service: Orthopedics;  Laterality: Left;  . Wisdom tooth extraction    . Upper gi endoscopy    . Colonoscopy    . Dilitation & currettage/hystroscopy with novasure ablation N/A 01/09/2015    Procedure: DILATATION & CURETTAGE/HYSTEROSCOPY WITH NOVASURE ABLATION;  Surgeon: Harle Battiest, MD;  Location: [facility];  Service: Gynecology;  Laterality: N/A;    There were no vitals filed for this visit.      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  Pain was 5/10 when sitting at  work, none really in standing.    Currently in Pain? Yes   Pain Score 5    Pain Location Hip   Pain Orientation Left   Pain Descriptors / Indicators Aching   Pain Type Chronic pain   Pain Radiating Towards L thigh    Pain Onset More than a month ago   Pain Frequency Intermittent   Aggravating Factors  sitting too long    Pain Relieving Factors position changes             [facility] Adult PT Treatment/Exercise - 01/13/16 1523    Self-Care   Other Self-Care Comments  HEP   Lumbar Exercises: Aerobic   Stationary Bike NuStep LE and UE 6 min    Lumbar Exercises: Supine   Ab Set 10 reps   Clam 10 reps   Bent Knee Raise 10 reps   Bridge 10 reps   Lumbar Exercises: Prone   Other Prone Lumbar Exercises abdominal draw in x 10    Other Prone Lumbar Exercises knee flexion x 10 with abs in    Knee/Hip Exercises: Stretches   Active Hamstring Stretch 2 reps;30 seconds   Hip Flexor Stretch Left;2 reps;30 seconds   ITB Stretch Left;2 reps;30 seconds   Cryotherapy  Number Minutes Cryotherapy 10 Minutes   Cryotherapy Location Hip   Type of Cryotherapy Ice pack   Manual Therapy   Manual Therapy Passive ROM   Passive ROM L hip PROM, glute med compression, lmod pressure, brief.                PT Education - 01/13/16 1533    Education provided Yes   Education Details stabilization concepts and prepilates HEP    Person(s) Educated Patient   Methods Explanation;Demonstration;Tactile cues;Handout;Verbal cues   Comprehension Verbalized understanding;Tactile cues required;Need further instruction;Verbal cues required          PT Short Term Goals - 01/13/16 1514    PT SHORT TERM GOAL #1   Title Independent with initial HEP for hip, trunk.   STG = LTG    Status On-going   PT SHORT TERM GOAL #2   Title Pain decreased 25% with work duties, including sitting for >15 min STG=LTG   Status On-going   PT SHORT TERM GOAL #3   Title Pt will be able to move on mat table (rolll, transfer)  without increased pain using good body mechanics STG=LTG   Status On-going   PT SHORT TERM GOAL #4   Title Pt will be able to walk up stairs to her apt with no increase in pain, 25% less fatigue, dyspnea. STG=LTG   Status On-going   PT SHORT TERM GOAL #5   Title Pt will score less than 30% limited on FOTO STG=LTG    Status On-going                  Plan - 01/13/16 1534    Clinical Impression Statement Julia Estrada tolerated mat stab ex well, still needs cues to avoid hyperextension of lumbar spine with LE movements.  No goals met, still complains of severe pain in L hip esp standing still long periods and sitting over 15 min.     PT Next Visit Plan core/ hip strengthening, stretching, manual to calm down hip tightness    PT Home Exercise Plan supine hip marching, seated pelvic tilt, sidelying hip abduction, prepilates, stretching    Consulted and Agree with Plan of Care Patient      Patient will benefit from skilled therapeutic intervention in order to improve the following deficits and impairments:  Increased fascial restricitons, Obesity, Pain, Impaired flexibility, Impaired sensation, Decreased strength, Decreased mobility, Improper body mechanics, Decreased balance  Visit Diagnosis: Pain in left hip  Muscle weakness (generalized)     Problem List Patient Active Problem List   Diagnosis Date Noted  . Frequency of urination 12/15/2015  . Muscle cramps 10/12/2015  . Wheezing 08/04/2015  . Cough 08/04/2015  . Well adult exam 05/12/2015  . Dyslipidemia 10/03/2014  . Allergic rhinitis 04/12/2014  . Mild persistent asthma 04/12/2014  . Dyspnea 04/12/2014  . Left shoulder pain 02/07/2014  . 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 Intertrigo 03/30/2012  . OSA on CPAP 03/30/2012  . GERD (gastroesophageal reflux disease) 04/12/2011  . Abdominal pain, epigastric 05/04/2010  . Unspecified eustachian tube disorder 02/23/2010  . OTITIS MEDIA, LEFT 02/23/2010  . INTERMITTENT  VERTIGO 02/23/2010  . NAUSEA 01/18/2010  . LUQ PAIN 01/18/2010  . 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 CHEST PAIN 10/05/2009  . DEPRESSION 09/07/2009  . Obesity 07/17/2009  . Diabetes type 2, uncontrolled ([place]) 04/14/2009  . 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 ELEVATED BP 10/02/2008  . B12 deficiency 08/30/2007  . Vitamin D deficiency 08/30/2007  . HAIR LOSS 08/30/2007  .  PARESTHESIA 08/30/2007  . WEIGHT GAIN 08/30/2007  . [facility] DEFICIENCY 05/28/2007    PAA,JENNIFER 01/13/2016, 3:46 PM  [place] [facility] [address] [place], [place], [address] Phone: [phone]   Fax:  [phone]  Name: Julia Estrada MRN: [id number] Date of Birth: 1970-11-13    Raeford Razor, PT 01/13/2016 3:46 PM Phone: [phone] Fax: [phone]

## 2016-01-22 ENCOUNTER — Ambulatory Visit: Payer: 59 | Admitting: Physical Therapy

## 2016-01-26 ENCOUNTER — Ambulatory Visit: Payer: 59 | Admitting: Physical Therapy

## 2016-01-26 DIAGNOSIS — M25612 Stiffness of left shoulder, not elsewhere classified: Secondary | ICD-10-CM

## 2016-01-26 DIAGNOSIS — M25552 Pain in left hip: Secondary | ICD-10-CM | POA: Diagnosis not present

## 2016-01-26 DIAGNOSIS — M6281 Muscle weakness (generalized): Secondary | ICD-10-CM

## 2016-01-26 DIAGNOSIS — R531 Weakness: Secondary | ICD-10-CM

## 2016-01-26 DIAGNOSIS — M25512 Pain in left shoulder: Secondary | ICD-10-CM

## 2016-01-26 NOTE — Therapy (Signed)
[facility] Outpatient Rehabilitation [facility] [address] [place], [place], [address] Phone: [phone]   Fax:  [phone]  Physical Therapy Treatment  Patient Details  Name: Julia Estrada MRN: [id number] Date of Birth: [birth year] Referring Provider: Dr. Lajoyce Corners   Encounter Date: 01/26/2016      PT End of Session - 01/26/16 1250    Visit Number 4   Number of Visits 16   Date for PT Re-Evaluation 03/02/16   PT Start Time 1149   PT Stop Time 1250   PT Time Calculation (min) 61 min   Activity Tolerance Patient tolerated treatment well   Behavior During Therapy [facility] for tasks assessed/performed      Past Medical History  Diagnosis Date  . Anemia     iron deficiency  . Obesity     BMI=44  . Vitamin B12 deficiency   . Vitamin D deficiency   . Depression 2011  . Diabetes mellitus 2010    type II  . GERD (gastroesophageal reflux disease) 2004  . Hypertension 2008  . Hyperlipidemia   . Shortness of breath     with exercise  . Anxiety   . Sleep apnea     uses CPAP every night    Past Surgical History  Procedure Laterality Date  . Fibroidectomy  2002    laparotomy for removal of fibroids  . Tonsillectomy and adenoidectomy    . Cesarean section  2004    x 1  . Myomectomy  2002  . Shoulder arthroscopy Left 04/30/2014    Procedure: Left Shoulder Arthroscopy and Debridement;  Surgeon: Nadara Mustard, MD;  Location: [facility] OR;  Service: Orthopedics;  Laterality: Left;  . Wisdom tooth extraction    . Upper gi endoscopy    . Colonoscopy    . Dilitation & currettage/hystroscopy with novasure ablation N/A 01/09/2015    Procedure: DILATATION & CURETTAGE/HYSTEROSCOPY WITH NOVASURE ABLATION;  Surgeon: Donovan Kail, MD;  Location: [facility];  Service: Gynecology;  Laterality: N/A;    There were no vitals filed for this visit.      Subjective Assessment - 01/26/16 1201    Subjective [place].   Hip pain worse.  Was walking a lot .  Lost 3 pounds and  steps.      Currently in Pain? Yes   Pain Score 8    Pain Location Hip   Pain Orientation Left   Pain Descriptors / Indicators Aching   Pain Radiating Towards back and thigh   Pain Frequency Constant   Aggravating Factors  a lot of walking   Pain Relieving Factors nothing,  ibuprophen                         [facility] Adult PT Treatment/Exercise - 01/26/16 0001    Self-Care   Other Self-Care Comments  issued tennis balls and demonstrated soft tissue techniques   Lumbar Exercises: Stretches   Lower Trunk Rotation --  10 x  legs on ball   Active Hamstring Stretch 3 reps;30 seconds   ITB Stretch Left;2 reps;30 seconds   Lumbar Exercises: Supine   Ab Set 10 reps   Clam 10 reps   Bent Knee Raise 10 reps   Bridge Limitations hurts too much to do  8/10   Moist Heat Therapy   Number Minutes Moist Heat 10 Minutes   Moist Heat Location --  hip  Left,  sidelying   Manual Therapy   Manual  Therapy Soft tissue mobilization   Manual therapy comments soft tissue,  trigger point release.  Left hip   Soft tissue mobilization tissue softened.                  PT Education - 01/26/16 1250    Education provided Yes   Education Details trigger point release   Person(s) Educated Patient   Methods Explanation;Demonstration   Comprehension Verbalized understanding          PT Short Term Goals - 01/26/16 1253    PT SHORT TERM GOAL #1   Title Independent with initial HEP for hip, trunk.   STG = LTG    Baseline did not do on vacation.  Able to do in clinic   Time 4   Period Weeks   Status On-going   PT SHORT TERM GOAL #2   Title Pain decreased 25% with work duties, including sitting for >15 min STG=LTG   Baseline pain flare today   Time 4   Period Weeks   Status On-going   PT SHORT TERM GOAL #3   Title Pt will be able to move on mat table (rolll, transfer) without increased pain using good body mechanics STG=LTG   Time 4   Period Weeks   Status On-going   PT SHORT  TERM GOAL #4   Title Pt will be able to walk up stairs to her apt with no increase in pain, 25% less fatigue, dyspnea. STG=LTG   Time 4   Period Weeks   Status Unable to assess   PT SHORT TERM GOAL #5   Title Pt will score less than 30% limited on FOTO STG=LTG    Time 4   Period Weeks   Status Unable to assess                  Plan - 01/26/16 1252    Clinical Impression Statement Pain decreased to 5/10 after session.  [place]  She was very busy on vacation and plans to resume her home exercises now that she has returned.    PT Next Visit Plan core/ hip strengthening, stretching, manual to calm down hip tightness    PT Home Exercise Plan continue   Consulted and Agree with Plan of Care Patient      Patient will benefit from skilled therapeutic intervention in order to improve the following deficits and impairments:  Increased fascial restricitons, Obesity, Pain, Impaired flexibility, Impaired sensation, Decreased strength, Decreased mobility, Improper body mechanics, Decreased balance  Visit Diagnosis: Pain in left hip  Muscle weakness (generalized)  Pain in joint, shoulder region, left  Weakness  Stiffness of joint, shoulder region, left     Problem List Patient Active Problem List   Diagnosis Date Noted  . Frequency of urination 12/15/2015  . Muscle cramps 10/12/2015  . Wheezing 08/04/2015  . Cough 08/04/2015  . Well adult exam 05/12/2015  . Dyslipidemia 10/03/2014  . Allergic rhinitis 04/12/2014  . Mild persistent asthma 04/12/2014  . Dyspnea 04/12/2014  . Left shoulder pain 02/07/2014  . 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 Intertrigo 03/30/2012  . OSA on CPAP 03/30/2012  . GERD (gastroesophageal reflux disease) 04/12/2011  . Abdominal pain, epigastric 05/04/2010  . Unspecified eustachian tube disorder 02/23/2010  . OTITIS MEDIA, LEFT 02/23/2010  . INTERMITTENT VERTIGO 02/23/2010  . NAUSEA 01/18/2010  . LUQ PAIN  01/18/2010  . 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 CHEST PAIN 10/05/2009  . DEPRESSION 09/07/2009  .  Obesity 07/17/2009  . Diabetes type 2, uncontrolled (HCC) 04/14/2009  . 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 ELEVATED BP 10/02/2008  . B12 deficiency 08/30/2007  . Vitamin D deficiency 08/30/2007  . HAIR LOSS 08/30/2007  . PARESTHESIA 08/30/2007  . WEIGHT GAIN 08/30/2007  . [facility]IC[facility] 05/28/2007    HARRIS,KAREN 01/26/2016, 12:55 PM  [facility] [address] [place], [place], [address] Phone: [phone]   Fax:  [phone]  Name: Shauna Hughanya B Para MRN: [id number] Date of Birth: 05/07/1971    Liz BeachKaren Harris, PTA 01/26/2016 12:55 PM Phone: [phone] Fax: [phone]

## 2016-02-01 ENCOUNTER — Other Ambulatory Visit (INDEPENDENT_AMBULATORY_CARE_PROVIDER_SITE_OTHER): Payer: 59

## 2016-02-01 DIAGNOSIS — E1165 Type 2 diabetes mellitus with hyperglycemia: Secondary | ICD-10-CM | POA: Diagnosis not present

## 2016-02-01 LAB — MICROALBUMIN / CREATININE URINE RATIO
Creatinine,U: 35.3 mg/dL
MICROALB/CREAT RATIO: 2 mg/g (ref 0.0–30.0)
Microalb, Ur: <0.7 mg/dL (ref 0.0–1.9)

## 2016-02-01 LAB — COMPREHENSIVE METABOLIC PANEL
ALBUMIN: 3.5 g/dL (ref 3.5–5.2)
ALK PHOS: 104 U/L (ref 39–117)
ALT: 11 U/L (ref 0–35)
AST: 8 U/L (ref 0–37)
BILIRUBIN TOTAL: 0.4 mg/dL (ref 0.2–1.2)
BUN: 11 mg/dL (ref 6–23)
CO2: 25 mEq/L (ref 19–32)
Calcium: 9.6 mg/dL (ref 8.4–10.5)
Chloride: 101 mEq/L (ref 96–112)
Creatinine, Ser: 0.68 mg/dL (ref 0.40–1.20)
GFR: 120.43 mL/min (ref >60.00)
GLUCOSE: 103 mg/dL — AB (ref 70–99)
Potassium: 4.1 mEq/L (ref 3.5–5.1)
Sodium: 134 mEq/L — ABNORMAL LOW (ref 135–145)
TOTAL PROTEIN: 7.1 g/dL (ref 6.0–8.3)

## 2016-02-01 LAB — HEMOGLOBIN A1C: HEMOGLOBIN A1C: 7.9 % — AB (ref 4.6–6.5)

## 2016-02-03 ENCOUNTER — Ambulatory Visit: Payer: 59 | Attending: Orthopedic Surgery | Admitting: Physical Therapy

## 2016-02-03 DIAGNOSIS — M25512 Pain in left shoulder: Secondary | ICD-10-CM | POA: Diagnosis present

## 2016-02-03 DIAGNOSIS — R531 Weakness: Secondary | ICD-10-CM | POA: Insufficient documentation

## 2016-02-03 DIAGNOSIS — M6281 Muscle weakness (generalized): Secondary | ICD-10-CM | POA: Insufficient documentation

## 2016-02-03 DIAGNOSIS — M25612 Stiffness of left shoulder, not elsewhere classified: Secondary | ICD-10-CM | POA: Diagnosis present

## 2016-02-03 DIAGNOSIS — M25552 Pain in left hip: Secondary | ICD-10-CM | POA: Diagnosis present

## 2016-02-03 NOTE — Therapy (Signed)
[place], [place], [address] Phone: [phone]   Fax:  [phone]  Physical Therapy Treatment  Patient Details  Name: Julia Estrada MRN: [id number] Date of Birth: 12-07-1970 Referring Provider: Dr. Sharol Given   Encounter Date: 02/03/2016      PT End of Session - 02/03/16 1157    Visit Number 5   Number of Visits 16   Date for PT Re-Evaluation 03/02/16   PT Start Time 1152   PT Stop Time 1231   PT Time Calculation (min) 39 min   Activity Tolerance Patient tolerated treatment well   Behavior During Therapy [facility] for tasks assessed/performed      Past Medical History  Diagnosis Date  . Anemia     iron deficiency  . Obesity     BMI=44  . Vitamin B12 deficiency   . Vitamin D deficiency   . Depression 2011  . Diabetes mellitus 2010    type II  . GERD (gastroesophageal reflux disease) 2004  . Hypertension 2008  . Hyperlipidemia   . Shortness of breath     with exercise  . Anxiety   . Sleep apnea     uses CPAP every night    Past Surgical History  Procedure Laterality Date  . Fibroidectomy  2002    laparotomy for removal of fibroids  . Tonsillectomy and adenoidectomy    . Cesarean section  2004    x 1  . Myomectomy  2002  . Shoulder arthroscopy Left 04/30/2014    Procedure: Left Shoulder Arthroscopy and Debridement;  Surgeon: Newt Minion, MD;  Location: [place];  Service: Orthopedics;  Laterality: Left;  . Wisdom tooth extraction    . Upper gi endoscopy    . Colonoscopy    . Dilitation & currettage/hystroscopy with novasure ablation N/A 01/09/2015    Procedure: DILATATION & CURETTAGE/HYSTEROSCOPY WITH NOVASURE ABLATION;  Surgeon: Harle Battiest, MD;  Location: [facility];  Service: Gynecology;  Laterality: N/A;    There were no vitals filed for this visit.      Subjective Assessment - 02/03/16 1154    Subjective Pain is a little better.  Pain is mostly when I sit.     Currently in Pain? Yes   Pain  Score 5    Pain Location Hip   Pain Orientation Left   Pain Descriptors / Indicators Aching;Tightness   Pain Type Chronic pain   Pain Onset More than a month ago   Pain Frequency Intermittent   Aggravating Factors  sitting    Pain Relieving Factors medicine, ice sometimes                          [facility] Adult PT Treatment/Exercise - 02/03/16 1212    Lumbar Exercises: Stretches   Active Hamstring Stretch 3 reps;30 seconds   Active Hamstring Stretch Limitations LLE   Quadruped Mid Back Stretch 3 reps;30 seconds   Piriformis Stretch 3 reps;30 seconds   Piriformis Stretch Limitations HEP   Lumbar Exercises: Machines for Strengthening   Leg Press 1 plate parallel x 15 reps, 1 plate x 15 with hip ER, emphasized core prior to hip and knee ext    Lumbar Exercises: Standing   Wall Slides 10 reps   Other Standing Lumbar Exercises standing hip hinge 1 leg for core stab used countertop for balance    Lumbar Exercises: Supine   Ab Set 10 reps   Clam 10 reps  Bent Knee Raise 10 reps                PT Education - 02/03/16 1244    Education provided Yes   Education Details piriformis   Person(s) Educated Patient   Methods Explanation   Comprehension Verbalized understanding          PT Short Term Goals - 02/03/16 1203    PT SHORT TERM GOAL #1   Title Independent with initial HEP for hip, trunk.   STG = LTG    Status Achieved   PT SHORT TERM GOAL #2   Title Pain decreased 25% with work duties, including sitting for >15 min STG=LTG   Baseline depends on the day    Status Partially Met   PT SHORT TERM GOAL #3   Title Pt will be able to move on mat table (rolll, transfer) without increased pain using good body mechanics STG=LTG   Status Achieved   PT SHORT TERM GOAL #4   Title Pt will be able to walk up stairs to her apt with no increase in pain, 25% less fatigue, dyspnea. STG=LTG   Status On-going   PT SHORT TERM GOAL #5   Title Pt will score less than  30% limited on FOTO STG=LTG    Status On-going                  Plan - 02/03/16 1246    Clinical Impression Statement Last session really helped her L hip pain.  She continues to do sedentary work, but also works out with a Clinical research associate, doing much more challenging exercises.  Pain did not limit her today.  Goals are in progress and and variable, depending on the day.  Able to localize paing to L piriformis muscle.    PT Next Visit Plan piriformis stretching, core strength, manual if needed.    PT Home Exercise Plan hip abd, hamstring stretch, ab set with march, seated pelvic tilt, prepilates for stab. , seated piriformis   Consulted and Agree with Plan of Care Patient      Patient will benefit from skilled therapeutic intervention in order to improve the following deficits and impairments:  Increased fascial restricitons, Obesity, Pain, Impaired flexibility, Impaired sensation, Decreased strength, Decreased mobility, Improper body mechanics, Decreased balance  Visit Diagnosis: Pain in left hip  Muscle weakness (generalized)  Pain in joint, shoulder region, left  Weakness  Stiffness of joint, shoulder region, left     Problem List Patient Active Problem List   Diagnosis Date Noted  . Frequency of urination 12/15/2015  . Muscle cramps 10/12/2015  . Wheezing 08/04/2015  . Cough 08/04/2015  . Well adult exam 05/12/2015  . Dyslipidemia 10/03/2014  . Allergic rhinitis 04/12/2014  . Mild persistent asthma 04/12/2014  . Dyspnea 04/12/2014  . Left shoulder pain 02/07/2014  . 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 Intertrigo 03/30/2012  . OSA on CPAP 03/30/2012  . GERD (gastroesophageal reflux disease) 04/12/2011  . Abdominal pain, epigastric 05/04/2010  . Unspecified eustachian tube disorder 02/23/2010  . OTITIS MEDIA, LEFT 02/23/2010  . INTERMITTENT VERTIGO 02/23/2010  . NAUSEA 01/18/2010  . LUQ PAIN 01/18/2010  . 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 CHEST PAIN  10/05/2009  . DEPRESSION 09/07/2009  . Obesity 07/17/2009  . Diabetes type 2, uncontrolled ([place]) 04/14/2009  . 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 ELEVATED BP 10/02/2008  . B12 deficiency 08/30/2007  . Vitamin D deficiency 08/30/2007  . HAIR LOSS 08/30/2007  . PARESTHESIA 08/30/2007  . WEIGHT GAIN  08/30/2007  . [facility] DEFICIENCY 05/28/2007    Jahira Swiss 02/03/2016, 12:54 PM  [facility] [address] [place], [place], [address] Phone: [phone]   Fax:  [phone]  Name: Julia Estrada MRN: [id number] Date of Birth: 02/25/1971    Raeford Razor, PT 02/03/2016 12:54 PM Phone: [phone] Fax: [phone]

## 2016-02-04 ENCOUNTER — Ambulatory Visit (INDEPENDENT_AMBULATORY_CARE_PROVIDER_SITE_OTHER): Payer: 59 | Admitting: Endocrinology

## 2016-02-04 ENCOUNTER — Encounter: Payer: Self-pay | Admitting: Endocrinology

## 2016-02-04 VITALS — BP 122/74 | HR 87 | Ht 64.0 in | Wt 279.0 lb

## 2016-02-04 DIAGNOSIS — E1165 Type 2 diabetes mellitus with hyperglycemia: Secondary | ICD-10-CM | POA: Diagnosis not present

## 2016-02-04 NOTE — Progress Notes (Signed)
Patient ID: Julia Estrada, female   DOB: 1970-09-21, 45 y.o.   MRN: [id number]           Reason for Appointment: Follow-up for Type 2 Diabetes  History of Present Illness:          Diagnosis: Type 2 diabetes mellitus, date of diagnosis: ?  2011        Past history:  Not clear when her diagnosis was first made but she probably did not have very high sugars. She was initially treated with metformin which was titrated to 2000 mg a day without side effects She was also sent to the diabetes classes at the time of diagnosis At some point she was also given Januvia which has been continued She was also tried on Victoza in 08/2012 but she could not tolerate it for more than a week because of significant nausea and vomiting.  She thinks she may have taken only the 0.6 mg dose Since in 2015 her A1c had been over 8% she was started on Jardiance  Recent history:   Non-insulin hypoglycemic drugs the patient is taking are: Tanzeum 50 mg weekly, Jardiance 25 mg, metformin ER 2 g      Since her A1c had been consistently over 7% she was started on Tanzeum on her visit in 9/16 She has taken 50 mg weekly without any nausea which she had with Victoza She was also told to increase her Jardiance up to 25 mg   Her A1c is still high at 7.9, previously 8%  Current blood sugar patterns and problems identified:  She is  on a study program with a glucose meter that cannot be downloaded, blood sugars reviewed from her smart phone  Although her blood sugars are looking fairly good in the morning usually she does have occasional readings in the 170-200 range after meals periodically  However blood sugars are not consistently out of range  She has tolerated Jardiance 25 mg and may be gradually losing a couple pounds with this  She thinks she has been trying to watch her diet a little bit more last 4-6 weeks  She is also trying to start exercise       Side effects from medications have been: Vomiting with  Victoza which she tried for one week only     Compliance with the medical regimen: Fair  Hypoglycemia:   none  Glucose monitoring: About once a day or less   Blood Glucose readings   Recent range 88-211 Am 86-139 pcb 97-195 [id number]     Self-care: The diet that the patient has been following is: none, does not always avoid sweets, may not always control high-fat foods Meals: 3 meals per day. Breakfast is oatmeal or biscuit,  lunch is chicken or pork chop  Has fruit cups chips or popcorn for snacks         Exercise: Has been walking regularly or exercise class 3/7 days a week Dietician visit, most recent: 1/16              Weight history: Previous range 276 -309  Wt Readings from Last 3 Encounters:  02/04/16 279 lb (126.554 kg)  12/15/15 280 lb (127.007 kg)  11/02/15 281 lb (127.461 kg)    Glycemic control:   Lab Results  Component Value Date   HGBA1C 7.9* 02/01/2016   HGBA1C 8.5* 12/15/2015   HGBA1C 8.0* 10/29/2015   Lab Results  Component Value Date   MICROALBUR <0.7 02/01/2016   LDLCALC 104*  05/05/2015   CREATININE 0.68 02/01/2016    Lab on 02/01/2016  Component Date Value Ref Range Status  . Hgb A1c MFr Bld 02/01/2016 7.9* 4.6 - 6.5 % Final   Glycemic Control Guidelines for People with Diabetes:Non Diabetic:  <6%Goal of Therapy: <7%Additional Action Suggested:  >8%   . Sodium 02/01/2016 134* 135 - 145 mEq/L Final  . Potassium 02/01/2016 4.1  3.5 - 5.1 mEq/L Final  . Chloride 02/01/2016 101  96 - 112 mEq/L Final  . CO2 02/01/2016 25  19 - 32 mEq/L Final  . Glucose, Bld 02/01/2016 103* 70 - 99 mg/dL Final  . BUN 29/56/213007/09/2015 11  6 - 23 mg/dL Final  . Creatinine, Ser 02/01/2016 0.68  0.40 - 1.20 mg/dL Final  . Total Bilirubin 02/01/2016 0.4  0.2 - 1.2 mg/dL Final  . Alkaline Phosphatase 02/01/2016 104  39 - 117 U/L Final  . AST 02/01/2016 8  0 - 37 U/L Final  . ALT 02/01/2016 11  0 - 35 U/L Final  . Total Protein 02/01/2016 7.1  6.0 - 8.3 g/dL Final  .  Albumin 86/57/846907/09/2015 3.5  3.5 - 5.2 g/dL Final  . Calcium 62/95/284107/09/2015 9.6  8.4 - 10.5 mg/dL Final  . GFR 32/44/010207/09/2015 120.43  >60.00 mL/min Final  . Microalb, Ur 02/01/2016 <0.7  0.0 - 1.9 mg/dL Final  . Creatinine,U 72/53/664407/09/2015 35.3   Final  . Microalb Creat Ratio 02/01/2016 2.0  0.0 - 30.0 mg/g Final        Medication List       This list is accurate as of: 02/04/16  8:44 AM.  Always use your most recent med list.               Albiglutide 50 MG Pen  Commonly known as:  TANZEUM  Inject the contents of one pen once per week     albuterol 108 (90 Base) MCG/ACT inhaler  Commonly known as:  PROVENTIL HFA;VENTOLIN HFA  Inhale 2 puffs into the lungs every 6 (six) hours as needed for wheezing or shortness of breath.     azelastine 0.1 % nasal spray  Commonly known as:  ASTELIN  Place 1 spray into both nostrils 2 (two) times daily. Use in each nostril as directed     cholecalciferol 1000 units tablet  Commonly known as:  VITAMIN D  Take 1 tablet (1,000 Units total) by mouth daily.     diphenhydrAMINE 25 MG tablet  Commonly known as:  BENADRYL  Take 1 tablet (25 mg total) by mouth every 6 (six) hours as needed for itching or allergies (hives).     ferrous sulfate 325 (65 FE) MG tablet  Take 325 mg by mouth daily with breakfast.     fexofenadine 180 MG tablet  Commonly known as:  ALLEGRA  Take 180 mg by mouth daily as needed for allergies.     fluticasone 50 MCG/ACT nasal spray  Commonly known as:  FLONASE  Place 2 sprays into both nostrils daily.     glucose blood test strip  Commonly known as:  ONETOUCH VERIO  Use as instructed to check blood sugar twice a day dx code E11.65     ibuprofen 800 MG tablet  Commonly known as:  ADVIL,MOTRIN  Take 800 mg by mouth every 8 (eight) hours as needed for fever, headache or mild pain.     JARDIANCE 10 MG Tabs tablet  Generic drug:  empagliflozin  Take 25 mg by mouth daily.  metFORMIN 500 MG 24 hr tablet  Commonly known as:   GLUCOPHAGE-XR  Take 4 tablets by mouth  daily     montelukast 10 MG tablet  Commonly known as:  SINGULAIR  Take 1 tablet (10 mg total) by mouth at bedtime.     ONETOUCH DELICA LANCETS FINE Misc  Use 2 per day dx code E11.65     pantoprazole 40 MG tablet  Commonly known as:  PROTONIX  Take 1 tablet by mouth  daily     PREVIFEM 0.25-35 MG-MCG tablet  Generic drug:  norgestimate-ethinyl estradiol  Take 1 tablet by mouth daily.     ranitidine 300 MG tablet  Commonly known as:  ZANTAC  Take 1 tablet by mouth at  bedtime     simvastatin 20 MG tablet  Commonly known as:  ZOCOR  Take 1 tablet by mouth at  bedtime     triamcinolone cream 0.5 %  Commonly known as:  KENALOG  Apply 1 application topically 3 (three) times daily. On rash     valACYclovir 1000 MG tablet  Commonly known as:  VALTREX  Take 1,000 mg by mouth daily.        Allergies:  Allergies  Allergen Reactions  . Codeine Sulfate Nausea And Vomiting  . Hydrochlorothiazide W-Triamterene     REACTION: cramping  . Oxycodone-Acetaminophen Nausea And Vomiting  . Prednisone Hives  . Victoza [Liraglutide]     n/v    Past Medical History  Diagnosis Date  . Anemia     iron deficiency  . Obesity     BMI=44  . Vitamin B12 deficiency   . Vitamin D deficiency   . Depression 2011  . Diabetes mellitus 2010    type II  . GERD (gastroesophageal reflux disease) 2004  . Hypertension 2008  . Hyperlipidemia   . Shortness of breath     with exercise  . Anxiety   . Sleep apnea     uses CPAP every night    Past Surgical History  Procedure Laterality Date  . Fibroidectomy  2002    laparotomy for removal of fibroids  . Tonsillectomy and adenoidectomy    . Cesarean section  2004    x 1  . Myomectomy  2002  . Shoulder arthroscopy Left 04/30/2014    Procedure: Left Shoulder Arthroscopy and Debridement;  Surgeon: Nadara MustardMarcus Duda V, MD;  [facility];  Service: Orthopedics;  Laterality: Left;  . Wisdom tooth extraction     . Upper gi endoscopy    . Colonoscopy    . Dilitation & currettage/hystroscopy with novasure ablation N/A 01/09/2015    Procedure: DILATATION & CURETTAGE/HYSTEROSCOPY WITH NOVASURE ABLATION;  Surgeon: Donovan KailAllen Ross, MD;  Location: [facility];  Service: Gynecology;  Laterality: N/A;    Family History  Problem Relation Age of Onset  . Hypertension Mother     father, sisters  . Diabetes Mother     sister  . Kidney cancer Mother   . Cancer Mother   . Cholelithiasis Sister   . Allergic rhinitis Sister   . Clotting disorder Daughter   . Urticaria Daughter   . Heart disease Maternal Uncle   . Heart disease Maternal Grandfather     Social History:  reports that she has never smoked. She has never used smokeless tobacco. She reports that she does not drink alcohol or use illicit drugs.    Review of Systems        Lipids:  Her LDL is treated by  PCP with  Zocor She does have a family history of CAD      Lab Results  Component Value Date   CHOL 166 05/05/2015   HDL 40.40 05/05/2015   LDLCALC 104* 05/05/2015   LDLDIRECT 72.0 09/24/2014   TRIG 107.0 05/05/2015   CHOLHDL 4 05/05/2015                  The blood pressure has been Controlled even with stopping her Aldactone which apparently was causing blood cramps and frequent urination   BP Readings from Last 3 Encounters:  02/04/16 122/74  12/15/15 118/60  11/02/15 116/80    Urine microalbumin has been normal As also renal function and potassium    Lab Results  Component Value Date   CREATININE 0.68 02/01/2016   BUN 11 02/01/2016   NA 134* 02/01/2016   K 4.1 02/01/2016   CL 101 02/01/2016   CO2 25 02/01/2016        Physical Examination:  BP 122/74 mmHg  Pulse 87  Ht 5\' 4"  (1.626 m)  Wt 279 lb (126.554 kg)  BMI 47.87 kg/m2  SpO2 95%        ASSESSMENT:  Diabetes type 2, uncontrolled    See history of present illness for detailed discussion of his current management, blood sugar patterns and problems  identified Her blood sugars are looking better at home recently although A1c is still high at 7.9 She is trying to improve her compliance with diet and exercise recently Also may be benefiting from increasing Jardiance to 25 mg  Hypertension: Controlled with Jardiance alone now  PLAN:   No change in medication regimen  Encouraged her to keep working on improving her diet and exercising regularly  She will keep a diary of her blood sugars for a couple of weeks prior to her next visit  Will need repeat A1c and lipids on the next visit  Patient Instructions  Keep exercising        [facility] 02/04/2016, 8:44 AM   Note: This office note was prepared with Insurance underwriter. Any transcriptional errors that result from this process are unintentional.

## 2016-02-04 NOTE — Patient Instructions (Signed)
Keep exercising!

## 2016-02-05 ENCOUNTER — Ambulatory Visit: Payer: 59 | Admitting: Physical Therapy

## 2016-02-05 DIAGNOSIS — M25552 Pain in left hip: Secondary | ICD-10-CM

## 2016-02-05 DIAGNOSIS — M6281 Muscle weakness (generalized): Secondary | ICD-10-CM

## 2016-02-05 NOTE — Therapy (Signed)
[place], [place], [address] Phone: [phone]   Fax:  [phone]  Physical Therapy Treatment  Patient Details  Name: Julia Estrada MRN: [id number] Date of Birth: 09-07-1970 Referring Provider: Dr. Sharol Given   Encounter Date: 02/05/2016      PT End of Session - 02/05/16 1224    Visit Number 6   Number of Visits 16   Date for PT Re-Evaluation 03/02/16   PT Start Time 9024   PT Stop Time 1225   PT Time Calculation (min) 40 min   Activity Tolerance Patient tolerated treatment well   Behavior During Therapy [facility] for tasks assessed/performed      Past Medical History  Diagnosis Date  . Anemia     iron deficiency  . Obesity     BMI=44  . Vitamin B12 deficiency   . Vitamin D deficiency   . Depression 2011  . Diabetes mellitus 2010    type II  . GERD (gastroesophageal reflux disease) 2004  . Hypertension 2008  . Hyperlipidemia   . Shortness of breath     with exercise  . Anxiety   . Sleep apnea     uses CPAP every night    Past Surgical History  Procedure Laterality Date  . Fibroidectomy  2002    laparotomy for removal of fibroids  . Tonsillectomy and adenoidectomy    . Cesarean section  2004    x 1  . Myomectomy  2002  . Shoulder arthroscopy Left 04/30/2014    Procedure: Left Shoulder Arthroscopy and Debridement;  Surgeon: Newt Minion, MD;  Location: [place];  Service: Orthopedics;  Laterality: Left;  . Wisdom tooth extraction    . Upper gi endoscopy    . Colonoscopy    . Dilitation & currettage/hystroscopy with novasure ablation N/A 01/09/2015    Procedure: DILATATION & CURETTAGE/HYSTEROSCOPY WITH NOVASURE ABLATION;  Surgeon: Harle Battiest, MD;  Location: [facility];  Service: Gynecology;  Laterality: N/A;    There were no vitals filed for this visit.      Subjective Assessment - 02/05/16 1157    Subjective My hip was hurting alot at work today.     Currently in Pain? Yes   Pain Score 5    Pain Location --  as previous             [facility] Adult PT Treatment/Exercise - 02/05/16 1159    Lumbar Exercises: Stretches   Active Hamstring Stretch 3 reps;30 seconds   Active Hamstring Stretch Limitations Rt and L   Single Knee to Chest Stretch 3 reps;30 seconds   Piriformis Stretch 3 reps;30 seconds   Piriformis Stretch Limitations HEP   Lumbar Exercises: Aerobic   Stationary Bike NuStep LE and UE 6 min    Ultrasound   Ultrasound Location L hip   Ultrasound Parameters 100%, 1 MHz, 1.0 W/cm2    Ultrasound Goals Pain   Manual Therapy   Manual Therapy Soft tissue mobilization   Manual therapy comments soft tissue,  trigger point release.  Left hip  mod pressure    Passive ROM L hip ER/IR and addutction                   PT Short Term Goals - 02/03/16 1203    PT SHORT TERM GOAL #1   Title Independent with initial HEP for hip, trunk.   STG = LTG    Status Achieved   PT SHORT TERM GOAL #2  Title Pain decreased 25% with work duties, including sitting for >15 min STG=LTG   Baseline depends on the day    Status Partially Met   PT SHORT TERM GOAL #3   Title Pt will be able to move on mat table (rolll, transfer) without increased pain using good body mechanics STG=LTG   Status Achieved   PT SHORT TERM GOAL #4   Title Pt will be able to walk up stairs to her apt with no increase in pain, 25% less fatigue, dyspnea. STG=LTG   Status On-going   PT SHORT TERM GOAL #5   Title Pt will score less than 30% limited on FOTO STG=LTG    Status On-going                  Plan - 02/05/16 1224    Clinical Impression Statement Patient responded well to manual and [place] today, able to localize source of pain at piriformis L side.  Cont to progress towards goals.   PT Next Visit Plan piriformis stretching, core strength, manual if needed.    PT Home Exercise Plan hip abd, hamstring stretch, ab set with march, seated pelvic tilt, prepilates for stab. , seated piriformis    Consulted and Agree with Plan of Care Patient      Patient will benefit from skilled therapeutic intervention in order to improve the following deficits and impairments:  Increased fascial restricitons, Obesity, Pain, Impaired flexibility, Impaired sensation, Decreased strength, Decreased mobility, Improper body mechanics, Decreased balance  Visit Diagnosis: Pain in left hip  Muscle weakness (generalized)     Problem List Patient Active Problem List   Diagnosis Date Noted  . Frequency of urination 12/15/2015  . Muscle cramps 10/12/2015  . Wheezing 08/04/2015  . Cough 08/04/2015  . Well adult exam 05/12/2015  . Dyslipidemia 10/03/2014  . Allergic rhinitis 04/12/2014  . Mild persistent asthma 04/12/2014  . Dyspnea 04/12/2014  . Left shoulder pain 02/07/2014  . 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 Intertrigo 03/30/2012  . OSA on CPAP 03/30/2012  . GERD (gastroesophageal reflux disease) 04/12/2011  . Abdominal pain, epigastric 05/04/2010  . Unspecified eustachian tube disorder 02/23/2010  . OTITIS MEDIA, LEFT 02/23/2010  . INTERMITTENT VERTIGO 02/23/2010  . NAUSEA 01/18/2010  . LUQ PAIN 01/18/2010  . 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 CHEST PAIN 10/05/2009  . DEPRESSION 09/07/2009  . Obesity 07/17/2009  . Diabetes type 2, uncontrolled ([place]) 04/14/2009  . 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 ELEVATED BP 10/02/2008  . B12 deficiency 08/30/2007  . Vitamin D deficiency 08/30/2007  . HAIR LOSS 08/30/2007  . PARESTHESIA 08/30/2007  . WEIGHT GAIN 08/30/2007  . [facility] DEFICIENCY 05/28/2007    PAA,JENNIFER 02/05/2016, 12:27 PM  [facility] [address] [place], [place], [address] Phone: [phone]   Fax:  [phone]  Name: Julia Estrada MRN: [id number] Date of Birth: 1970-10-22    Raeford Razor, PT 02/05/2016 12:27 PM Phone: [phone] Fax: [phone]

## 2016-02-09 ENCOUNTER — Ambulatory Visit: Payer: 59 | Admitting: Physical Therapy

## 2016-02-09 DIAGNOSIS — R531 Weakness: Secondary | ICD-10-CM

## 2016-02-09 DIAGNOSIS — M6281 Muscle weakness (generalized): Secondary | ICD-10-CM

## 2016-02-09 DIAGNOSIS — M25552 Pain in left hip: Secondary | ICD-10-CM | POA: Diagnosis not present

## 2016-02-09 DIAGNOSIS — M25512 Pain in left shoulder: Secondary | ICD-10-CM

## 2016-02-09 DIAGNOSIS — M25612 Stiffness of left shoulder, not elsewhere classified: Secondary | ICD-10-CM

## 2016-02-09 NOTE — Therapy (Signed)
[place], [place], [address] Phone: [phone]   Fax:  [phone]  Physical Therapy Treatment  Patient Details  Name: Julia Estrada MRN: [id number] Date of Birth: 07-20-1971 Referring Provider: Dr. Sharol Given   Encounter Date: 02/09/2016      PT End of Session - 02/09/16 1154    Visit Number 7   Number of Visits 16   Date for PT Re-Evaluation 03/02/16   PT Start Time 1147   PT Stop Time 1231   PT Time Calculation (min) 44 min   Activity Tolerance Patient tolerated treatment well   Behavior During Therapy [facility] for tasks assessed/performed      Past Medical History  Diagnosis Date  . Anemia     iron deficiency  . Obesity     BMI=44  . Vitamin B12 deficiency   . Vitamin D deficiency   . Depression 2011  . Diabetes mellitus 2010    type II  . GERD (gastroesophageal reflux disease) 2004  . Hypertension 2008  . Hyperlipidemia   . Shortness of breath     with exercise  . Anxiety   . Sleep apnea     uses CPAP every night    Past Surgical History  Procedure Laterality Date  . Fibroidectomy  2002    laparotomy for removal of fibroids  . Tonsillectomy and adenoidectomy    . Cesarean section  2004    x 1  . Myomectomy  2002  . Shoulder arthroscopy Left 04/30/2014    Procedure: Left Shoulder Arthroscopy and Debridement;  Surgeon: Newt Minion, MD;  Location: [facility];  Service: Orthopedics;  Laterality: Left;  . Wisdom tooth extraction    . Upper gi endoscopy    . Colonoscopy    . Dilitation & currettage/hystroscopy with novasure ablation N/A 01/09/2015    Procedure: DILATATION & CURETTAGE/HYSTEROSCOPY WITH NOVASURE ABLATION;  Surgeon: Harle Battiest, MD;  Location: [facility];  Service: Gynecology;  Laterality: N/A;    There were no vitals filed for this visit.      Subjective Assessment - 02/09/16 1152    Subjective Felt good all weekend and then yesterday I was hurting real bad.  Took pain pill and  went to sleep.  Unable to identify cause.     Currently in Pain? Yes   Pain Score 3    Pain Location Hip   Pain Orientation Left   Pain Descriptors / Indicators Aching   Pain Type Chronic pain   Pain Onset More than a month ago   Pain Frequency Intermittent   Aggravating Factors  sitting too long    Pain Relieving Factors med, rest             [facility] Adult PT Treatment/Exercise - 02/09/16 1201    Lumbar Exercises: Stretches   Single Knee to Chest Stretch 3 reps;30 seconds   Lumbar Exercises: Aerobic   Stationary Bike L7, UE and LE for 6 min    Lumbar Exercises: Supine   Clam 20 reps   Clam Limitations on foam roller    Bent Knee Raise 10 reps;5 seconds   Other Supine Lumbar Exercises UE horiz abd and flexion on foam roller    Other Supine Lumbar Exercises unilateral 5 lbs abd and overhead (lat pull down ) x 10 each    Knee/Hip Exercises: Stretches   Piriformis Stretch Left;2 reps;30 seconds   Ultrasound   Ultrasound Location L hip    Ultrasound Parameters 100%,  1 MHz, 1.0 W/cm2   Ultrasound Goals Pain   Manual Therapy   Manual Therapy Soft tissue mobilization   Manual therapy comments soft tissue,  trigger point release.  Left hip  mod pressure    Passive ROM L hip ER/IR and addutction                   PT Short Term Goals - 02/03/16 1203    PT SHORT TERM GOAL #1   Title Independent with initial HEP for hip, trunk.   STG = LTG    Status Achieved   PT SHORT TERM GOAL #2   Title Pain decreased 25% with work duties, including sitting for >15 min STG=LTG   Baseline depends on the day    Status Partially Met   PT SHORT TERM GOAL #3   Title Pt will be able to move on mat table (rolll, transfer) without increased pain using good body mechanics STG=LTG   Status Achieved   PT SHORT TERM GOAL #4   Title Pt will be able to walk up stairs to her apt with no increase in pain, 25% less fatigue, dyspnea. STG=LTG   Status On-going   PT SHORT TERM GOAL #5   Title Pt  will score less than 30% limited on FOTO STG=LTG    Status On-going                  Plan - 02/09/16 1238    Clinical Impression Statement Patient doing well, seems to be having less pain overall and able to sit longer periods without increasing hip pain.    PT Next Visit Plan piriformis stretching, core strength, manual if needed. ask specific goals and see if she needs to cont beyond this week, FOTO?    PT Home Exercise Plan hip abd, hamstring stretch, ab set with march, seated pelvic tilt, prepilates for stab. , seated piriformis   Consulted and Agree with Plan of Care Patient      Patient will benefit from skilled therapeutic intervention in order to improve the following deficits and impairments:  Increased fascial restricitons, Obesity, Pain, Impaired flexibility, Impaired sensation, Decreased strength, Decreased mobility, Improper body mechanics, Decreased balance  Visit Diagnosis: Pain in left hip  Muscle weakness (generalized)  Pain in joint, shoulder region, left  Weakness  Stiffness of joint, shoulder region, left     Problem List Patient Active Problem List   Diagnosis Date Noted  . Frequency of urination 12/15/2015  . Muscle cramps 10/12/2015  . Wheezing 08/04/2015  . Cough 08/04/2015  . Well adult exam 05/12/2015  . Dyslipidemia 10/03/2014  . Allergic rhinitis 04/12/2014  . Mild persistent asthma 04/12/2014  . Dyspnea 04/12/2014  . Left shoulder pain 02/07/2014  . 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 Intertrigo 03/30/2012  . OSA on CPAP 03/30/2012  . GERD (gastroesophageal reflux disease) 04/12/2011  . Abdominal pain, epigastric 05/04/2010  . Unspecified eustachian tube disorder 02/23/2010  . OTITIS MEDIA, LEFT 02/23/2010  . INTERMITTENT VERTIGO 02/23/2010  . NAUSEA 01/18/2010  . LUQ PAIN 01/18/2010  . 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 CHEST PAIN 10/05/2009  . DEPRESSION 09/07/2009  . Obesity 07/17/2009  . Diabetes type 2, uncontrolled  ([place]) 04/14/2009  . 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 ELEVATED BP 10/02/2008  . B12 deficiency 08/30/2007  . Vitamin D deficiency 08/30/2007  . HAIR LOSS 08/30/2007  . PARESTHESIA 08/30/2007  . WEIGHT GAIN 08/30/2007  . ANEMIA-IRON DEFICIENCY 05/28/2007    PAA,JENNIFER 02/09/2016, 12:45 PM  [facility] [address] [place], [place], [address] Phone: [phone]   Fax:  [phone]  Name: CHERIE LASALLE MRN: [id number] Date of Birth: 22-Oct-1970    Raeford Razor, PT 02/09/2016 12:45 PM Phone: [phone] Fax: [phone]

## 2016-02-12 ENCOUNTER — Ambulatory Visit: Payer: 59 | Admitting: Physical Therapy

## 2016-02-12 DIAGNOSIS — M25552 Pain in left hip: Secondary | ICD-10-CM | POA: Diagnosis not present

## 2016-02-12 DIAGNOSIS — M6281 Muscle weakness (generalized): Secondary | ICD-10-CM

## 2016-02-12 NOTE — Therapy (Signed)
[place], [place], [address] Phone: [phone]   Fax:  [phone]  Physical Therapy Treatment  Patient Details  Name: Julia Estrada MRN: [id number] Date of Birth: 1971-04-13 Referring Provider: Dr. Sharol Given   Encounter Date: 02/12/2016      PT End of Session - 02/12/16 1150    Visit Number 8   Number of Visits 16   Date for PT Re-Evaluation 03/02/16   PT Start Time 1146   PT Stop Time 1230   PT Time Calculation (min) 44 min   Activity Tolerance Patient tolerated treatment well   Behavior During Therapy [facility] for tasks assessed/performed      Past Medical History  Diagnosis Date  . Anemia     iron deficiency  . Obesity     BMI=44  . Vitamin B12 deficiency   . Vitamin D deficiency   . Depression 2011  . Diabetes mellitus 2010    type II  . GERD (gastroesophageal reflux disease) 2004  . Hypertension 2008  . Hyperlipidemia   . Shortness of breath     with exercise  . Anxiety   . Sleep apnea     uses CPAP every night    Past Surgical History  Procedure Laterality Date  . Fibroidectomy  2002    laparotomy for removal of fibroids  . Tonsillectomy and adenoidectomy    . Cesarean section  2004    x 1  . Myomectomy  2002  . Shoulder arthroscopy Left 04/30/2014    Procedure: Left Shoulder Arthroscopy and Debridement;  Surgeon: Newt Minion, MD;  Location: [facility];  Service: Orthopedics;  Laterality: Left;  . Wisdom tooth extraction    . Upper gi endoscopy    . Colonoscopy    . Dilitation & currettage/hystroscopy with novasure ablation N/A 01/09/2015    Procedure: DILATATION & CURETTAGE/HYSTEROSCOPY WITH NOVASURE ABLATION;  Surgeon: Harle Battiest, MD;  Location: [facility];  Service: Gynecology;  Laterality: N/A;    There were no vitals filed for this visit.      Subjective Assessment - 02/12/16 1151    Subjective No pain right now, hurt this AM.     Currently in Pain? No/denies          [facility]  Adult PT Treatment/Exercise - 02/12/16 1152    Lumbar Exercises: Supine   Clam 20 reps   Bridge 10 reps   Bridge Limitations added clam blue band x 10    Lumbar Exercises: Prone   Single Arm Raise 10 reps   Straight Leg Raise 10 reps   Opposite Arm/Leg Raise Right arm/Left leg;Left arm/Right leg;10 reps   Other Prone Lumbar Exercises knee flexion with Tr A    Other Prone Lumbar Exercises Prone isometric Tr A x 10    Knee/Hip Exercises: Stretches   Piriformis Stretch Both;3 reps;30 seconds   Ultrasound   Ultrasound Location L hip   Ultrasound Parameters 100%, 1 MHz , 1.2 W/cm2   Ultrasound Goals Pain      hip abd in sidelying x 10 each . Increased time to complete on L side.       PT Education - 02/12/16 1233    Education provided Yes   Education Details hep   Person(s) Educated Patient   Methods Explanation   Comprehension Verbalized understanding          PT Short Term Goals - 02/12/16 1208    PT SHORT TERM GOAL #1   Title  Independent with initial HEP for hip, trunk.   STG = LTG    Status Achieved   PT SHORT TERM GOAL #2   Title Pain decreased 25% with work duties, including sitting for >15 min STG=LTG   Status Partially Met   PT SHORT TERM GOAL #3   Title Pt will be able to move on mat table (rolll, transfer) without increased pain using good body mechanics STG=LTG   Status Achieved   PT SHORT TERM GOAL #4   Title Pt will be able to walk up stairs to her apt with no increase in pain, 25% less fatigue, dyspnea. STG=LTG   Status On-going   PT SHORT TERM GOAL #5   Title Pt will score less than 30% limited on FOTO STG=LTG    Status On-going          Plan - 02/12/16 1215    Clinical Impression Statement Pain has become more intermittent.   L hip weaker in abduction, pain increased slightly.  Added to HEP today.  FOTO score improved 14%   PT Next Visit Plan piriformis stretching, core strength, manual if needed, [place]   PT Home Exercise Plan hip abd, hamstring  stretch, ab set with march, seated pelvic tilt, prepilates for stab. , seated piriformis, hip abd and clamin SL    Consulted and Agree with Plan of Care Patient      Patient will benefit from skilled therapeutic intervention in order to improve the following deficits and impairments:  Increased fascial restricitons, Obesity, Pain, Impaired flexibility, Impaired sensation, Decreased strength, Decreased mobility, Improper body mechanics, Decreased balance  Visit Diagnosis: Pain in left hip  Muscle weakness (generalized)     Problem List Patient Active Problem List   Diagnosis Date Noted  . Frequency of urination 12/15/2015  . Muscle cramps 10/12/2015  . Wheezing 08/04/2015  . Cough 08/04/2015  . Well adult exam 05/12/2015  . Dyslipidemia 10/03/2014  . Allergic rhinitis 04/12/2014  . Mild persistent asthma 04/12/2014  . Dyspnea 04/12/2014  . Left shoulder pain 02/07/2014  . 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 Intertrigo 03/30/2012  . OSA on CPAP 03/30/2012  . GERD (gastroesophageal reflux disease) 04/12/2011  . Abdominal pain, epigastric 05/04/2010  . Unspecified eustachian tube disorder 02/23/2010  . OTITIS MEDIA, LEFT 02/23/2010  . INTERMITTENT VERTIGO 02/23/2010  . NAUSEA 01/18/2010  . LUQ PAIN 01/18/2010  . 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 CHEST PAIN 10/05/2009  . DEPRESSION 09/07/2009  . Obesity 07/17/2009  . Diabetes type 2, uncontrolled ([place]) 04/14/2009  . 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 ELEVATED BP 10/02/2008  . B12 deficiency 08/30/2007  . Vitamin D deficiency 08/30/2007  . HAIR LOSS 08/30/2007  . PARESTHESIA 08/30/2007  . WEIGHT GAIN 08/30/2007  . [facility] DEFICIENCY 05/28/2007    PAA,JENNIFER 02/12/2016, 12:36 PM  [facility] [address] [place], [place], [address] Phone: [phone]   Fax:  [phone]  Name: Julia Estrada MRN: [id number] Date of Birth: [birth year]    Raeford Razor, PT 02/12/2016  12:36 PM Phone: [phone] Fax: [phone]

## 2016-02-12 NOTE — Patient Instructions (Signed)
Abduction: Clam (Eccentric) - Side-Lying    Lie on side with knees bent. Lift top knee, keeping feet together. Keep trunk steady. Slowly lower for 3-5 seconds. __10_ reps per set, __1-2_ sets per day, _3-5__ days per week. Add ___ lbs when you achieve ___ repetitions.  http://ecce.exer.us/65   Copyright  VHI. All rights reserved.  ABDUCTION: Side-Lying (Active)    Lie on left side, top leg straight. Raise top leg as far as possible. Use ___ lbs. Complete __1-2_ sets of _10__ repetitions. Perform _1__ sessions per day.  http://gtsc.exer.us/95   Copyright  VHI. All rights reserved.

## 2016-02-15 ENCOUNTER — Other Ambulatory Visit: Payer: Self-pay | Admitting: Allergy and Immunology

## 2016-02-15 ENCOUNTER — Ambulatory Visit: Payer: 59 | Admitting: Physical Therapy

## 2016-02-15 ENCOUNTER — Telehealth: Payer: Self-pay | Admitting: Family

## 2016-02-15 ENCOUNTER — Other Ambulatory Visit: Payer: Self-pay | Admitting: Endocrinology

## 2016-02-15 DIAGNOSIS — M6281 Muscle weakness (generalized): Secondary | ICD-10-CM

## 2016-02-15 DIAGNOSIS — M25552 Pain in left hip: Secondary | ICD-10-CM

## 2016-02-15 NOTE — Therapy (Signed)
[place], [place], [address] Phone: [phone]   Fax:  [phone]  Physical Therapy Treatment  Patient Details  Name: Julia Estrada MRN: [id number] Date of Birth: Jan 10, 1971 Referring Provider: Dr. Sharol Given   Encounter Date: 02/15/2016      PT End of Session - 02/15/16 1321    Visit Number 10   Date for PT Re-Evaluation 03/02/16      Past Medical History  Diagnosis Date  . Anemia     iron deficiency  . Obesity     BMI=44  . Vitamin B12 deficiency   . Vitamin D deficiency   . Depression 2011  . Diabetes mellitus 2010    type II  . GERD (gastroesophageal reflux disease) 2004  . Hypertension 2008  . Hyperlipidemia   . Shortness of breath     with exercise  . Anxiety   . Sleep apnea     uses CPAP every night    Past Surgical History  Procedure Laterality Date  . Fibroidectomy  2002    laparotomy for removal of fibroids  . Tonsillectomy and adenoidectomy    . Cesarean section  2004    x 1  . Myomectomy  2002  . Shoulder arthroscopy Left 04/30/2014    Procedure: Left Shoulder Arthroscopy and Debridement;  Surgeon: Newt Minion, MD;  Location: [facility];  Service: Orthopedics;  Laterality: Left;  . Wisdom tooth extraction    . Upper gi endoscopy    . Colonoscopy    . Dilitation & currettage/hystroscopy with novasure ablation N/A 01/09/2015    Procedure: DILATATION & CURETTAGE/HYSTEROSCOPY WITH NOVASURE ABLATION;  Surgeon: Harle Battiest, MD;  Location: [facility];  Service: Gynecology;  Laterality: N/A;    There were no vitals filed for this visit.      Subjective Assessment - 02/15/16 1120    Subjective No pain yet today.   Sleeping without pain   Currently in Pain? No/denies   Pain Location Hip   Pain Orientation Left   Pain Radiating Towards to knee (Not since last weel   Pain Frequency Intermittent   Aggravating Factors  sitting too long   Pain Relieving Factors [place] massage                          [facility] Adult PT Treatment/Exercise - 02/15/16 0001    Lumbar Exercises: Stretches   Passive Hamstring Stretch 3 reps;30 seconds  using sheet  Right less tight than left.   Piriformis Stretch 3 reps;30 seconds   Lumbar Exercises: Seated   Hip Flexion on Ball 10 reps  sitting with abdominals  tight   Other Seated Lumbar Exercises Seated tilt anterior 5 x 5 seconds   Lumbar Exercises: Supine   Ab Set 5 reps  on foam roller.    Bent Knee Raise 10 reps  on foam roller   Moist Heat Therapy   Number Minutes Moist Heat 15 Minutes   Moist Heat Location Hip   Ultrasound   Ultrasound Location Left hip   Ultrasound Parameters 100%, 1,2 watts/cm2,  8 minutes   Ultrasound Goals Pain                  PT Short Term Goals - 02/15/16 1106    PT SHORT TERM GOAL #2   Title Pain decreased 25% with work duties, including sitting for >15 min STG=LTG   Baseline 75 % improved.  ususaly,  Time 4   Period Weeks   Status Achieved   PT SHORT TERM GOAL #3   Title Pt will be able to move on mat table (rolll, transfer) without increased pain using good body mechanics STG=LTG   Time 4   Period Weeks   Status Achieved   PT SHORT TERM GOAL #4   Title Pt will be able to walk up stairs to her apt with no increase in pain, 25% less fatigue, dyspnea. STG=LTG   Baseline Able to breath easier on steps up,  No pain with going up steps lately.  fatigue improved at least 25% (02/15/2016)   Time 4   Period Weeks   Status Achieved   PT SHORT TERM GOAL #5   Title Pt will score less than 30% limited on FOTO STG=LTG    Time 4   Period Weeks   Status Unable to assess                  Plan - 02/15/16 1316    Clinical Impression Statement No new objective findings today except she has not had any pain since last week.  She requested to continue through her full POC to continue work with her core, ROM.  No pain at the end of session.  All STG'S met except  for #4.   PT Next Visit Plan piriformis stretching, core strength, manual if needed, [place]   PT Home Exercise Plan continue.   Consulted and Agree with Plan of Care Patient      Patient will benefit from skilled therapeutic intervention in order to improve the following deficits and impairments:     Visit Diagnosis: Pain in left hip  Muscle weakness (generalized)     Problem List Patient Active Problem List   Diagnosis Date Noted  . Frequency of urination 12/15/2015  . Muscle cramps 10/12/2015  . Wheezing 08/04/2015  . Cough 08/04/2015  . Well adult exam 05/12/2015  . Dyslipidemia 10/03/2014  . Allergic rhinitis 04/12/2014  . Mild persistent asthma 04/12/2014  . Dyspnea 04/12/2014  . Left shoulder pain 02/07/2014  . 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 Intertrigo 03/30/2012  . OSA on CPAP 03/30/2012  . GERD (gastroesophageal reflux disease) 04/12/2011  . Abdominal pain, epigastric 05/04/2010  . Unspecified eustachian tube disorder 02/23/2010  . OTITIS MEDIA, LEFT 02/23/2010  . INTERMITTENT VERTIGO 02/23/2010  . NAUSEA 01/18/2010  . LUQ PAIN 01/18/2010  . 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 CHEST PAIN 10/05/2009  . DEPRESSION 09/07/2009  . Obesity 07/17/2009  . Diabetes type 2, uncontrolled ([place]) 04/14/2009  . 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 ELEVATED BP 10/02/2008  . B12 deficiency 08/30/2007  . Vitamin D deficiency 08/30/2007  . HAIR LOSS 08/30/2007  . PARESTHESIA 08/30/2007  . WEIGHT GAIN 08/30/2007  . ANEMIA-IRON DEFICIENCY 05/28/2007    HARRIS,KAREN 02/15/2016, 1:22 PM  [facility] [address] [place], [place], [address] Phone: [phone]   Fax:  [phone]  Name: Julia Estrada MRN: [id number] Date of Birth: 11-Apr-1971    Melvenia Needles, PTA 02/15/2016 1:22 PM Phone: [phone] Fax: [phone]

## 2016-02-15 NOTE — Telephone Encounter (Signed)
Patient states she seen Tammy SoursGreg on 3/13.  She was billed because [facility]  Patient has called her insurance company and they state to call our billing department.  Patient states when she calls our billing department they state for her to call her insurance company.  Can you please get someone to check on this and follow up?

## 2016-02-16 NOTE — Telephone Encounter (Signed)
Email to billing to research this dos, I will contact patient with their response.

## 2016-02-17 ENCOUNTER — Telehealth: Payer: Self-pay | Admitting: Endocrinology

## 2016-02-17 MED ORDER — FLUCONAZOLE 150 MG PO TABS: 150.0000 mg | ORAL_TABLET | Freq: Once | ORAL | Status: DC

## 2016-02-17 NOTE — Telephone Encounter (Signed)
PT stated the Jardience is giving her a yeast infection also said that she had to start back taking Spirionlactone and it is making her urinate more frequently.

## 2016-02-17 NOTE — Telephone Encounter (Signed)
I contacted the pt. She stated she started the Aldactone back because she had been experiencing headaches and she had her blood pressure taken at work and the result was 148/98.

## 2016-02-17 NOTE — Telephone Encounter (Signed)
She can have Diflucan 150 mg one tablet, 1 refill.  Please determine why she is taking Aldactone again

## 2016-02-17 NOTE — Telephone Encounter (Signed)
Since Aldactone causes frequent urination she needs to talk to her PCP about taking something else

## 2016-02-18 ENCOUNTER — Ambulatory Visit: Payer: 59 | Admitting: Physical Therapy

## 2016-02-18 ENCOUNTER — Other Ambulatory Visit: Payer: Self-pay | Admitting: Endocrinology

## 2016-02-18 ENCOUNTER — Encounter: Payer: Self-pay | Admitting: Internal Medicine

## 2016-02-18 DIAGNOSIS — M25552 Pain in left hip: Secondary | ICD-10-CM | POA: Diagnosis not present

## 2016-02-18 DIAGNOSIS — M6281 Muscle weakness (generalized): Secondary | ICD-10-CM

## 2016-02-18 NOTE — Telephone Encounter (Signed)
Patient notified and will talk with Dr. Posey ReaPlotnikov.

## 2016-02-18 NOTE — Therapy (Signed)
[facility] [address] [place], [place], [address] Phone: [phone]   Fax:  [phone]  Physical Therapy Treatment  Patient Details  Name: Julia Estrada MRN: [id number] Date of Birth: 07/28/1971 Referring Provider: Dr. Lajoyce Estrada   Encounter Date: 02/18/2016      PT End of Session - 02/18/16 1257    Visit Number 11   Number of Visits 16   Date for PT Re-Evaluation 03/02/16   PT Start Time 1149   PT Stop Time 1230   PT Time Calculation (min) 41 min   Activity Tolerance Patient tolerated treatment well;No increased pain   Behavior During Therapy [facility] for tasks assessed/performed      Past Medical History  Diagnosis Date  . Anemia     iron deficiency  . Obesity     BMI=44  . Vitamin B12 deficiency   . Vitamin D deficiency   . Depression 2011  . Diabetes mellitus 2010    type II  . GERD (gastroesophageal reflux disease) 2004  . Hypertension 2008  . Hyperlipidemia   . Shortness of breath     with exercise  . Anxiety   . Sleep apnea     uses CPAP every night    Past Surgical History  Procedure Laterality Date  . Fibroidectomy  2002    laparotomy for removal of fibroids  . Tonsillectomy and adenoidectomy    . Cesarean section  2004    x 1  . Myomectomy  2002  . Shoulder arthroscopy Left 04/30/2014    Procedure: Left Shoulder Arthroscopy and Debridement;  Surgeon: Julia Mustard, MD;  Location: [facility] OR;  Service: Orthopedics;  Laterality: Left;  . Wisdom tooth extraction    . Upper gi endoscopy    . Colonoscopy    . Dilitation & currettage/hystroscopy with novasure ablation N/A 01/09/2015    Procedure: DILATATION & CURETTAGE/HYSTEROSCOPY WITH NOVASURE ABLATION;  Surgeon: Julia Kail, MD;  Location: [facility]S;  Service: Gynecology;  Laterality: N/A;    There were no vitals filed for this visit.      Subjective Assessment - 02/18/16 1151    Subjective 3/10    Currently in Pain? Yes   Pain Score 0-No pain  up to 3/10    Pain Location --  gluteal.   Aggravating Factors  sitting in her car, immediate,  longer in her chair at work   Pain Relieving Factors PT                         [facility] Adult PT Treatment/Exercise - 02/18/16 0001    Lumbar Exercises: Stretches   Piriformis Stretch 3 reps;30 seconds   Lumbar Exercises: Standing   Side Lunge 5 reps  2 steps T right/left each,  5X with green band,  HEP   Lumbar Exercises: Supine   Bridge 10 reps  cues,  HEP   Lumbar Exercises: Sidelying   Clam 5 reps  2 sets of 5 left, 10 x 1 right  HEP, green band   Lumbar Exercises: Prone   Straight Leg Raises Limitations 10x 2 sets straight and bent  HEP   Ultrasound   Ultrasound Location LtHip   Ultrasound Parameters 100%, 1.5 watts/cm2   Ultrasound Goals Pain                PT Education - 02/18/16 1257    Education provided Yes   Education Details Hip Strength JOSPT   Person(s) Educated  Patient   Methods Explanation;Tactile cues;Verbal cues;Handout   Comprehension Verbalized understanding;Returned demonstration          PT Short Term Goals - 02/15/16 1106    PT SHORT TERM GOAL #2   Title Pain decreased 25% with work duties, including sitting for >15 min STG=LTG   Baseline 75 % improved.  ususaly,     Time 4   Period Weeks   Status Achieved   PT SHORT TERM GOAL #3   Title Pt will be able to move on mat table (rolll, transfer) without increased pain using good body mechanics STG=LTG   Time 4   Period Weeks   Status Achieved   PT SHORT TERM GOAL #4   Title Pt will be able to walk up stairs to her apt with no increase in pain, 25% less fatigue, dyspnea. STG=LTG   Baseline Able to breath easier on steps up,  No pain with going up steps lately.  fatigue improved at least 25% (02/15/2016)   Time 4   Period Weeks   Status Achieved   PT SHORT TERM GOAL #5   Title Pt will score less than 30% limited on FOTO STG=LTG    Time 4   Period Weeks   Status Unable to assess                   Plan - 02/18/16 1258    Clinical Impression Statement Progressed home exercises for hip strengthening from JOSPT.   She can tolerate Clams with band today without pain.     PT Next Visit Plan hip strengthening review, US Manual as needed.  POC ending soon.   PT Home Exercise Plan JOSPT hip strengthening   Consulted and Agree with Plan of Care Patient      Patient will benefit from skilled therapeutic intervention in order to improve the following deficits and impairments:  Increased fascial restricitons, Obesity, Pain, Impaired flexibility, Impaired sensation, Decreased strength, Decreased mobility, Improper body mechanics, Decreased balance  Visit Diagnosis: Pain in left hip  Muscle weakness (generalized)     Problem List Patient Active Problem List   Diagnosis Date Noted  . Frequency of urination 12/15/2015  . Muscle cramps 10/12/2015  . Wheezing 08/04/2015  . Cough 08/04/2015  . Well adult exam 05/12/2015  . Dyslipidemia 10/03/2014  . Allergic rhinitis 04/12/2014  . Mild persistent asthma 04/12/2014  . Dyspnea 04/12/2014  . Left shoulder pain 02/07/2014  . 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 Intertrigo 03/30/2012  . OSA on CPAP 03/30/2012  . GERD (gastroesophageal reflux disease) 04/12/2011  . Abdominal pain, epigastric 05/04/2010  . Unspecified eustachian tube disorder 02/23/2010  . OTITIS MEDIA, LEFT 02/23/2010  . INTERMITTENT VERTIGO 02/23/2010  . NAUSEA 01/18/2010  . LUQ PAIN 01/18/2010  . 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 CHEST PAIN 10/05/2009  . DEPRESSION 09/07/2009  . Obesity 07/17/2009  . Diabetes type 2, uncontrolled (HCC) 04/14/2009  . 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 ELEVATED BP 10/02/2008  . B12 deficiency 08/30/2007  . Vitamin D deficiency 08/30/2007  . HAIR LOSS 08/30/2007  . PARESTHESIA 08/30/2007  . WEIGHT GAIN 08/30/2007  . ANEMIA-IRON DEFICIENCY 05/28/2007    Julia Estrada 02/18/2016, 1:01 PM  [facility] [address] [place], [place], [address] Phone: [phone]   Fax:  [phone]  Name: Julia Estrada MRN: [id number] Date of Birth: 11/24/1970    Liz BeachKaren Harris, PTA 02/18/2016 1:01 PM Phone: [phone] Fax: [phone]

## 2016-02-18 NOTE — Patient Instructions (Addendum)
Bridge    Lie back, legs bent. Inhale,to prepare.  Breath out with pressing hips up.  Inhale, rolling down along spine from top. Repeat ___10_ times. Do __1__ sessions per day.  http://pm.exer.us/55   Copyright  VHI. All rights reserved.  Exercises issued from exercise drawer  For hip strengthening.  Modifications include:  Quadriped exercises from hands vs forearms.  Both legs used for bridges.  3-4 x a week 5-10 X each.

## 2016-02-20 ENCOUNTER — Other Ambulatory Visit: Payer: Self-pay | Admitting: Internal Medicine

## 2016-02-20 MED ORDER — OLMESARTAN MEDOXOMIL 40 MG PO TABS: 40.0000 mg | ORAL_TABLET | Freq: Every day | ORAL | Status: DC

## 2016-02-21 ENCOUNTER — Other Ambulatory Visit: Payer: Self-pay | Admitting: Internal Medicine

## 2016-02-22 ENCOUNTER — Ambulatory Visit: Payer: 59 | Admitting: Physical Therapy

## 2016-02-22 DIAGNOSIS — M25552 Pain in left hip: Secondary | ICD-10-CM | POA: Diagnosis not present

## 2016-02-22 DIAGNOSIS — M6281 Muscle weakness (generalized): Secondary | ICD-10-CM

## 2016-02-22 NOTE — Therapy (Signed)
[facility] [address] [place], [place], [address] Phone: [phone]   Fax:  [phone]  Physical Therapy Treatment  Patient Details  Name: Julia Estrada MRN: [id number] Date of Birth: May 26, 1971 Referring Provider: Dr. Lajoyce Corners   Encounter Date: 02/22/2016      PT End of Session - 02/22/16 1302    Visit Number 12   Number of Visits 16   Date for PT Re-Evaluation 03/02/16   PT Start Time 1149   PT Stop Time 1229   PT Time Calculation (min) 40 min   Activity Tolerance Patient tolerated treatment well   Behavior During Therapy [facility] for tasks assessed/performed      Past Medical History:  Diagnosis Date  . Anemia    iron deficiency  . Anxiety   . Depression 2011  . Diabetes mellitus 2010   type II  . GERD (gastroesophageal reflux disease) 2004  . Hyperlipidemia   . Hypertension 2008  . Obesity    BMI=44  . Shortness of breath    with exercise  . Sleep apnea    uses CPAP every night  . Vitamin B12 deficiency   . Vitamin D deficiency     Past Surgical History:  Procedure Laterality Date  . CESAREAN SECTION  2004   x 1  . COLONOSCOPY    . DILITATION & CURRETTAGE/HYSTROSCOPY WITH NOVASURE ABLATION N/A 01/09/2015   Procedure: DILATATION & CURETTAGE/HYSTEROSCOPY WITH NOVASURE ABLATION;  Surgeon: Donovan Kail, MD;  Location: [facility];  Service: Gynecology;  Laterality: N/A;  . fibroidectomy  2002   laparotomy for removal of fibroids  . MYOMECTOMY  2002  . SHOULDER ARTHROSCOPY Left 04/30/2014   Procedure: Left Shoulder Arthroscopy and Debridement;  Surgeon: Nadara Mustard, MD;  Location: [facility] OR;  Service: Orthopedics;  Laterality: Left;  . TONSILLECTOMY AND ADENOIDECTOMY    . UPPER GI ENDOSCOPY    . WISDOM TOOTH EXTRACTION      There were no vitals filed for this visit.      Subjective Assessment - 02/22/16 1153    Subjective 3-4  into leg    Currently in Pain? Yes   Pain Score 4    Pain Location Back   Pain  Orientation Left   Pain Descriptors / Indicators Aching   Pain Radiating Towards into thigh   Pain Frequency Intermittent   Aggravating Factors  moving around,  Started into leg Sat   Pain Relieving Factors PT                         [facility] Adult PT Treatment/Exercise - 02/22/16 0001      Lumbar Exercises: Stretches   Passive Hamstring Stretch 3 reps;30 seconds   Double Knee to Chest Stretch --  10 X legs on pink ball.  cues low back stabilization initial   Lower Trunk Rotation --  10 X with legs on ball,Min assist to keep ball from sliding    Standing Extension 1 rep  15 seconds   Piriformis Stretch 2 reps;30 seconds  2 positions     Lumbar Exercises: Supine   Bridge 10 reps  2 sets   Straight Leg Raise 10 reps  2 sets.  Patient uses momentum     Ultrasound   Ultrasound Location LT hip   Ultrasound Parameters 100%, 1.5 watts/cm2, 8 minutes   Ultrasound Goals Pain  PT Short Term Goals - 02/22/16 1306      PT SHORT TERM GOAL #1   Title Independent with initial HEP for hip, trunk.   STG = LTG    Status Achieved     PT SHORT TERM GOAL #2   Title Pain decreased 25% with work duties, including sitting for >15 min STG=LTG   Status Achieved     PT SHORT TERM GOAL #3   Title Pt will be able to move on mat table (rolll, transfer) without increased pain using good body mechanics STG=LTG   Status Achieved     PT SHORT TERM GOAL #4   Title Pt will be able to walk up stairs to her apt with no increase in pain, 25% less fatigue, dyspnea. STG=LTG   Status Achieved     PT SHORT TERM GOAL #5   Title Pt will score less than 30% limited on FOTO STG=LTG    Time 4   Period Weeks   Status Unable to assess                  Plan - 02/22/16 1304    Clinical Impression Statement Pain returned to thigh Saturday with working.  No pain at the end of session today.  She has 1 more visit scheduled,  03/02/2016    PT Next Visit Plan  review home exercises ,  D/C?  did not discuss today   PT Home Exercise Plan continue   Consulted and Agree with Plan of Care Patient      Patient will benefit from skilled therapeutic intervention in order to improve the following deficits and impairments:  Increased fascial restricitons, Obesity, Pain, Impaired flexibility, Impaired sensation, Decreased strength, Decreased mobility, Improper body mechanics, Decreased balance  Visit Diagnosis: Pain in left hip  Muscle weakness (generalized)     Problem List Patient Active Problem List   Diagnosis Date Noted  . Frequency of urination 12/15/2015  . Muscle cramps 10/12/2015  . Wheezing 08/04/2015  . Cough 08/04/2015  . Well adult exam 05/12/2015  . Dyslipidemia 10/03/2014  . Allergic rhinitis 04/12/2014  . Mild persistent asthma 04/12/2014  . Dyspnea 04/12/2014  . Left shoulder pain 02/07/2014  . 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 Intertrigo 03/30/2012  . OSA on CPAP 03/30/2012  . GERD (gastroesophageal reflux disease) 04/12/2011  . Abdominal pain, epigastric 05/04/2010  . Unspecified eustachian tube disorder 02/23/2010  . OTITIS MEDIA, LEFT 02/23/2010  . INTERMITTENT VERTIGO 02/23/2010  . NAUSEA 01/18/2010  . LUQ PAIN 01/18/2010  . 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 CHEST PAIN 10/05/2009  . DEPRESSION 09/07/2009  . Obesity 07/17/2009  . Diabetes type 2, uncontrolled (HCC) 04/14/2009  . 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 ELEVATED BP 10/02/2008  . B12 deficiency 08/30/2007  . Vitamin D deficiency 08/30/2007  . HAIR LOSS 08/30/2007  . PARESTHESIA 08/30/2007  . WEIGHT GAIN 08/30/2007  . [facility] DEFICIENCY 05/28/2007    HARRIS,KAREN 02/22/2016, 1:09 PM  [facility] [address] [place], [place], [address] Phone: [phone]   Fax:  [phone]  Name: AVANTHIKA DEHNERT MRN: [id number] Date of Birth: 10/02/1970   Liz Beach, PTA 02/22/16 1:10 PM Phone:  [phone] Fax: [phone]

## 2016-02-22 NOTE — Therapy (Signed)
[facility] [address] [place], [place], [address] Phone: [phone]   Fax:  [phone]  Physical Therapy Treatment  Patient Details  Name: Julia Estrada MRN: [id number] Date of Birth: May 26, 1971 Referring Provider: Dr. Lajoyce Corners   Encounter Date: 02/22/2016      PT End of Session - 02/22/16 1302    Visit Number 12   Number of Visits 16   Date for PT Re-Evaluation 03/02/16   PT Start Time 1149   PT Stop Time 1229   PT Time Calculation (min) 40 min   Activity Tolerance Patient tolerated treatment well   Behavior During Therapy [facility] for tasks assessed/performed      Past Medical History:  Diagnosis Date  . Anemia    iron deficiency  . Anxiety   . Depression 2011  . Diabetes mellitus 2010   type II  . GERD (gastroesophageal reflux disease) 2004  . Hyperlipidemia   . Hypertension 2008  . Obesity    BMI=44  . Shortness of breath    with exercise  . Sleep apnea    uses CPAP every night  . Vitamin B12 deficiency   . Vitamin D deficiency     Past Surgical History:  Procedure Laterality Date  . CESAREAN SECTION  2004   x 1  . COLONOSCOPY    . DILITATION & CURRETTAGE/HYSTROSCOPY WITH NOVASURE ABLATION N/A 01/09/2015   Procedure: DILATATION & CURETTAGE/HYSTEROSCOPY WITH NOVASURE ABLATION;  Surgeon: Donovan Kail, MD;  Location: [facility];  Service: Gynecology;  Laterality: N/A;  . fibroidectomy  2002   laparotomy for removal of fibroids  . MYOMECTOMY  2002  . SHOULDER ARTHROSCOPY Left 04/30/2014   Procedure: Left Shoulder Arthroscopy and Debridement;  Surgeon: Nadara Mustard, MD;  Location: [facility] OR;  Service: Orthopedics;  Laterality: Left;  . TONSILLECTOMY AND ADENOIDECTOMY    . UPPER GI ENDOSCOPY    . WISDOM TOOTH EXTRACTION      There were no vitals filed for this visit.      Subjective Assessment - 02/22/16 1153    Subjective 3-4  into leg    Currently in Pain? Yes   Pain Score 4    Pain Location Back   Pain  Orientation Left   Pain Descriptors / Indicators Aching   Pain Radiating Towards into thigh   Pain Frequency Intermittent   Aggravating Factors  moving around,  Started into leg Sat   Pain Relieving Factors PT                         [facility] Adult PT Treatment/Exercise - 02/22/16 0001      Lumbar Exercises: Stretches   Passive Hamstring Stretch 3 reps;30 seconds   Double Knee to Chest Stretch --  10 X legs on pink ball.  cues low back stabilization initial   Lower Trunk Rotation --  10 X with legs on ball,Min assist to keep ball from sliding    Standing Extension 1 rep  15 seconds   Piriformis Stretch 2 reps;30 seconds  2 positions     Lumbar Exercises: Supine   Bridge 10 reps  2 sets   Straight Leg Raise 10 reps  2 sets.  Patient uses momentum     Ultrasound   Ultrasound Location LT hip   Ultrasound Parameters 100%, 1.5 watts/cm2, 8 minutes   Ultrasound Goals Pain  PT Short Term Goals - 02/22/16 1306      PT SHORT TERM GOAL #1   Title Independent with initial HEP for hip, trunk.   STG = LTG    Status Achieved     PT SHORT TERM GOAL #2   Title Pain decreased 25% with work duties, including sitting for >15 min STG=LTG   Status Achieved     PT SHORT TERM GOAL #3   Title Pt will be able to move on mat table (rolll, transfer) without increased pain using good body mechanics STG=LTG   Status Achieved     PT SHORT TERM GOAL #4   Title Pt will be able to walk up stairs to her apt with no increase in pain, 25% less fatigue, dyspnea. STG=LTG   Status Achieved     PT SHORT TERM GOAL #5   Title Pt will score less than 30% limited on FOTO STG=LTG    Time 4   Period Weeks   Status Unable to assess                  Plan - 02/22/16 1304    Clinical Impression Statement Pain returned to thigh Saturday with working.  No pain at the end of session today.  She has 1 more visit scheduled,  03/02/2016    PT Next Visit Plan  review home exercises ,  D/C?  did not discuss today   PT Home Exercise Plan continue   Consulted and Agree with Plan of Care Patient      Patient will benefit from skilled therapeutic intervention in order to improve the following deficits and impairments:  Increased fascial restricitons, Obesity, Pain, Impaired flexibility, Impaired sensation, Decreased strength, Decreased mobility, Improper body mechanics, Decreased balance  Visit Diagnosis: Pain in left hip  Muscle weakness (generalized)     Problem List Patient Active Problem List   Diagnosis Date Noted  . Frequency of urination 12/15/2015  . Muscle cramps 10/12/2015  . Wheezing 08/04/2015  . Cough 08/04/2015  . Well adult exam 05/12/2015  . Dyslipidemia 10/03/2014  . Allergic rhinitis 04/12/2014  . Mild persistent asthma 04/12/2014  . Dyspnea 04/12/2014  . Left shoulder pain 02/07/2014  . 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 Intertrigo 03/30/2012  . OSA on CPAP 03/30/2012  . GERD (gastroesophageal reflux disease) 04/12/2011  . Abdominal pain, epigastric 05/04/2010  . Unspecified eustachian tube disorder 02/23/2010  . OTITIS MEDIA, LEFT 02/23/2010  . INTERMITTENT VERTIGO 02/23/2010  . NAUSEA 01/18/2010  . LUQ PAIN 01/18/2010  . 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 CHEST PAIN 10/05/2009  . DEPRESSION 09/07/2009  . Obesity 07/17/2009  . Diabetes type 2, uncontrolled (HCC) 04/14/2009  . 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 ELEVATED BP 10/02/2008  . B12 deficiency 08/30/2007  . Vitamin D deficiency 08/30/2007  . HAIR LOSS 08/30/2007  . PARESTHESIA 08/30/2007  . WEIGHT GAIN 08/30/2007  . ANEMIA-IRON DEFICIENCY 05/28/2007    Mitsugi Schrader 02/22/2016, 1:08 PM  [facility] [address] [place], [place], [address] Phone: [phone]   Fax:  [phone]  Name: PALYNN TORAL MRN: [id number] Date of Birth: 1971/06/23

## 2016-02-24 ENCOUNTER — Ambulatory Visit: Payer: 59 | Admitting: Physical Therapy

## 2016-02-24 DIAGNOSIS — M25552 Pain in left hip: Secondary | ICD-10-CM

## 2016-02-24 DIAGNOSIS — M6281 Muscle weakness (generalized): Secondary | ICD-10-CM

## 2016-02-24 NOTE — Therapy (Signed)
[facility] Outpatient Rehabilitation [facility] [address] [place], [place], [address] Phone: [phone]   Fax:  [phone]  Physical Therapy Treatment  Patient Details  Name: Julia Estrada MRN: [id number] Date of Birth: 1971-07-10 Referring Provider: Dr. Lajoyce Corners   Encounter Date: 02/24/2016      PT End of Session - 02/24/16 1612    Visit Number 13   Number of Visits 16   Date for PT Re-Evaluation 03/02/16   PT Start Time 1501   PT Stop Time 1555   PT Time Calculation (min) 54 min   Activity Tolerance Patient tolerated treatment well   Behavior During Therapy [facility] for tasks assessed/performed      Past Medical History:  Diagnosis Date  . Anemia    iron deficiency  . Anxiety   . Depression 2011  . Diabetes mellitus 2010   type II  . GERD (gastroesophageal reflux disease) 2004  . Hyperlipidemia   . Hypertension 2008  . Obesity    BMI=44  . Shortness of breath    with exercise  . Sleep apnea    uses CPAP every night  . Vitamin B12 deficiency   . Vitamin D deficiency     Past Surgical History:  Procedure Laterality Date  . CESAREAN SECTION  2004   x 1  . COLONOSCOPY    . DILITATION & CURRETTAGE/HYSTROSCOPY WITH NOVASURE ABLATION N/A 01/09/2015   Procedure: DILATATION & CURETTAGE/HYSTEROSCOPY WITH NOVASURE ABLATION;  Surgeon: Donovan Kail, MD;  Location: [facility];  Service: Gynecology;  Laterality: N/A;  . fibroidectomy  2002   laparotomy for removal of fibroids  . MYOMECTOMY  2002  . SHOULDER ARTHROSCOPY Left 04/30/2014   Procedure: Left Shoulder Arthroscopy and Debridement;  Surgeon: Nadara Mustard, MD;  Location: [facility] OR;  Service: Orthopedics;  Laterality: Left;  . TONSILLECTOMY AND ADENOIDECTOMY    . UPPER GI ENDOSCOPY    . WISDOM TOOTH EXTRACTION      There were no vitals filed for this visit.          [facility] PT Assessment - 02/24/16 0001      AROM   Left Hip External Rotation  30                     [facility] Adult PT  Treatment/Exercise - 02/24/16 0001      Lumbar Exercises: Stretches   Passive Hamstring Stretch 3 reps;30 seconds   Single Knee to Chest Stretch 3 reps;10 seconds     Lumbar Exercises: Supine   Bent Knee Raise 10 reps  cues to keep abdominals tight   Bridge 10 reps  2 sets     Knee/Hip Exercises: Sidelying   Hip ABduction Limitations 10 X each side  hip uncomfortable with weight bearing     Ultrasound   Ultrasound Location Lt hip   Ultrasound Parameters 100%, 1.5 watts/cm2 X 8 minutes   Ultrasound Goals Pain     Manual Therapy   Manual Therapy Soft tissue mobilization;Myofascial release   Soft tissue mobilization neuromuscular triggerpoint release.  tissue softened   Myofascial Release instrument assist   Passive ROM HIP ER 2 reps 10 seconds                  PT Short Term Goals - 02/22/16 1306      PT SHORT TERM GOAL #1   Title Independent with initial HEP for hip, trunk.   STG = LTG    Status Achieved  PT SHORT TERM GOAL #2   Title Pain decreased 25% with work duties, including sitting for >15 min STG=LTG   Status Achieved     PT SHORT TERM GOAL #3   Title Pt will be able to move on mat table (rolll, transfer) without increased pain using good body mechanics STG=LTG   Status Achieved     PT SHORT TERM GOAL #4   Title Pt will be able to walk up stairs to her apt with no increase in pain, 25% less fatigue, dyspnea. STG=LTG   Status Achieved     PT SHORT TERM GOAL #5   Title Pt will score less than 30% limited on FOTO STG=LTG    Time 4   Period Weeks   Status Unable to assess                  Plan - 02/24/16 1613    Clinical Impression Statement POC ends soon.  Patient to make next appointment with Jenn PAA to discuss,  she is afraid to be discharged.She requested [place] and manual today.  Hip ROM improving  ER 30 degrees pain free.    PT Next Visit Plan Must have an ERO with Victorino Dike.  She plans to call and schedule an appointment tomorrow.   Core , stretching.    PT Home Exercise Plan continue   Consulted and Agree with Plan of Care Patient      Patient will benefit from skilled therapeutic intervention in order to improve the following deficits and impairments:     Visit Diagnosis: Pain in left hip  Muscle weakness (generalized)     Problem List Patient Active Problem List   Diagnosis Date Noted  . Frequency of urination 12/15/2015  . Muscle cramps 10/12/2015  . Wheezing 08/04/2015  . Cough 08/04/2015  . Well adult exam 05/12/2015  . Dyslipidemia 10/03/2014  . Allergic rhinitis 04/12/2014  . Mild persistent asthma 04/12/2014  . Dyspnea 04/12/2014  . Left shoulder pain 02/07/2014  . 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 Intertrigo 03/30/2012  . OSA on CPAP 03/30/2012  . GERD (gastroesophageal reflux disease) 04/12/2011  . Abdominal pain, epigastric 05/04/2010  . Unspecified eustachian tube disorder 02/23/2010  . OTITIS MEDIA, LEFT 02/23/2010  . INTERMITTENT VERTIGO 02/23/2010  . NAUSEA 01/18/2010  . LUQ PAIN 01/18/2010  . 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 CHEST PAIN 10/05/2009  . DEPRESSION 09/07/2009  . Obesity 07/17/2009  . Diabetes type 2, uncontrolled (HCC) 04/14/2009  . 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 ELEVATED BP 10/02/2008  . B12 deficiency 08/30/2007  . Vitamin D deficiency 08/30/2007  . HAIR LOSS 08/30/2007  . PARESTHESIA 08/30/2007  . WEIGHT GAIN 08/30/2007  . [facility] DEFICIENCY 05/28/2007    HARRIS,KAREN 02/24/2016, 4:17 PM  [facility] [address] [place], [place], [address] Phone: [phone]   Fax:  [phone]  Name: Julia Estrada MRN: [id number] Date of Birth: 07-29-1971   Liz Beach, PTA 02/24/16 4:18 PM Phone: [phone] Fax: [phone]

## 2016-03-01 NOTE — Telephone Encounter (Signed)
Instructed billing to resubmit claim for 10/12/15 as corrected with supervising MD as billing provider. Patient informed via MyChart message.

## 2016-03-07 ENCOUNTER — Ambulatory Visit (INDEPENDENT_AMBULATORY_CARE_PROVIDER_SITE_OTHER): Payer: 59 | Admitting: Family Medicine

## 2016-03-07 ENCOUNTER — Encounter: Payer: Self-pay | Admitting: Family Medicine

## 2016-03-07 VITALS — BP 122/80 | HR 80 | Temp 98.5°F | Resp 12 | Ht 64.0 in | Wt 276.4 lb

## 2016-03-07 DIAGNOSIS — R1013 Epigastric pain: Secondary | ICD-10-CM | POA: Diagnosis not present

## 2016-03-07 DIAGNOSIS — K219 Gastro-esophageal reflux disease without esophagitis: Secondary | ICD-10-CM | POA: Diagnosis not present

## 2016-03-07 MED ORDER — ESOMEPRAZOLE MAGNESIUM 40 MG PO CPDR: 40.0000 mg | DELAYED_RELEASE_CAPSULE | Freq: Every day | ORAL | 0 refills | Status: DC

## 2016-03-07 NOTE — Progress Notes (Signed)
HPI:  ACUTE VISIT:  Chief Complaint  Patient presents with  . Abdominal Pain    stomach pain on and off. Pt already on 2 acid reflux medications. The pain was in her shoulders/neck yesterday.    Julia Estrada is a 45 y.o. female, who is here today complaining of 1-2 months of epigastric pressure like pain, initially intermittently but constant for the past week. Pain is sometimes radiated to mid back and last night it was radiated to posterior aspect of neck and shoulders. She denies numbness, tingling, or UE weakness.  It does not seem to be associated with food intake. She denies nausea, vomiting, changes in bowel habits, blood in stool, or melena. Hx of GERD, she is on Protonix 40 mg and Ranitidine.  In general pain has improved, now 3/10, has been 7/10; she is concerned about this been related to her heart, denies chest pain.  She has not identified exacerbating or alleviating factors.  Occasional palpitations for about 2 months, not associated with epigastric pain.  FHx CAD grandfather at age 45. Hx of DM II, HLD, and HTN. She has not checked BS's lately. No tobacco use.  She is exercising regularly and has a Systems analystpersonal trainer, she denies chest pain or palpitations with exertion.   Hx of asthma, no cough or wheezing. Occasionally exertional dyspnea, which improved with Albuterol inhaler. Also on Singulair 10 mg daily.  Normal EGD in 05/2011.  Abdominal CT 06/2014: No acute abdominal or pelvic pathology.  Echo 05/2014 Wall thickness was increased in a pattern of mild LVH. Systolic function was normal, LVEF 55% to 60%.   Wall motion was normal; there were no regional wall motion abnormalities.  Doppler parameters are consistent with abnormal left ventricular relaxation (grade 1 diastolic dysfunction). - Left atrium: The atrium was mildly dilated. - Pulmonary arteries: Systolic pressure was mildly increased  Hx of OSA.   Lab Results  Component Value Date   TSH 0.57 05/05/2015   Lab Results  Component Value Date   CREATININE 0.68 02/01/2016   BUN 11 02/01/2016   NA 134 (L) 02/01/2016   K 4.1 02/01/2016   CL 101 02/01/2016   CO2 25 02/01/2016   CXR due to SOB 08/05/15 No active cardiopulmonary disease. Mild degenerative changes thoracic spine.   Exercise stress test 06/2014: No acute ST/T changes with exercise.  Test ended in Stage II @ 4:00 secondary to dyspnea and hypertensive response to exercise with peak of 243/91 @ peak exercise.   She takes Ibuprofen 800 mg tid as needed for pain, cramps and arthralgias. She has not noted heartburn.   Review of Systems  Constitutional: Negative for activity change, appetite change, fatigue, fever and unexpected weight change.  HENT: Negative for mouth sores, sore throat, trouble swallowing and voice change.   Respiratory: Negative for cough, shortness of breath and wheezing.   Cardiovascular: Positive for palpitations. Negative for chest pain and leg swelling.  Gastrointestinal: Positive for abdominal pain. Negative for abdominal distention, blood in stool, nausea and vomiting.       NO changes in bowel habits.  Genitourinary: Negative for decreased urine volume, dysuria, frequency, hematuria and menstrual problem (LMP a week ago).  Musculoskeletal: Positive for back pain and neck pain. Negative for myalgias.  Skin: Negative for color change and rash.  Neurological: Negative for syncope, weakness and headaches.  Hematological: Negative for adenopathy. Does not bruise/bleed easily.  Psychiatric/Behavioral: Negative for confusion and sleep disturbance. The patient is nervous/anxious.  Current Outpatient Prescriptions on File Prior to Visit  Medication Sig Dispense Refill  . albuterol (VENTOLIN HFA) 108 (90 Base) MCG/ACT inhaler Inhale 2 puffs into the lungs every 6 (six) hours as needed for wheezing or shortness of breath. 3 Inhaler 1  . azelastine (ASTELIN) 0.1 % nasal spray Place 1  spray into both nostrils 2 (two) times daily. Use in each nostril as directed    . cholecalciferol (VITAMIN D) 1000 UNITS tablet Take 1 tablet (1,000 Units total) by mouth daily. 100 tablet 3  . diphenhydrAMINE (BENADRYL) 25 MG tablet Take 1 tablet (25 mg total) by mouth every 6 (six) hours as needed for itching or allergies (hives). 100 tablet 1  . empagliflozin (JARDIANCE) 10 MG TABS tablet Take 25 mg by mouth daily.     . ferrous sulfate 325 (65 FE) MG tablet Take 325 mg by mouth daily with breakfast.    . fexofenadine (ALLEGRA) 180 MG tablet Take 180 mg by mouth daily as needed for allergies.     . fluconazole (DIFLUCAN) 150 MG tablet Take 1 tablet (150 mg total) by mouth once. 1 tablet 1  . fluticasone (FLONASE) 50 MCG/ACT nasal spray Place 2 sprays into both nostrils daily.    Marland Kitchen glucose blood (ONETOUCH VERIO) test strip Use as instructed to check blood sugar twice a day dx code E11.65 200 each 1  . ibuprofen (ADVIL,MOTRIN) 800 MG tablet Take 800 mg by mouth every 8 (eight) hours as needed for fever, headache or mild pain.     Marland Kitchen JARDIANCE 25 MG TABS tablet Take 1 tablet by mouth  daily 90 tablet 1  . metFORMIN (GLUCOPHAGE-XR) 500 MG 24 hr tablet Take 4 tablets by mouth  daily 360 tablet 0  . montelukast (SINGULAIR) 10 MG tablet Take 1 tablet by mouth at  bedtime 90 tablet 1  . olmesartan (BENICAR) 40 MG tablet Take 1 tablet (40 mg total) by mouth daily. 30 tablet 11  . ONETOUCH DELICA LANCETS FINE MISC Use 2 per day dx code E11.65 200 each 1  . PREVIFEM 0.25-35 MG-MCG tablet Take 1 tablet by mouth daily.    . ranitidine (ZANTAC) 300 MG tablet Take 1 tablet by mouth at  bedtime 90 tablet 3  . simvastatin (ZOCOR) 20 MG tablet Take 1 tablet by mouth at  bedtime 90 tablet 0  . TANZEUM 50 MG PEN Inject the contents of one  pen once per week 12 each 2  . triamcinolone cream (KENALOG) 0.5 % Apply 1 application topically 3 (three) times daily. On rash 90 g 1  . valACYclovir (VALTREX) 1000 MG tablet  Take 1,000 mg by mouth daily.      No current facility-administered medications on file prior to visit.      Past Medical History:  Diagnosis Date  . Anemia    iron deficiency  . Anxiety   . Depression 2011  . Diabetes mellitus 2010   type II  . GERD (gastroesophageal reflux disease) 2004  . Hyperlipidemia   . Hypertension 2008  . Obesity    BMI=44  . Shortness of breath    with exercise  . Sleep apnea    uses CPAP every night  . Vitamin B12 deficiency   . Vitamin D deficiency    Allergies  Allergen Reactions  . Codeine Sulfate Nausea And Vomiting  . Hydrochlorothiazide W-Triamterene     REACTION: cramping  . Oxycodone-Acetaminophen Nausea And Vomiting  . Prednisone Hives  . Spironolactone  Cramps   . Victoza [Liraglutide]     n/v    Social History   Social History  . Marital status: Divorced    Spouse name: N/A  . Number of children: 1  . 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 Smoking status: Never Smoker  . Smokeless tobacco: Never Used  . Alcohol use No     Comment: occasional  . Drug use: No  . Sexual activity: Yes    Birth control/ protection: Pill   Other Topics Concern  . None   Social History Narrative  . None    Vitals:   03/07/16 1326  BP: 122/80  Pulse: 80  Resp: 12  Temp: 98.5 F (36.9 C)   Body mass index is 47.44 kg/m.  O2 sat at RA 97%    Physical Exam  Nursing note and vitals reviewed. Constitutional: She is oriented to person, place, and time. She appears well-developed. She does not appear ill. No distress.  HENT:  Head: Atraumatic.  Mouth/Throat: Oropharynx is clear and moist and mucous membranes are normal.  Eyes: Conjunctivae are normal.  Cardiovascular: Normal rate and regular rhythm.   No murmur heard. Respiratory: Effort normal and breath sounds normal. No respiratory distress. She exhibits no tenderness.  GI: Soft. She exhibits no mass.  There is tenderness (mild) in the epigastric area. There is no rigidity, no rebound and no guarding.  Musculoskeletal: She exhibits no edema.  Lymphadenopathy:    She has no cervical adenopathy.  Neurological: She is alert and oriented to person, place, and time. She has normal strength. Coordination and gait normal.  Skin: Skin is warm. No erythema.  Psychiatric: Her mood appears anxious.  Well groomed, good eye contact.      ASSESSMENT AND PLAN:     Tacoya was seen today for abdominal pain.  Diagnoses and all orders for this visit:  Abdominal pain, epigastric -     EKG 12-Lead  Gastroesophageal reflux disease without esophagitis -     esomeprazole (NEXIUM) 40 MG capsule; Take 1 capsule (40 mg total) by mouth daily.   Possible causes of epigastric pain discussed: GERD, gall bladder disease, dyspepsia among some.   EKG today sinus arrhythmia, normal intervals and axis, no signs of ischemia. No major changes with EKGs done in August 23/2015 and September 2014. EKG result discussed with patient, explained that epigastric pain she is having does not seem to be cardia. She has a few risk factor for CAD, so clearly instructed about warning signs. Recommend changing PPI from Protonix to Nexium. GERD precautions also discussed. She agrees with plan.   F/U oin 2-3 weeks with PCP, before if needed.    -Ms.Shauna Hugh advised to return or notify a doctor immediately if symptoms worsen or persist or new concerns arise.       Betty G. [place], MD  [facility]. [place] office.

## 2016-03-07 NOTE — Patient Instructions (Signed)
   [name] I have seen you today for an acute visit because your primary care provider was not available. Monitor for signs of worsening symptoms and seek immediate medical attention if any concerning/warning symptom as we discussed.  Please arrange for a follow up appointment with your doctor in 2-3 weeks, before if symptoms get worse.      A few things to remember from today's visit:   Chest discomfort - Plan: EKG 12-Lead  Abdominal pain, epigastric  Gastroesophageal reflux disease without esophagitis    GET HELP RIGHT AWAY IF:   The pain does not go away within 2 hours.  Sudden severe/worsening pain.  You keep throwing up (vomiting).  The pain changes and is only in the right or left part of the belly.  Not being able to pass gas or poop.  You have bloody or tarry looking poop.   MAKE SURE YOU:   Understand these instructions.  Will watch your condition.  Will get help right away if you are not doing well or get worse.   Motrin can exacerbate GERD and acid reflux, so recommended stopping it.   Avoid foods that make your symptoms worse, for example coffee, chocolate,pepermeint,alcohol, and greasy food. Raising the head of your bed about 6 inches may help with nocturnal symptoms.  Avoid tobacco use. Weight loss (if you are overweight). Avoid lying down for 3 hours after eating.  Instead 3 large meals daily try small and more frequent meals during the day.  Some medications we recommend for acid reflux treatment (proton pump inhibitors) can cause some problems in the long term: increase risk of osteoporosis, vitamin deficiencies,pneumonia, and more recently discovered that it can increase the risk of chronic kidney disease and might increase risk of dementia.  You should be evaluated immediately if bloody vomiting, bloody stools, black stools (like tar), difficulty swallowing, food gets stuck on the way down or choking when eating. Abnormal weight  loss or severe abdominal pain.    Please be sure medication list is accurate. If a new problem present, please set up appointment sooner than planned today.

## 2016-03-21 ENCOUNTER — Telehealth: Payer: Self-pay | Admitting: Endocrinology

## 2016-03-21 NOTE — Telephone Encounter (Signed)
Pt asking for yeast infection cream she has one under her breast

## 2016-03-24 NOTE — Telephone Encounter (Signed)
Prescription has been called to the pharmacy and patient has been advised.

## 2016-03-24 NOTE — Telephone Encounter (Signed)
Nystatin cream, 15 g, use twice a day

## 2016-03-29 ENCOUNTER — Ambulatory Visit: Payer: 59 | Attending: Orthopedic Surgery | Admitting: Physical Therapy

## 2016-03-29 DIAGNOSIS — M25552 Pain in left hip: Secondary | ICD-10-CM | POA: Insufficient documentation

## 2016-03-29 DIAGNOSIS — M6281 Muscle weakness (generalized): Secondary | ICD-10-CM

## 2016-03-29 NOTE — Therapy (Signed)
[place], [place], [address] Phone: [phone]   Fax:  [phone]  Physical Therapy Treatment/Renewal   Patient Details  Name: Julia Estrada MRN: [id number] Date of Birth: 02-Jan-1971 Referring Provider: Dr. Sharol Given   Encounter Date: 03/29/2016      PT End of Session - 03/29/16 0841    Visit Number 14   Number of Visits 22   Date for PT Re-Evaluation 04/29/16   PT Start Time 0803   PT Stop Time 0849   PT Time Calculation (min) 46 min   Activity Tolerance Patient tolerated treatment well   Behavior During Therapy [facility] for tasks assessed/performed      Past Medical History:  Diagnosis Date  . Anemia    iron deficiency  . Anxiety   . Depression 2011  . Diabetes mellitus 2010   type II  . GERD (gastroesophageal reflux disease) 2004  . Hyperlipidemia   . Hypertension 2008  . Obesity    BMI=44  . Shortness of breath    with exercise  . Sleep apnea    uses CPAP every night  . Vitamin B12 deficiency   . Vitamin D deficiency     Past Surgical History:  Procedure Laterality Date  . CESAREAN SECTION  2004   x 1  . COLONOSCOPY    . [place] N/A 01/09/2015   Procedure: DILATATION & CURETTAGE/HYSTEROSCOPY WITH NOVASURE ABLATION;  Surgeon: Harle Battiest, MD;  Location: [facility];  Service: Gynecology;  Laterality: N/A;  . fibroidectomy  2002   laparotomy for removal of fibroids  . MYOMECTOMY  2002  . SHOULDER ARTHROSCOPY Left 04/30/2014   Procedure: Left Shoulder Arthroscopy and Debridement;  Surgeon: Newt Minion, MD;  Location: [place];  Service: Orthopedics;  Laterality: Left;  . TONSILLECTOMY AND ADENOIDECTOMY    . UPPER GI ENDOSCOPY    . WISDOM TOOTH EXTRACTION      There were no vitals filed for this visit.      Subjective Assessment - 03/29/16 0806    Subjective Patient returns with increase in pain over the 2 weeks.  She was feeling better and  couldnt get appts.  She cont to walk and do [facility] exercises, including yoga. Sypmtoms are more lateral in hip.  She feels she may be doing the wrong things.      How long can you sit comfortably? still depends on the day, L hip hurting pain increases with sitting   How long can you stand comfortably? sometimes only 15 min with static standing    How long can you walk comfortably? 15 min is ok, up to 30 min    Patient Stated Goals Patient wants to be able to hurt at all!   Currently in Pain? Yes   Pain Score 5    Pain Location Hip   Pain Orientation Left   Pain Descriptors / Indicators Aching;Sore   Pain Type Chronic pain   Pain Radiating Towards prox ant thigh    Pain Onset More than a month ago   Pain Frequency Intermittent            [facility] PT Assessment - 03/29/16 0817      AROM   Lumbar Flexion 70   Lumbar Extension 20 leg pain    Lumbar - Right Side Bend WNL   Lumbar - Left Side Bend 50% with leg pain    Lumbar - Right Rotation WNL  Lumbar - Left Rotation WNL      PROM   Overall PROM  --  pain with L IR    Left Hip Internal Rotation  --  pain      Strength   Right Hip Flexion 4/5   Right Hip ABduction 4+/5   Left Hip Flexion 4/5   Left Hip ABduction 4+/5   Right Knee Flexion 5/5   Right Knee Extension 5/5   Left Knee Flexion 5/5   Left Knee Extension 5/5     Palpation   Palpation comment sore lateral L SI border , pain at Gr Trochanter and distally.     Special Tests   Hip Special Tests  Hip Scouring     Hip Scouring   Findings Positive   Side Left   Comments traction felt better                     [facility] Adult PT Treatment/Exercise - 03/29/16 0829      Lumbar Exercises: Sidelying   Clam 20 reps   Clam Limitations more difficulty on L weakness     Knee/Hip Exercises: Stretches   ITB Stretch Left;2 reps;30 seconds   Piriformis Stretch Both;2 reps;30 seconds   Piriformis Stretch Limitations used towel under thigh    Other  Knee/Hip Stretches knee over knee for lateral hip and back stretch  30 sec       cold pack lateral L hip 10 min           PT Education - 03/29/16 1306    Education provided Yes   Education Details renewal, hip   Person(s) Educated Patient   Methods Explanation   Comprehension Verbalized understanding          PT Short Term Goals - 03/29/16 0849      PT SHORT TERM GOAL #1   Title Independent with initial HEP for hip, trunk.     Status New     PT SHORT TERM GOAL #2   Title Pain decreased 25% with work duties, including sitting for >15 min STG=LTG   Status New     PT SHORT TERM GOAL #3   Title Pt will be able to move on mat table (rolll, transfer) without increased pain using good body mechanics STG=LTG   Status New     PT SHORT TERM GOAL #4   Title Pt will be able to walk up stairs to her apt with no increase in pain, 25% less fatigue, dyspnea. STG=LTG   Status On-going     PT SHORT TERM GOAL #5   Title Pt will score less than 30% limited on FOTO STG=LTG    Status Unable to assess                  Plan - 03/29/16 1306    Clinical Impression Statement Patient did not call back, called her for visit inactivity and she wanted to return to PT.  Goals were met, not not met.  Source of pain seemed more from the hip today. She was asked to hold off on personal training sessions and return to basci HEP, cont walking, make appt with Dr. Sharol Given.  She is open to dry needling.    PT Next Visit Plan ROM, strength all planes.  modalities.  core.    PT Home Exercise Plan reissued    Consulted and Agree with Plan of Care Patient      Patient will benefit from skilled therapeutic  intervention in order to improve the following deficits and impairments:  Increased fascial restricitons, Obesity, Pain, Impaired flexibility, Impaired sensation, Decreased strength, Decreased mobility, Improper body mechanics, Decreased balance  Visit Diagnosis: Pain in left hip  Muscle  weakness (generalized)     Problem List Patient Active Problem List   Diagnosis Date Noted  . Frequency of urination 12/15/2015  . Muscle cramps 10/12/2015  . Wheezing 08/04/2015  . Cough 08/04/2015  . Well adult exam 05/12/2015  . Dyslipidemia 10/03/2014  . Allergic rhinitis 04/12/2014  . Mild persistent asthma 04/12/2014  . Dyspnea 04/12/2014  . Left shoulder pain 02/07/2014  . 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 Intertrigo 03/30/2012  . OSA on CPAP 03/30/2012  . GERD (gastroesophageal reflux disease) 04/12/2011  . Abdominal pain, epigastric 05/04/2010  . Unspecified eustachian tube disorder 02/23/2010  . OTITIS MEDIA, LEFT 02/23/2010  . INTERMITTENT VERTIGO 02/23/2010  . NAUSEA 01/18/2010  . LUQ PAIN 01/18/2010  . 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 CHEST PAIN 10/05/2009  . DEPRESSION 09/07/2009  . Obesity 07/17/2009  . Diabetes type 2, uncontrolled ([place]) 04/14/2009  . 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 ELEVATED BP 10/02/2008  . B12 deficiency 08/30/2007  . Vitamin D deficiency 08/30/2007  . HAIR LOSS 08/30/2007  . PARESTHESIA 08/30/2007  . WEIGHT GAIN 08/30/2007  . [facility] DEFICIENCY 05/28/2007    Akirra Lacerda 03/29/2016, 1:11 PM  [facility] [address] [place], [place], [address] Phone: [phone]   Fax:  [phone]  Name: Julia Estrada MRN: [id number] Date of Birth: 1971-06-26

## 2016-03-29 NOTE — Patient Instructions (Signed)
Hamstring Stretch, Reclined (Strap, Doorframe)    Lengthen bottom leg on floor. Extend top leg along edge of doorframe or press foot up into yoga strap. Hold for __3__ breaths. Repeat __2-3__ times each leg.  Copyright  VHI. All rights reserved.    Piriformis Stretch    Lying on back, pull right knee toward opposite shoulder. Hold _30___ seconds. Repeat _2-3___ times. Do __2__ sessions per day.  [email or web address].[email or web address]   Copyright  VHI. All rights reserved.

## 2016-04-11 ENCOUNTER — Ambulatory Visit: Payer: 59 | Attending: Orthopedic Surgery | Admitting: Physical Therapy

## 2016-04-11 DIAGNOSIS — M25612 Stiffness of left shoulder, not elsewhere classified: Secondary | ICD-10-CM | POA: Insufficient documentation

## 2016-04-11 DIAGNOSIS — R531 Weakness: Secondary | ICD-10-CM | POA: Diagnosis present

## 2016-04-11 DIAGNOSIS — M25512 Pain in left shoulder: Secondary | ICD-10-CM | POA: Insufficient documentation

## 2016-04-11 DIAGNOSIS — M25552 Pain in left hip: Secondary | ICD-10-CM | POA: Diagnosis not present

## 2016-04-11 DIAGNOSIS — M6281 Muscle weakness (generalized): Secondary | ICD-10-CM | POA: Diagnosis present

## 2016-04-11 NOTE — Therapy (Signed)
[facility] [address] [place], [place], [address] Phone: [phone]   Fax:  [phone]  Physical Therapy Treatment  Patient Details  Name: Julia Estrada MRN: [id number] Date of Birth: February 23, 1971 Referring Provider: Dr. Lajoyce Corners   Encounter Date: 04/11/2016      PT End of Session - 04/11/16 1142    Visit Number 15   Number of Visits 22   Date for PT Re-Evaluation 04/29/16   PT Start Time 1100   PT Stop Time 1153   PT Time Calculation (min) 53 min   Activity Tolerance Patient tolerated treatment well   Behavior During Therapy [facility] for tasks assessed/performed      Past Medical History:  Diagnosis Date  . Anemia    iron deficiency  . Anxiety   . Depression 2011  . Diabetes mellitus 2010   type II  . GERD (gastroesophageal reflux disease) 2004  . Hyperlipidemia   . Hypertension 2008  . Obesity    BMI=44  . Shortness of breath    with exercise  . Sleep apnea    uses CPAP every night  . Vitamin B12 deficiency   . Vitamin D deficiency     Past Surgical History:  Procedure Laterality Date  . CESAREAN SECTION  2004   x 1  . COLONOSCOPY    . DILITATION & CURRETTAGE/HYSTROSCOPY WITH NOVASURE ABLATION N/A 01/09/2015   Procedure: DILATATION & CURETTAGE/HYSTEROSCOPY WITH NOVASURE ABLATION;  Surgeon: Donovan Kail, MD;  Location: [facility];  Service: Gynecology;  Laterality: N/A;  . fibroidectomy  2002   laparotomy for removal of fibroids  . MYOMECTOMY  2002  . SHOULDER ARTHROSCOPY Left 04/30/2014   Procedure: Left Shoulder Arthroscopy and Debridement;  Surgeon: Nadara Mustard, MD;  Location: [facility] OR;  Service: Orthopedics;  Laterality: Left;  . TONSILLECTOMY AND ADENOIDECTOMY    . UPPER GI ENDOSCOPY    . WISDOM TOOTH EXTRACTION      There were no vitals filed for this visit.      Subjective Assessment - 04/11/16 1107    Subjective "it has fluctuated, went boweling and I was sore the were I could really put pressure on my L  hip"    Currently in Pain? Yes   Pain Score 2    Pain Orientation Left   Pain Type Chronic pain   Pain Onset More than a month ago   Pain Frequency Intermittent   Aggravating Factors  standing up,    Pain Relieving Factors PT,                          [facility] Adult PT Treatment/Exercise - 04/11/16 0001      Lumbar Exercises: Stretches   Piriformis Stretch 2 reps;30 seconds     Moist Heat Therapy   Number Minutes Moist Heat 10 Minutes   Moist Heat Location Hip  L hip in R sidelying     Manual Therapy   Soft tissue mobilization IASTM along L glute medius/ minimus and piriformis   Myofascial Release fasical stretching / rolling          Trigger Point Dry Needling - 04/11/16 1128    Consent Given? Yes   Education Handout Provided Yes   Muscles Treated Lower Body Gluteus minimus;Gluteus maximus;Piriformis   Gluteus Minimus Response Twitch response elicited;Palpable increased muscle length  x2 with pistoning and twisting, glute medius   Piriformis Response Twitch response elicited;Palpable increased muscle length  x  2 with pistoning and twisting              PT Education - 04/11/16 1141    Education provided Yes   Education Details anatomy in regard to formation of trigger points benefits and effects of dry needling, what to expect, and after care   Person(s) Educated Patient   Methods Explanation;Verbal cues   Comprehension Verbalized understanding;Returned demonstration;Verbal cues required          PT Short Term Goals - 03/29/16 0849      PT SHORT TERM GOAL #1   Title Independent with initial HEP for hip, trunk.     Status New     PT SHORT TERM GOAL #2   Title Pain decreased 25% with work duties, including sitting for >15 min STG=LTG   Status New     PT SHORT TERM GOAL #3   Title Pt will be able to move on mat table (rolll, transfer) without increased pain using good body mechanics STG=LTG   Status New     PT SHORT TERM GOAL #4    Title Pt will be able to walk up stairs to her apt with no increase in pain, 25% less fatigue, dyspnea. STG=LTG   Status On-going     PT SHORT TERM GOAL #5   Title Pt will score less than 30% limited on FOTO STG=LTG    Status Unable to assess                  Plan - 04/11/16 1142    Clinical Impression Statement Julia Estrada reports she is doing better today with report of 2/10 pain. TPDN was perfrom on L glute med/ min and piriformis. pt reported reporduciton of symptoms which with glute medius DN which calmed down throughout treatment. Following soft tissue work and MHP pt reported pain droped to a 1/10.    PT Treatment/Interventions Ultrasound;Traction;Balance training;Neuromuscular re-education;Patient/family education;Cryotherapy;Moist Heat;Therapeutic exercise;Manual techniques;Taping;Therapeutic activities;Electrical Stimulation;Dry needling   PT Next Visit Plan assess response to DN, strenth in all planes, modalities, core    Consulted and Agree with Plan of Care Patient      Patient will benefit from skilled therapeutic intervention in order to improve the following deficits and impairments:  Increased fascial restricitons, Obesity, Pain, Impaired flexibility, Impaired sensation, Decreased strength, Decreased mobility, Improper body mechanics, Decreased balance  Visit Diagnosis: Pain in left hip  Muscle weakness (generalized)     Problem List Patient Active Problem List   Diagnosis Date Noted  . Frequency of urination 12/15/2015  . Muscle cramps 10/12/2015  . Wheezing 08/04/2015  . Cough 08/04/2015  . Well adult exam 05/12/2015  . Dyslipidemia 10/03/2014  . Allergic rhinitis 04/12/2014  . Mild persistent asthma 04/12/2014  . Dyspnea 04/12/2014  . Left shoulder pain 02/07/2014  . 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 Intertrigo 03/30/2012  . OSA on CPAP 03/30/2012  . GERD (gastroesophageal reflux disease) 04/12/2011  . Abdominal pain, epigastric 05/04/2010   . Unspecified eustachian tube disorder 02/23/2010  . OTITIS MEDIA, LEFT 02/23/2010  . INTERMITTENT VERTIGO 02/23/2010  . NAUSEA 01/18/2010  . LUQ PAIN 01/18/2010  . 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 CHEST PAIN 10/05/2009  . DEPRESSION 09/07/2009  . Obesity 07/17/2009  . Diabetes type 2, uncontrolled (HCC) 04/14/2009  . 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 ELEVATED BP 10/02/2008  . B12 deficiency 08/30/2007  . Vitamin D deficiency 08/30/2007  . HAIR LOSS 08/30/2007  . PARESTHESIA 08/30/2007  . WEIGHT GAIN 08/30/2007  . ANEMIA-IRON DEFICIENCY 05/28/2007  Lulu Riding PT, DPT, LAT, ATC  04/11/16  11:46 AM      [facility] [address] [place], [place], [address] Phone: [phone]   Fax:  [phone]  Name: Julia Estrada MRN: [id number] Date of Birth: Apr 02, 1971

## 2016-04-12 NOTE — Telephone Encounter (Signed)
DOS 10/12/15 [facility] ( needed billing physician added). Left message for patient to confirm she has no balance from this visit.

## 2016-04-13 ENCOUNTER — Ambulatory Visit: Payer: 59 | Admitting: Physical Therapy

## 2016-04-13 DIAGNOSIS — M25552 Pain in left hip: Secondary | ICD-10-CM

## 2016-04-13 DIAGNOSIS — M6281 Muscle weakness (generalized): Secondary | ICD-10-CM

## 2016-04-13 NOTE — Therapy (Signed)
[facility] [address] [place], [place], [address] Phone: [phone]   Fax:  [phone]  Physical Therapy Treatment  Patient Details  Name: Julia Estrada MRN: [id number] Date of Birth: 04/04/1971 Referring Provider: Dr. Lajoyce Cornersuda   Encounter Date: 04/13/2016      PT End of Session - 04/13/16 1415    Visit Number 16   Number of Visits 22   Date for PT Re-Evaluation 04/29/16   PT Start Time 1330   PT Stop Time 1420   PT Time Calculation (min) 50 min   Activity Tolerance Patient tolerated treatment well   Behavior [facility] for tasks assessed/performed      Past Medical History:  Diagnosis Date  . Anemia    iron deficiency  . Anxiety   . Depression 2011  . Diabetes mellitus 2010   type II  . GERD (gastroesophageal reflux disease) 2004  . Hyperlipidemia   . Hypertension 2008  . Obesity    BMI=44  . Shortness of breath    with exercise  . Sleep apnea    uses CPAP every night  . Vitamin B12 deficiency   . Vitamin D deficiency     Past Surgical History:  Procedure Laterality Date  . CESAREAN SECTION  2004   x 1  . COLONOSCOPY    . DILITATION & CURRETTAGE/HYSTROSCOPY WITH NOVASURE ABLATION N/A 01/09/2015   Procedure: DILATATION & CURETTAGE/HYSTEROSCOPY WITH NOVASURE ABLATION;  Surgeon: Donovan KailAllen Ross, MD;  Location: [facility];  Service: Gynecology;  Laterality: N/A;  . fibroidectomy  2002   laparotomy for removal of fibroids  . MYOMECTOMY  2002  . SHOULDER ARTHROSCOPY Left 04/30/2014   Procedure: Left Shoulder Arthroscopy and Debridement;  Surgeon: Nadara MustardMarcus Duda V, MD;  [facility];  Service: Orthopedics;  Laterality: Left;  . TONSILLECTOMY AND ADENOIDECTOMY    . UPPER GI ENDOSCOPY    . WISDOM TOOTH EXTRACTION      There were no vitals filed for this visit.      Subjective Assessment - 04/13/16 1330    Subjective "still having soreness in the glute since last session"   Currently in Pain? Yes   Pain  Score 3    Pain Location Hip   Pain Orientation Left   Pain Descriptors / Indicators Sore   Pain Type Chronic pain   Pain Onset More than a month ago   Pain Frequency Intermittent                         [facility] Adult PT Treatment/Exercise - 04/13/16 0001      Lumbar Exercises: Stretches   Single Knee to Chest Stretch 3 reps;30 seconds  2 x 30 sidelying glute med stretch 2 x 30 sec     Lumbar Exercises: Supine   Bent Knee Raise 15 reps;2 seconds   Straight Leg Raise 10 reps     Knee/Hip Exercises: Sidelying   Hip ABduction Limitations 10 X each side x 2 sets  cues to avoid rotating hips backward     Moist Heat Therapy   Number Minutes Moist Heat 10 Minutes   Moist Heat Location Hip     Ultrasound   Ultrasound Location L lateral hip    Ultrasound Parameters 100%, 1.5 w/cm2 x 8 min   Ultrasound Goals Pain     Manual Therapy   Soft tissue mobilization IASTM along L glute medius/ minimus and piriformis   Myofascial Release fasical stretching /  rolling          Trigger Point Dry Needling - 04/13/16 1344    Consent Given? Yes   Education Handout Provided Yes  given previously   Muscles Treated Lower Body Gluteus minimus;Gluteus maximus   Gluteus Minimus Response Twitch response elicited;Palpable increased muscle length  x 5 with pistoning/ twisting proximal to the greater trochan                PT Short Term Goals - 03/29/16 0849      PT SHORT TERM GOAL #1   Title Independent with initial HEP for hip, trunk.     Status New     PT SHORT TERM GOAL #2   Title Pain decreased 25% with work duties, including sitting for >15 min STG=LTG   Status New     PT SHORT TERM GOAL #3   Title Pt will be able to move on mat table (rolll, transfer) without increased pain using good body mechanics STG=LTG   Status New     PT SHORT TERM GOAL #4   Title Pt will be able to walk up stairs to her apt with no increase in pain, 25% less fatigue, dyspnea. STG=LTG    Status On-going     PT SHORT TERM GOAL #5   Title Pt will score less than 30% limited on FOTO STG=LTG    Status Unable to assess                  Plan - 04/13/16 1415    Clinical Impression Statement Julia Estrada continues to report soreness from the DN but states she has decreased tightness in the L hip. DN was performed in proximal to the Greater trochanter which was in a different location than last session. Follow soft tissue work and stretch/ [place] pt reported decreased pain and tightness and was able to exercises post session. utilized MHp post session to calm down pain from DN post session .    PT Next Visit Plan assess response to DN, strenth in all planes, modalities, core    PT Home Exercise Plan reviewed previous HEP   Consulted and Agree with Plan of Care Patient      Patient will benefit from skilled therapeutic intervention in order to improve the following deficits and impairments:  Increased fascial restricitons, Obesity, Pain, Impaired flexibility, Impaired sensation, Decreased strength, Decreased mobility, Improper body mechanics, Decreased balance  Visit Diagnosis: Pain in left hip  Muscle weakness (generalized)     Problem List Patient Active Problem List   Diagnosis Date Noted  . Frequency of urination 12/15/2015  . Muscle cramps 10/12/2015  . Wheezing 08/04/2015  . Cough 08/04/2015  . Well adult exam 05/12/2015  . Dyslipidemia 10/03/2014  . Allergic rhinitis 04/12/2014  . Mild persistent asthma 04/12/2014  . Dyspnea 04/12/2014  . Left shoulder pain 02/07/2014  . 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 Intertrigo 03/30/2012  . OSA on CPAP 03/30/2012  . GERD (gastroesophageal reflux disease) 04/12/2011  . Abdominal pain, epigastric 05/04/2010  . Unspecified eustachian tube disorder 02/23/2010  . OTITIS MEDIA, LEFT 02/23/2010  . INTERMITTENT VERTIGO 02/23/2010  . NAUSEA 01/18/2010  . LUQ PAIN 01/18/2010  . 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  CHEST PAIN 10/05/2009  . DEPRESSION 09/07/2009  . Obesity 07/17/2009  . Diabetes type 2, uncontrolled (HCC) 04/14/2009  . 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 ELEVATED BP 10/02/2008  . B12 deficiency 08/30/2007  . Vitamin D deficiency 08/30/2007  . HAIR LOSS 08/30/2007  .  PARESTHESIA 08/30/2007  . WEIGHT GAIN 08/30/2007  . [facility] DEFICIENCY 05/28/2007   Lulu Riding PT, DPT, LAT, ATC  04/13/16  2:19 PM      [facility] [address] [place], [place], [address] Phone: [phone]   Fax:  [phone]  Name: Julia Estrada MRN: [id number] Date of Birth: 1970-11-04

## 2016-04-18 ENCOUNTER — Ambulatory Visit: Payer: 59 | Admitting: Physical Therapy

## 2016-04-18 DIAGNOSIS — M6281 Muscle weakness (generalized): Secondary | ICD-10-CM

## 2016-04-18 DIAGNOSIS — M25552 Pain in left hip: Secondary | ICD-10-CM

## 2016-04-18 NOTE — Therapy (Signed)
[place], [place], [address] Phone: [phone]   Fax:  [phone]  Physical Therapy Treatment  Patient Details  Name: Julia Estrada MRN: [id number] Date of Birth: Aug 16, 1970 Referring Provider: Dr. Sharol Given   Encounter Date: 04/18/2016      PT End of Session - 04/18/16 1334    Visit Number 17   Number of Visits 22   Date for PT Re-Evaluation 04/29/16   PT Start Time 1332   PT Stop Time 1420   PT Time Calculation (min) 48 min   Activity Tolerance Patient tolerated treatment well   Behavior During Therapy [facility] for tasks assessed/performed      Past Medical History:  Diagnosis Date  . Anemia    iron deficiency  . Anxiety   . Depression 2011  . Diabetes mellitus 2010   type II  . GERD (gastroesophageal reflux disease) 2004  . Hyperlipidemia   . Hypertension 2008  . Obesity    BMI=44  . Shortness of breath    with exercise  . Sleep apnea    uses CPAP every night  . Vitamin B12 deficiency   . Vitamin D deficiency     Past Surgical History:  Procedure Laterality Date  . CESAREAN SECTION  2004   x 1  . COLONOSCOPY    . [place] N/A 01/09/2015   Procedure: DILATATION & CURETTAGE/HYSTEROSCOPY WITH NOVASURE ABLATION;  Surgeon: Harle Battiest, MD;  Location: [facility];  Service: Gynecology;  Laterality: N/A;  . fibroidectomy  2002   laparotomy for removal of fibroids  . MYOMECTOMY  2002  . SHOULDER ARTHROSCOPY Left 04/30/2014   Procedure: Left Shoulder Arthroscopy and Debridement;  Surgeon: Newt Minion, MD;  Location: [facility];  Service: Orthopedics;  Laterality: Left;  . TONSILLECTOMY AND ADENOIDECTOMY    . UPPER GI ENDOSCOPY    . WISDOM TOOTH EXTRACTION      There were no vitals filed for this visit.      Subjective Assessment - 04/18/16 1338    Subjective L prox hip pain 2/10.  Not sore anymore.  Stretching and massage helps the most.     Currently in Pain? Yes   Pain Score 2    Pain Location Hip   Pain Orientation Left   Pain Descriptors / Indicators Tightness   Pain Type Chronic pain   Pain Onset More than a month ago   Pain Frequency Intermittent                         [facility] Adult PT Treatment/Exercise - 04/18/16 1358      Lumbar Exercises: Stretches   Active Hamstring Stretch 2 reps;30 seconds   Lower Trunk Rotation 5 reps;10 seconds   ITB Stretch 2 reps;30 seconds   Piriformis Stretch 2 reps;30 seconds     Lumbar Exercises: Supine   Bridge 10 reps   Bridge Limitations 2 sets, adduction with ball, abd with pilates ring      Manual Therapy   Manual Therapy Manual Traction   Soft tissue mobilization Lateral SI border on L side, L piriformis    Myofascial Release L hip in sidelying   surrounding gr Trochanter with biofreeze   Manual Traction long axis in supine , used sidelying to                 PT Education - 04/18/16 1538    Education provided Yes  Education Details stretching   Person(s) Educated Patient   Methods Explanation   Comprehension Verbalized understanding          PT Short Term Goals - 04/18/16 1339      PT SHORT TERM GOAL #1   Title Independent with initial HEP for hip, trunk.     Status Achieved     PT SHORT TERM GOAL #2   Title Pain decreased 25% with work duties, including sitting for >15 min    Status Achieved     PT SHORT TERM GOAL #3   Title Pt will be able to move on mat table (rolll, transfer) without increased pain using good body mechanics STG=LTG   Status Achieved     PT SHORT TERM GOAL #4   Title Pt will be able to walk up stairs to her apt with no increase in pain, 25% less fatigue, dyspnea. STG=LTG   Baseline hurts sometimes    Status Partially Met     PT SHORT TERM GOAL #5   Title Pt will score less than 30% limited on FOTO     Status Unable to assess                  Plan - 04/18/16 1539    Clinical Impression  Statement Pain is localized to Gr. Trochanter and into L hip rotators.  She has lost 10 lbs in a weight loss contest at work.  She responds well to manual and stretching, used Biofreeze post.    PT Next Visit Plan assess response to DN, strenth in all planes, modalities, core    PT Home Exercise Plan reviewed previous HEP   Consulted and Agree with Plan of Care Patient      Patient will benefit from skilled therapeutic intervention in order to improve the following deficits and impairments:  Increased fascial restricitons, Obesity, Pain, Impaired flexibility, Impaired sensation, Decreased strength, Decreased mobility, Improper body mechanics, Decreased balance  Visit Diagnosis: Pain in left hip  Muscle weakness (generalized)     Problem List Patient Active Problem List   Diagnosis Date Noted  . Frequency of urination 12/15/2015  . Muscle cramps 10/12/2015  . Wheezing 08/04/2015  . Cough 08/04/2015  . Well adult exam 05/12/2015  . Dyslipidemia 10/03/2014  . Allergic rhinitis 04/12/2014  . Mild persistent asthma 04/12/2014  . Dyspnea 04/12/2014  . Left shoulder pain 02/07/2014  . 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 Intertrigo 03/30/2012  . OSA on CPAP 03/30/2012  . GERD (gastroesophageal reflux disease) 04/12/2011  . Abdominal pain, epigastric 05/04/2010  . Unspecified eustachian tube disorder 02/23/2010  . OTITIS MEDIA, LEFT 02/23/2010  . INTERMITTENT VERTIGO 02/23/2010  . NAUSEA 01/18/2010  . LUQ PAIN 01/18/2010  . 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 CHEST PAIN 10/05/2009  . DEPRESSION 09/07/2009  . Obesity 07/17/2009  . Diabetes type 2, uncontrolled ([place]) 04/14/2009  . 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 ELEVATED BP 10/02/2008  . B12 deficiency 08/30/2007  . Vitamin D deficiency 08/30/2007  . HAIR LOSS 08/30/2007  . PARESTHESIA 08/30/2007  . WEIGHT GAIN 08/30/2007  . [facility] DEFICIENCY 05/28/2007    Lori Popowski 04/18/2016, 3:41 PM  [facility] [address] [place], [place], [address] Phone: [phone]   Fax:  [phone]  Name: EMBERLEY KRAL MRN: [id number] Date of Birth: Jan 26, 1971  Raeford Razor, PT 04/18/16 3:42 PM Phone: [phone] Fax: [phone]

## 2016-04-20 ENCOUNTER — Ambulatory Visit: Payer: 59 | Admitting: Physical Therapy

## 2016-04-20 DIAGNOSIS — M6281 Muscle weakness (generalized): Secondary | ICD-10-CM

## 2016-04-20 DIAGNOSIS — M25552 Pain in left hip: Secondary | ICD-10-CM | POA: Diagnosis not present

## 2016-04-20 NOTE — Therapy (Addendum)
[place], [place], [address] Phone: [phone]   Fax:  [phone]  Physical Therapy Treatment  Patient Details  Name: Julia Estrada MRN: [id number] Date of Birth: 1971/04/11 Referring Provider: Dr. Sharol Given   Encounter Date: 04/20/2016      PT End of Session - 04/20/16 1349    Visit Number 18   Number of Visits 22   Date for PT Re-Evaluation 04/29/16   PT Start Time 1346   PT Stop Time 1428   PT Time Calculation (min) 42 min   Activity Tolerance Patient tolerated treatment well   Behavior During Therapy [facility] for tasks assessed/performed      Past Medical History:  Diagnosis Date  . Anemia    iron deficiency  . Anxiety   . Depression 2011  . Diabetes mellitus 2010   type II  . GERD (gastroesophageal reflux disease) 2004  . Hyperlipidemia   . Hypertension 2008  . Obesity    BMI=44  . Shortness of breath    with exercise  . Sleep apnea    uses CPAP every night  . Vitamin B12 deficiency   . Vitamin D deficiency     Past Surgical History:  Procedure Laterality Date  . CESAREAN SECTION  2004   x 1  . COLONOSCOPY    . [place] N/A 01/09/2015   Procedure: DILATATION & CURETTAGE/HYSTEROSCOPY WITH NOVASURE ABLATION;  Surgeon: Harle Battiest, MD;  Location: [facility];  Service: Gynecology;  Laterality: N/A;  . fibroidectomy  2002   laparotomy for removal of fibroids  . MYOMECTOMY  2002  . SHOULDER ARTHROSCOPY Left 04/30/2014   Procedure: Left Shoulder Arthroscopy and Debridement;  Surgeon: Newt Minion, MD;  Location: [facility];  Service: Orthopedics;  Laterality: Left;  . TONSILLECTOMY AND ADENOIDECTOMY    . UPPER GI ENDOSCOPY    . WISDOM TOOTH EXTRACTION      There were no vitals filed for this visit.      Subjective Assessment - 04/20/16 1352    Subjective Pain relief lasted until last night. Today, min pain.                            [place] Adult PT Treatment/Exercise - 04/20/16 1358      Lumbar Exercises: Stretches   ITB Stretch 3 reps;30 seconds   ITB Stretch Limitations ITB and TFL against the wall      Lumbar Exercises: Aerobic   Stationary Bike NuStep 8 min Level 7 UE and LE      Lumbar Exercises: Supine   Heel Slides 10 reps   Heel Slides Limitations on ball    Bridge 10 reps   Bridge Limitations on ball    Other Supine Lumbar Exercises ball ex for core stability     Lumbar Exercises: Sidelying   Clam 20 reps   Clam Limitations 4 lbs    Hip Abduction 10 reps   Other Sidelying Lumbar Exercises sidekick (hip flex/ext ) with 4 lbs      Manual Therapy   Soft tissue mobilization Lateral SI border on L side, L piriformis    Myofascial Release L hip in sidelying   surrounding gr Trochanter with biofreeze                  PT Short Term Goals - 04/18/16 1339      PT SHORT TERM GOAL #1  Title Independent with initial HEP for hip, trunk.     Status Achieved     PT SHORT TERM GOAL #2   Title Pain decreased 25% with work duties, including sitting for >15 min    Status Achieved     PT SHORT TERM GOAL #3   Title Pt will be able to move on mat table (rolll, transfer) without increased pain using good body mechanics STG=LTG   Status Achieved     PT SHORT TERM GOAL #4   Title Pt will be able to walk up stairs to her apt with no increase in pain, 25% less fatigue, dyspnea. STG=LTG   Baseline hurts sometimes    Status Partially Met     PT SHORT TERM GOAL #5   Title Pt will score less than 30% limited on FOTO     Status Unable to assess                  Plan - 04/20/16 1353    Clinical Impression Statement Pt lacks endurance in lateral hip, glute mm, pain inhibits.  Agreeable to repeat of dry needling, likely DC after that.    PT Treatment/Interventions Ultrasound;Traction;Balance training;Neuromuscular re-education;Patient/family  education;Cryotherapy;Moist Heat;Therapeutic exercise;Manual techniques;Taping;Therapeutic activities;Electrical Stimulation;Dry needling   PT Next Visit Plan strength in hips, core, dry needling, soft tissue   PT Home Exercise Plan reviewed previous HEP   Consulted and Agree with Plan of Care Patient      Patient will benefit from skilled therapeutic intervention in order to improve the following deficits and impairments:  Increased fascial restricitons, Obesity, Pain, Impaired flexibility, Impaired sensation, Decreased strength, Decreased mobility, Improper body mechanics, Decreased balance  Visit Diagnosis: Pain in left hip  Muscle weakness (generalized)     Problem List Patient Active Problem List   Diagnosis Date Noted  . Frequency of urination 12/15/2015  . Muscle cramps 10/12/2015  . Wheezing 08/04/2015  . Cough 08/04/2015  . Well adult exam 05/12/2015  . Dyslipidemia 10/03/2014  . Allergic rhinitis 04/12/2014  . Mild persistent asthma 04/12/2014  . Dyspnea 04/12/2014  . Left shoulder pain 02/07/2014  . 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 Intertrigo 03/30/2012  . OSA on CPAP 03/30/2012  . GERD (gastroesophageal reflux disease) 04/12/2011  . Abdominal pain, epigastric 05/04/2010  . Unspecified eustachian tube disorder 02/23/2010  . OTITIS MEDIA, LEFT 02/23/2010  . INTERMITTENT VERTIGO 02/23/2010  . NAUSEA 01/18/2010  . LUQ PAIN 01/18/2010  . 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 CHEST PAIN 10/05/2009  . DEPRESSION 09/07/2009  . Obesity 07/17/2009  . Diabetes type 2, uncontrolled ([place]) 04/14/2009  . 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 ELEVATED BP 10/02/2008  . B12 deficiency 08/30/2007  . Vitamin D deficiency 08/30/2007  . HAIR LOSS 08/30/2007  . PARESTHESIA 08/30/2007  . WEIGHT GAIN 08/30/2007  . [facility] DEFICIENCY 05/28/2007    Vuk Skillern 04/20/2016, 2:31 PM  [facility] [address] [place], [place],  [address] Phone: [phone]   Fax:  [phone]  Name: BEDIE DOMINEY MRN: [id number] Date of Birth: 03/28/1971  Raeford Razor, PT 04/20/16 2:32 PM Phone: [phone] Fax: [phone]

## 2016-04-25 ENCOUNTER — Ambulatory Visit: Payer: 59 | Admitting: Physical Therapy

## 2016-04-25 DIAGNOSIS — M25552 Pain in left hip: Secondary | ICD-10-CM | POA: Diagnosis not present

## 2016-04-25 DIAGNOSIS — R531 Weakness: Secondary | ICD-10-CM

## 2016-04-25 DIAGNOSIS — M25512 Pain in left shoulder: Secondary | ICD-10-CM

## 2016-04-25 DIAGNOSIS — M6281 Muscle weakness (generalized): Secondary | ICD-10-CM

## 2016-04-25 DIAGNOSIS — M25612 Stiffness of left shoulder, not elsewhere classified: Secondary | ICD-10-CM

## 2016-04-25 NOTE — Therapy (Signed)
[place], [place], [address] Phone: [phone]   Fax:  [phone]  Physical Therapy Treatment  Patient Details  Name: Julia Estrada MRN: [id number] Date of Birth: 1971/03/10 Referring Provider: Dr. Sharol Given   Encounter Date: 04/25/2016      PT End of Session - 04/25/16 1147    Visit Number 19   Number of Visits 22   Date for PT Re-Evaluation 04/29/16   PT Start Time 1147   PT Stop Time 1238   PT Time Calculation (min) 51 min   Activity Tolerance Patient tolerated treatment well   Behavior During Therapy [facility] for tasks assessed/performed      Past Medical History:  Diagnosis Date  . Anemia    iron deficiency  . Anxiety   . Depression 2011  . Diabetes mellitus 2010   type II  . GERD (gastroesophageal reflux disease) 2004  . Hyperlipidemia   . Hypertension 2008  . Obesity    BMI=44  . Shortness of breath    with exercise  . Sleep apnea    uses CPAP every night  . Vitamin B12 deficiency   . Vitamin D deficiency     Past Surgical History:  Procedure Laterality Date  . CESAREAN SECTION  2004   x 1  . COLONOSCOPY    . [place] N/A 01/09/2015   Procedure: DILATATION & CURETTAGE/HYSTEROSCOPY WITH NOVASURE ABLATION;  Surgeon: Harle Battiest, MD;  Location: [facility];  Service: Gynecology;  Laterality: N/A;  . fibroidectomy  2002   laparotomy for removal of fibroids  . MYOMECTOMY  2002  . SHOULDER ARTHROSCOPY Left 04/30/2014   Procedure: Left Shoulder Arthroscopy and Debridement;  Surgeon: Newt Minion, MD;  Location: [facility];  Service: Orthopedics;  Laterality: Left;  . TONSILLECTOMY AND ADENOIDECTOMY    . UPPER GI ENDOSCOPY    . WISDOM TOOTH EXTRACTION      There were no vitals filed for this visit.      Subjective Assessment - 04/25/16 1148    Subjective "things are going pretty good, hips been hurting lately more especially last week"    Currently in Pain? Yes   Pain Score 2    Pain Location Hip   Pain Orientation Left   Aggravating Factors  prolong sitting at work                         [facility] Adult PT Treatment/Exercise - 04/25/16 0001      Knee/Hip Exercises: Stretches   Hip Flexor Stretch 3 reps;30 seconds  contract/ relax with 10 sec hold     Knee/Hip Exercises: Sidelying   Hip ABduction Limitations elliptical 2 x 10 forward and backward     Moist Heat Therapy   Number Minutes Moist Heat 10 Minutes   Moist Heat Location Hip     Manual Therapy   Myofascial Release L hip in sidelying    Manual Traction long axis in supine , used sidelying to           Trigger Point Dry Needling - 04/25/16 1157    Consent Given? Yes   Education Handout Provided Yes  given previously   Muscles Treated Lower Body Tensor fascia lata;Quadriceps   Piriformis Response Twitch response elicited;Palpable increased muscle length  x1 with pistoning   Tensor Fascia Lata Response Twitch response elicited;Palpable increased muscle length  x2   Quadriceps Response Twitch response elicited;Palpable  increased muscle length  rectus femoris x2 with pistoning and twisting techniques              PT Education - 04/25/16 1228    Education provided Yes   Education Details for pt to bring in all HEP for review next visit because next visit is discharge   Person(s) Educated Patient   Methods Explanation;Verbal cues   Comprehension Verbal cues required;Verbalized understanding          PT Short Term Goals - 04/18/16 1339      PT SHORT TERM GOAL #1   Title Independent with initial HEP for hip, trunk.     Status Achieved     PT SHORT TERM GOAL #2   Title Pain decreased 25% with work duties, including sitting for >15 min    Status Achieved     PT SHORT TERM GOAL #3   Title Pt will be able to move on mat table (rolll, transfer) without increased pain using good body mechanics STG=LTG   Status Achieved      PT SHORT TERM GOAL #4   Title Pt will be able to walk up stairs to her apt with no increase in pain, 25% less fatigue, dyspnea. STG=LTG   Baseline hurts sometimes    Status Partially Met     PT SHORT TERM GOAL #5   Title Pt will score less than 30% limited on FOTO     Status Unable to assess                  Plan - 04/25/16 1231    Clinical Impression Statement [name] states she has soreness in the anterior aspect of the L thigh. DN was performed on the TFL and proximal rectus femoris which pt reports relief follwoing soft tissue work and stretching. pt was able to do exercies following with minimal report of sorneess. plan to discharge next visit.    PT Next Visit Plan ROM, GOALS, discharge next visit. review HEP given   Consulted and Agree with Plan of Care Patient      Patient will benefit from skilled therapeutic intervention in order to improve the following deficits and impairments:  Increased fascial restricitons, Obesity, Pain, Impaired flexibility, Impaired sensation, Decreased strength, Decreased mobility, Improper body mechanics, Decreased balance  Visit Diagnosis: Pain in left hip  Muscle weakness (generalized)  Pain in joint, shoulder region, left  Weakness  Stiffness of joint, shoulder region, left     Problem List Patient Active Problem List   Diagnosis Date Noted  . Frequency of urination 12/15/2015  . Muscle cramps 10/12/2015  . Wheezing 08/04/2015  . Cough 08/04/2015  . Well adult exam 05/12/2015  . Dyslipidemia 10/03/2014  . Allergic rhinitis 04/12/2014  . Mild persistent asthma 04/12/2014  . Dyspnea 04/12/2014  . Left shoulder pain 02/07/2014  . 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 Intertrigo 03/30/2012  . OSA on CPAP 03/30/2012  . GERD (gastroesophageal reflux disease) 04/12/2011  . Abdominal pain, epigastric 05/04/2010  . Unspecified eustachian tube disorder 02/23/2010  . OTITIS MEDIA, LEFT 02/23/2010  . INTERMITTENT VERTIGO  02/23/2010  . NAUSEA 01/18/2010  . LUQ PAIN 01/18/2010  . 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 CHEST PAIN 10/05/2009  . DEPRESSION 09/07/2009  . Obesity 07/17/2009  . Diabetes type 2, uncontrolled ([place]) 04/14/2009  . 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 ELEVATED BP 10/02/2008  . B12 deficiency 08/30/2007  . Vitamin D deficiency 08/30/2007  . HAIR LOSS 08/30/2007  . PARESTHESIA 08/30/2007  .  WEIGHT GAIN 08/30/2007  . [facility] DEFICIENCY 05/28/2007   Starr Lake PT, DPT, LAT, ATC  04/25/16  12:37 PM      [facility] [address] [place], [place], [address] Phone: [phone]   Fax:  [phone]  Name: Julia Estrada MRN: [id number] Date of Birth: [birth year]

## 2016-04-27 ENCOUNTER — Ambulatory Visit: Payer: 59 | Admitting: Physical Therapy

## 2016-04-27 DIAGNOSIS — M25612 Stiffness of left shoulder, not elsewhere classified: Secondary | ICD-10-CM

## 2016-04-27 DIAGNOSIS — M25512 Pain in left shoulder: Secondary | ICD-10-CM

## 2016-04-27 DIAGNOSIS — R531 Weakness: Secondary | ICD-10-CM

## 2016-04-27 DIAGNOSIS — M25552 Pain in left hip: Secondary | ICD-10-CM | POA: Diagnosis not present

## 2016-04-27 DIAGNOSIS — M6281 Muscle weakness (generalized): Secondary | ICD-10-CM

## 2016-04-27 NOTE — Therapy (Signed)
[place], [place], [address] Phone: [phone]   Fax:  [phone]  Physical Therapy Treatment / Discharge Note  Patient Details  Name: Julia Estrada MRN: [id number] Date of Birth: [birth year] Referring Provider: Dr. Sharol Given   Encounter Date: 04/27/2016      PT End of Session - 04/27/16 1358    Visit Number 20   Number of Visits 22   Date for PT Re-Evaluation 04/29/16   PT Start Time 1330   PT Stop Time 1412   PT Time Calculation (min) 42 min   Activity Tolerance Patient tolerated treatment well   Behavior During Therapy [facility] for tasks assessed/performed      Past Medical History:  Diagnosis Date  . Anemia    iron deficiency  . Anxiety   . Depression 2011  . Diabetes mellitus 2010   type II  . GERD (gastroesophageal reflux disease) 2004  . Hyperlipidemia   . Hypertension 2008  . Obesity    BMI=44  . Shortness of breath    with exercise  . Sleep apnea    uses CPAP every night  . Vitamin B12 deficiency   . Vitamin D deficiency     Past Surgical History:  Procedure Laterality Date  . CESAREAN SECTION  2004   x 1  . COLONOSCOPY    . [place] N/A 01/09/2015   Procedure: DILATATION & CURETTAGE/HYSTEROSCOPY WITH NOVASURE ABLATION;  Surgeon: Harle Battiest, MD;  Location: [facility];  Service: Gynecology;  Laterality: N/A;  . fibroidectomy  2002   laparotomy for removal of fibroids  . MYOMECTOMY  2002  . SHOULDER ARTHROSCOPY Left 04/30/2014   Procedure: Left Shoulder Arthroscopy and Debridement;  Surgeon: Newt Minion, MD;  Location: [facility];  Service: Orthopedics;  Laterality: Left;  . TONSILLECTOMY AND ADENOIDECTOMY    . UPPER GI ENDOSCOPY    . WISDOM TOOTH EXTRACTION      There were no vitals filed for this visit.      Subjective Assessment - 04/27/16 1334    Subjective "things are going okay, feeling good yesterday, I am alittle sore today"    Currently in Pain? Yes   Pain Score 3    Pain Location Hip   Pain Orientation Left   Pain Type Chronic pain   Pain Onset More than a month ago   Pain Frequency Intermittent   Pain Relieving Factors exercise            [facility] PT Assessment - 04/27/16 0001      Observation/Other Assessments   Focus on Therapeutic Outcomes (FOTO)  30%limited     AROM   Left Hip Extension 15   Left Hip Flexion 65  soreness in the lateral aspect of the hip   Left Hip External Rotation  30   Left Hip Internal Rotation  31   Lumbar Flexion 82   Lumbar Extension 22  no pain noted     Strength   Right Hip Flexion 5/5   Right Hip ABduction 5/5   Left Hip Flexion 4+/5   Left Hip ABduction 5/5                     [facility] Adult PT Treatment/Exercise - 04/27/16 0001      Lumbar Exercises: Aerobic   Stationary Bike NuStep 8 min Level 7 UE and LE      Knee/Hip Exercises: Stretches   Hip Flexor Stretch  4 reps;30 seconds  contract/ relax with 10 sec contraction     Manual Therapy   Myofascial Release DTM over the hip flexors and proximal / mid quad on the L   educated how to perform at home.                 PT Education - 04/27/16 1411    Education provided Yes   Education Details reviewed previous HEP, provided new theraband and discussed how to progress exercises to assist with endurance with increased reps/ sets but maintaing proper form. how to perform self DTM using rolling pin in sitting to relieve muscle tightness.    Person(s) Educated Patient   Methods Explanation;Verbal cues;Demonstration   Comprehension Verbalized understanding;Verbal cues required;Returned demonstration          PT Short Term Goals - 04/27/16 1356      PT SHORT TERM GOAL #1   Title Independent with initial HEP for hip, trunk.     Time 4   Period Weeks   Status Achieved     PT SHORT TERM GOAL #2   Title Pain decreased 25% with work duties, including sitting for >15 min    Time 4    Period Weeks   Status Achieved     PT SHORT TERM GOAL #3   Title Pt will be able to move on mat table (rolll, transfer) without increased pain using good body mechanics STG=LTG   Time 4   Period Weeks   Status Achieved     PT SHORT TERM GOAL #4   Title Pt will be able to walk up stairs to her apt with no increase in pain, 25% less fatigue, dyspnea. STG=LTG   Time 4   Period Weeks   Status Partially Met     PT SHORT TERM GOAL #5   Title Pt will score less than 30% limited on FOTO     Baseline 30% limited   Time 4   Period Weeks   Status Achieved                  Plan - 04/27/16 1412    Clinical Impression Statement Mrs. Julia Estrada has improved in her L hip mobility and strength with mild rpeort of pain during assessment. trunk mobility has improved with no radiating pain. she has met or partially met all goals and reports she is able to maintain and progress her current level of function independently and will be discharged today.    PT Next Visit Plan discharged   PT Home Exercise Plan reviewed HEP and ho wto progress   Consulted and Agree with Plan of Care Patient      Patient will benefit from skilled therapeutic intervention in order to improve the following deficits and impairments:  Increased fascial restricitons, Obesity, Pain, Impaired flexibility, Impaired sensation, Decreased strength, Decreased mobility, Improper body mechanics, Decreased balance  Visit Diagnosis: Pain in left hip  Muscle weakness (generalized)  Pain in joint, shoulder region, left  Weakness  Stiffness of joint, shoulder region, left     Problem List Patient Active Problem List   Diagnosis Date Noted  . Frequency of urination 12/15/2015  . Muscle cramps 10/12/2015  . Wheezing 08/04/2015  . Cough 08/04/2015  . Well adult exam 05/12/2015  . Dyslipidemia 10/03/2014  . Allergic rhinitis 04/12/2014  . Mild persistent asthma 04/12/2014  . Dyspnea 04/12/2014  . Left shoulder pain  02/07/2014  . 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 Intertrigo 03/30/2012  .  OSA on CPAP 03/30/2012  . GERD (gastroesophageal reflux disease) 04/12/2011  . Abdominal pain, epigastric 05/04/2010  . Unspecified eustachian tube disorder 02/23/2010  . OTITIS MEDIA, LEFT 02/23/2010  . INTERMITTENT VERTIGO 02/23/2010  . NAUSEA 01/18/2010  . LUQ PAIN 01/18/2010  . 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 CHEST PAIN 10/05/2009  . DEPRESSION 09/07/2009  . Obesity 07/17/2009  . Diabetes type 2, uncontrolled ([place]) 04/14/2009  . 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 ELEVATED BP 10/02/2008  . B12 deficiency 08/30/2007  . Vitamin D deficiency 08/30/2007  . HAIR LOSS 08/30/2007  . PARESTHESIA 08/30/2007  . WEIGHT GAIN 08/30/2007  . [facility] DEFICIENCY 05/28/2007   Starr Lake PT, DPT, LAT, ATC  04/27/16  2:17 PM      [facility] [address] [place], [place], [address] Phone: [phone]   Fax:  [phone]  Name: Julia Estrada MRN: [id number] Date of Birth: [birth year]  PHYSICAL THERAPY DISCHARGE SUMMARY  Visits from Start of Care: 20  Current functional level related to goals / functional outcomes: See goals   Remaining deficits: Intermittent hip flexor pain and glute medius tightness on the L compared bil that pt is able to reduce with stretching and exercise. Continued fatigue with strenuous exercise specifically up stairs.    Education / Equipment: HEP, theraband, posture training.   Plan: Patient agrees to discharge.  Patient goals were partially met. Patient is being discharged due to meeting the stated rehab goals.  ?????

## 2016-05-02 ENCOUNTER — Encounter: Payer: 59 | Admitting: Physical Therapy

## 2016-05-04 ENCOUNTER — Encounter: Payer: 59 | Admitting: Physical Therapy

## 2016-05-06 ENCOUNTER — Ambulatory Visit (INDEPENDENT_AMBULATORY_CARE_PROVIDER_SITE_OTHER)
Admission: RE | Admit: 2016-05-06 | Discharge: 2016-05-06 | Disposition: A | Discharge status: Home / Self Care | Payer: 59 | Source: Ambulatory Visit | Attending: Internal Medicine | Admitting: Internal Medicine

## 2016-05-06 ENCOUNTER — Emergency Department (HOSPITAL_COMMUNITY)
Admission: EM | Admit: 2016-05-06 | Discharge: 2016-05-06 | Disposition: A | Discharge status: Home / Self Care | Payer: 59 | Source: Home / Self Care | Attending: Emergency Medicine | Admitting: Emergency Medicine

## 2016-05-06 ENCOUNTER — Encounter (HOSPITAL_COMMUNITY): Payer: Self-pay | Admitting: Emergency Medicine

## 2016-05-06 ENCOUNTER — Other Ambulatory Visit (INDEPENDENT_AMBULATORY_CARE_PROVIDER_SITE_OTHER): Payer: 59

## 2016-05-06 ENCOUNTER — Ambulatory Visit (INDEPENDENT_AMBULATORY_CARE_PROVIDER_SITE_OTHER): Payer: 59 | Admitting: Internal Medicine

## 2016-05-06 ENCOUNTER — Telehealth: Payer: Self-pay | Admitting: Endocrinology

## 2016-05-06 VITALS — BP 130/78 | HR 107 | Temp 98.6°F | Resp 20 | Wt 270.0 lb

## 2016-05-06 DIAGNOSIS — J4530 Mild persistent asthma, uncomplicated: Secondary | ICD-10-CM | POA: Insufficient documentation

## 2016-05-06 DIAGNOSIS — R350 Frequency of micturition: Secondary | ICD-10-CM

## 2016-05-06 DIAGNOSIS — R0600 Dyspnea, unspecified: Secondary | ICD-10-CM | POA: Diagnosis not present

## 2016-05-06 DIAGNOSIS — I10 Essential (primary) hypertension: Secondary | ICD-10-CM

## 2016-05-06 DIAGNOSIS — R531 Weakness: Secondary | ICD-10-CM | POA: Diagnosis not present

## 2016-05-06 DIAGNOSIS — K7200 Acute and subacute hepatic failure without coma: Secondary | ICD-10-CM | POA: Diagnosis not present

## 2016-05-06 DIAGNOSIS — R42 Dizziness and giddiness: Secondary | ICD-10-CM | POA: Insufficient documentation

## 2016-05-06 DIAGNOSIS — N926 Irregular menstruation, unspecified: Secondary | ICD-10-CM

## 2016-05-06 DIAGNOSIS — R509 Fever, unspecified: Secondary | ICD-10-CM | POA: Diagnosis not present

## 2016-05-06 DIAGNOSIS — E119 Type 2 diabetes mellitus without complications: Secondary | ICD-10-CM

## 2016-05-06 DIAGNOSIS — Z79899 Other long term (current) drug therapy: Secondary | ICD-10-CM

## 2016-05-06 LAB — BASIC METABOLIC PANEL
Anion gap: 10 (ref 5–15)
BUN: 12 mg/dL (ref 6–23)
BUN: 13 mg/dL (ref 6–20)
CALCIUM: 9.3 mg/dL (ref 8.9–10.3)
CHLORIDE: 101 meq/L (ref 96–112)
CHLORIDE: 103 mmol/L (ref 101–111)
CO2: 24 mEq/L (ref 19–32)
CO2: 24 mmol/L (ref 22–32)
CREATININE: 0.82 mg/dL (ref 0.40–1.20)
CREATININE: 0.83 mg/dL (ref 0.44–1.00)
Calcium: 9.5 mg/dL (ref 8.4–10.5)
GFR: 96.92 mL/min (ref >60.00)
Glucose, Bld: 142 mg/dL — ABNORMAL HIGH (ref 70–99)
Glucose, Bld: 109 mg/dL — ABNORMAL HIGH (ref 65–99)
Potassium: 3.2 mEq/L — ABNORMAL LOW (ref 3.5–5.1)
Potassium: 3.6 mmol/L (ref 3.5–5.1)
SODIUM: 136 meq/L (ref 135–145)
SODIUM: 137 mmol/L (ref 135–145)

## 2016-05-06 LAB — URINALYSIS, ROUTINE W REFLEX MICROSCOPIC
Bilirubin Urine: NEGATIVE
Glucose, UA: >1000 mg/dL — AB
Ketones, ur: NEGATIVE
Ketones, ur: NEGATIVE mg/dL
LEUKOCYTES UA: NEGATIVE
Leukocytes, UA: NEGATIVE
NITRITE: NEGATIVE
Nitrite: NEGATIVE
PH: 6 (ref 5.0–8.0)
PROTEIN: 100 mg/dL — AB
SPECIFIC GRAVITY, URINE: 1.01 (ref 1.000–1.030)
Specific Gravity, Urine: 1.042 — ABNORMAL HIGH (ref 1.005–1.030)
TOTAL PROTEIN, URINE-UPE24: 100 — AB
Urine Glucose: >=1000 — AB
Urobilinogen, UA: 1 (ref 0.0–1.0)
pH: 6 (ref 5.0–8.0)

## 2016-05-06 LAB — CBC
HCT: 35.1 % — ABNORMAL LOW (ref 36.0–46.0)
Hemoglobin: 10.6 g/dL — ABNORMAL LOW (ref 12.0–15.0)
MCH: 20.8 pg — ABNORMAL LOW (ref 26.0–34.0)
MCHC: 30.2 g/dL (ref 30.0–36.0)
MCV: 68.8 fL — ABNORMAL LOW (ref 78.0–100.0)
PLATELETS: 338 10*3/uL (ref 150–400)
RBC: 5.1 MIL/uL (ref 3.87–5.11)
RDW: 19.2 % — ABNORMAL HIGH (ref 11.5–15.5)
WBC: 8.9 10*3/uL (ref 4.0–10.5)

## 2016-05-06 LAB — CBC WITH DIFFERENTIAL/PLATELET
Basophils Absolute: 0 10*3/uL (ref 0.0–0.1)
Basophils Relative: 0.2 % (ref 0.0–3.0)
EOS PCT: 4.7 % (ref 0.0–5.0)
Eosinophils Absolute: 0.3 10*3/uL (ref 0.0–0.7)
HCT: 32.8 % — ABNORMAL LOW (ref 36.0–46.0)
Hemoglobin: 10.3 g/dL — ABNORMAL LOW (ref 12.0–15.0)
LYMPHS ABS: 0.9 10*3/uL (ref 0.7–4.0)
Lymphocytes Relative: 12.9 % (ref 12.0–46.0)
MCHC: 31.3 g/dL (ref 30.0–36.0)
MONO ABS: 0.2 10*3/uL (ref 0.1–1.0)
MONOS PCT: 3.2 % (ref 3.0–12.0)
NEUTROS ABS: 5.7 10*3/uL (ref 1.4–7.7)
NEUTROS PCT: 79 % — AB (ref 43.0–77.0)
PLATELETS: 356 10*3/uL (ref 150.0–400.0)
RBC: 4.87 Mil/uL (ref 3.87–5.11)
RDW: 20.5 % — ABNORMAL HIGH (ref 11.5–15.5)
WBC: 7.3 10*3/uL (ref 4.0–10.5)

## 2016-05-06 LAB — URINE MICROSCOPIC-ADD ON

## 2016-05-06 LAB — HEPATIC FUNCTION PANEL
ALBUMIN: 3.8 g/dL (ref 3.5–5.2)
ALK PHOS: 123 U/L — AB (ref 39–117)
ALT: 925 U/L — ABNORMAL HIGH (ref 0–35)
AST: 781 U/L — AB (ref 0–37)
BILIRUBIN DIRECT: 0.2 mg/dL (ref 0.0–0.3)
TOTAL PROTEIN: 7.6 g/dL (ref 6.0–8.3)
Total Bilirubin: 1 mg/dL (ref 0.2–1.2)

## 2016-05-06 LAB — I-STAT BETA HCG BLOOD, ED (MC, WL, AP ONLY): I-stat hCG, quantitative: <5 m[IU]/mL (ref <5)

## 2016-05-06 LAB — CBG MONITORING, ED: GLUCOSE-CAPILLARY: 96 mg/dL (ref 65–99)

## 2016-05-06 MED ORDER — MECLIZINE HCL 12.5 MG PO TABS: 12.5000 mg | ORAL_TABLET | Freq: Three times a day (TID) | ORAL | 1 refills | Status: DC | PRN

## 2016-05-06 MED ORDER — LEVOFLOXACIN 500 MG PO TABS: 500.0000 mg | ORAL_TABLET | Freq: Every day | ORAL | 0 refills | Status: DC

## 2016-05-06 NOTE — Discharge Instructions (Signed)
Please drink more water and rest through the weekend.  You blood work is reassuring today.  Please follow-up with your doctor next week.  Please return to the ER for new or worsening symptoms.

## 2016-05-06 NOTE — ED Notes (Signed)
PA at bedside.

## 2016-05-06 NOTE — Progress Notes (Addendum)
Subjective:    Patient ID: Julia Estrada, female    DOB: 04/19/1971, 45 y.o.   MRN: [id number]  HPI  Here with numerous symptoms x 2 days of feverishness, feeling hot and cold. Recurring severe HA's, vertigo with sitting up or lying down, sob/doe, and urinary frequency, as well as a couple of lower blood sugars to 60s in last day, and found to have higher HR today. No sick contacts.  No other sinus, ear, throat symptoms such as pain or swelling.  Has some slight nonprod cough, but Pt denies chest pain, wheezing, orthopnea, PND, increased LE swelling, palpitations, dizziness or syncope. Doesn't feel the elevated HR.  Denies worsening reflux, abd pain, dysphagia, n/v, bowel change or blood.   Addendum: as pt was sitting up and getting off exam table became dizzy and stumbled with fall from height lower than exam table to both knees, but denied immediate significant pain, swelling and would/did ambulate herself to the lab and xray after getting back up relatively quickly Past Medical History:  Diagnosis Date  . Anemia    iron deficiency  . Anxiety   . Depression 2011  . Diabetes mellitus 2010   type II  . GERD (gastroesophageal reflux disease) 2004  . Hyperlipidemia   . Hypertension 2008  . Obesity    BMI=44  . Shortness of breath    with exercise  . Sleep apnea    uses CPAP every night  . Vitamin B12 deficiency   . Vitamin D deficiency    Past Surgical History:  Procedure Laterality Date  . CESAREAN SECTION  2004   x 1  . COLONOSCOPY    . DILITATION & CURRETTAGE/HYSTROSCOPY WITH NOVASURE ABLATION N/A 01/09/2015   Procedure: DILATATION & CURETTAGE/HYSTEROSCOPY WITH NOVASURE ABLATION;  Surgeon: Donovan KailAllen Ross, MD;  Location: [facility];  Service: Gynecology;  Laterality: N/A;  . fibroidectomy  2002   laparotomy for removal of fibroids  . MYOMECTOMY  2002  . SHOULDER ARTHROSCOPY Left 04/30/2014   Procedure: Left Shoulder Arthroscopy and Debridement;  Surgeon: Nadara MustardMarcus Duda V, MD;  [facility];  Service: Orthopedics;  Laterality: Left;  . TONSILLECTOMY AND ADENOIDECTOMY    . UPPER GI ENDOSCOPY    . WISDOM TOOTH EXTRACTION      reports that she has never smoked. She has never used smokeless tobacco. She reports that she does not drink alcohol or use drugs. family history includes Allergic rhinitis in her sister; Cancer in her mother; Cholelithiasis in her sister; Clotting disorder in her daughter; Diabetes in her mother; Heart disease in her maternal grandfather and maternal uncle; Hypertension in her mother; Kidney cancer in her mother; Urticaria in her daughter. Allergies  Allergen Reactions  . Codeine Sulfate Nausea And Vomiting  . Hydrochlorothiazide W-Triamterene     REACTION: cramping  . Oxycodone-Acetaminophen Nausea And Vomiting  . Prednisone Hives  . Spironolactone     Cramps   . Victoza [Liraglutide]     n/v   Current Outpatient Prescriptions on File Prior to Visit  Medication Sig Dispense Refill  . albuterol (VENTOLIN HFA) 108 (90 Base) MCG/ACT inhaler Inhale 2 puffs into the lungs every 6 (six) hours as needed for wheezing or shortness of breath. 3 Inhaler 1  . azelastine (ASTELIN) 0.1 % nasal spray Place 1 spray into both nostrils 2 (two) times daily. Use in each nostril as directed    . cholecalciferol (VITAMIN D) 1000 UNITS tablet Take 1 tablet (1,000 Units total) by mouth daily. 100  tablet 3  . diphenhydrAMINE (BENADRYL) 25 MG tablet Take 1 tablet (25 mg total) by mouth every 6 (six) hours as needed for itching or allergies (hives). 100 tablet 1  . empagliflozin (JARDIANCE) 10 MG TABS tablet Take 25 mg by mouth daily.     Marland Kitchen esomeprazole (NEXIUM) 40 MG capsule Take 1 capsule (40 mg total) by mouth daily. 30 capsule 0  . ferrous sulfate 325 (65 FE) MG tablet Take 325 mg by mouth daily with breakfast.    . fexofenadine (ALLEGRA) 180 MG tablet Take 180 mg by mouth daily as needed for allergies.     . fluconazole (DIFLUCAN) 150 MG tablet Take 1 tablet (150 mg  total) by mouth once. 1 tablet 1  . fluticasone (FLONASE) 50 MCG/ACT nasal spray Place 2 sprays into both nostrils daily.    Marland Kitchen glucose blood (ONETOUCH VERIO) test strip Use as instructed to check blood sugar twice a day dx code E11.65 200 each 1  . ibuprofen (ADVIL,MOTRIN) 800 MG tablet Take 800 mg by mouth every 8 (eight) hours as needed for fever, headache or mild pain.     Marland Kitchen JARDIANCE 25 MG TABS tablet Take 1 tablet by mouth  daily 90 tablet 1  . metFORMIN (GLUCOPHAGE-XR) 500 MG 24 hr tablet Take 4 tablets by mouth  daily 360 tablet 0  . montelukast (SINGULAIR) 10 MG tablet Take 1 tablet by mouth at  bedtime 90 tablet 1  . olmesartan (BENICAR) 40 MG tablet Take 1 tablet (40 mg total) by mouth daily. 30 tablet 11  . ONETOUCH DELICA LANCETS FINE MISC Use 2 per day dx code E11.65 200 each 1  . PREVIFEM 0.25-35 MG-MCG tablet Take 1 tablet by mouth daily.    . ranitidine (ZANTAC) 300 MG tablet Take 1 tablet by mouth at  bedtime 90 tablet 3  . simvastatin (ZOCOR) 20 MG tablet Take 1 tablet by mouth at  bedtime 90 tablet 0  . TANZEUM 50 MG PEN Inject the contents of one  pen once per week 12 each 2  . triamcinolone cream (KENALOG) 0.5 % Apply 1 application topically 3 (three) times daily. On rash 90 g 1  . valACYclovir (VALTREX) 1000 MG tablet Take 1,000 mg by mouth daily.      No current facility-administered medications on file prior to visit.    Review of Systems  Constitutional: Negative for night sweats HENT: Negative for ear swelling or discharge Eyes: Negative for worsening visual haziness  Respiratory: Negative for choking and stridor.   Gastrointestinal: Negative for distension or worsening eructation Genitourinary: Negative for retention or change in urine volume.  Musculoskeletal: Negative for other MSK pain or swelling Skin: Negative for color change and worsening wound Neurological: Negative for tremors and numbness other than noted  Psychiatric/Behavioral: Negative for decreased  concentration or agitation other than above       Objective:   Physical Exam BP 130/78   Pulse (!) 107   Temp 98.6 F (37 C) (Oral)   Resp 20   Wt 270 lb (122.5 kg)   SpO2 98%   BMI 46.35 kg/m  VS noted, mild ill appearing, wipes brown several times with clamminess Constitutional: Pt appears in no apparent distress HENT: Head: NCAT.  Right Ear: External ear normal.  Left Ear: External ear normal.  Bilat tm's with mild erythema.  Max sinus areas non tender.  Pharynx with minor erythema, no exudate; Eyes: . Pupils are equal, round, and reactive to light. Conjunctivae and EOM  are normal Neck: Normal range of motion. Neck supple. but has few right submandib tender LA < 1 cm Cardiovascular: Normal rate and regular rhythm.   Pulmonary/Chest: Effort normal and breath sounds somewhat decreased without rales or wheezing.  Abd:  Soft, NT, ND, + BS Neurological: Pt is alert. Not confused , motor 5/5 intact Skin: Skin is warm. No rash, no LE edema Psychiatric: Pt behavior is normal. No agitation. 2+ nervous     Assessment & Plan:

## 2016-05-06 NOTE — Patient Instructions (Addendum)
Please take all new medication as prescribed - the antibiotic  Please continue all other medications as before, except OK to hold off on the Jardiance for 1 wk or until you feel better  Please have the pharmacy call with any other refills you may need.  Please keep your appointments with your specialists as you may have planned  Please go to the XRAY Department in the Basement (go straight as you get off the elevator) for the x-ray testing  Please go to the LAB in the Basement (turn left off the elevator) for the tests to be done today  You will be contacted by phone if any changes need to be made immediately.  Otherwise, you will receive a letter about your results with an explanation, but please check with MyChart first.

## 2016-05-06 NOTE — Telephone Encounter (Signed)
Patient stated she has experienced low b/s yesterday [id number] and left with a bad headach she said it has never happened before. please advise

## 2016-05-06 NOTE — ED Provider Notes (Signed)
[facility]-EMERGENCY DEPT Provider Note   CSN: [id number] Arrival date & time: 05/06/16  1416     History   Chief Complaint Chief Complaint  Patient presents with  . Dizziness  . Headache  . Urinary Frequency    HPI Julia Estrada is a 45 y.o. female.  Patient presents to the emergency department with chief complaint of headache, dizziness, and urinary frequency. She states that her symptoms started yesterday. She was seen by her primary care doctor today, and was referred to emergency department for further workup and evaluation. She also states that she has been having low blood sugars for the past 2 days, as low as 60. She states that normally her blood sugar does not run below 100. She states that she has been exercising and eating healthier recently, and states this may be a contributing factor. She states that her headache and dizziness symptoms have improved since this morning. She reports still having some urinary frequency. She also endorses some fevers and chills in the night. There are no other associated symptoms or modifying factors.   The history is provided by the patient. No language interpreter was used.    Past Medical History:  Diagnosis Date  . Anemia    iron deficiency  . Anxiety   . Depression 2011  . Diabetes mellitus 2010   type II  . GERD (gastroesophageal reflux disease) 2004  . Hyperlipidemia   . Hypertension 2008  . Obesity    BMI=44  . Shortness of breath    with exercise  . Sleep apnea    uses CPAP every night  . Vitamin B12 deficiency   . Vitamin D deficiency     Patient Active Problem List   Diagnosis Date Noted  . Vertigo 05/06/2016  . Fever 05/06/2016  . Urinary frequency 12/15/2015  . Muscle cramps 10/12/2015  . Wheezing 08/04/2015  . Cough 08/04/2015  . Well adult exam 05/12/2015  . Dyslipidemia 10/03/2014  . Allergic rhinitis 04/12/2014  . Mild persistent asthma 04/12/2014  . Dyspnea 04/12/2014  . Left shoulder pain 02/07/2014    . 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 Intertrigo 03/30/2012  . OSA on CPAP 03/30/2012  . GERD (gastroesophageal reflux disease) 04/12/2011  . Abdominal pain, epigastric 05/04/2010  . Unspecified eustachian tube disorder 02/23/2010  . OTITIS MEDIA, LEFT 02/23/2010  . INTERMITTENT VERTIGO 02/23/2010  . NAUSEA 01/18/2010  . LUQ PAIN 01/18/2010  . 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 CHEST PAIN 10/05/2009  . DEPRESSION 09/07/2009  . Obesity 07/17/2009  . Diabetes type 2, uncontrolled (HCC) 04/14/2009  . 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 ELEVATED BP 10/02/2008  . B12 deficiency 08/30/2007  . Vitamin D deficiency 08/30/2007  . HAIR LOSS 08/30/2007  . PARESTHESIA 08/30/2007  . WEIGHT GAIN 08/30/2007  . ANEMIA-IRON DEFICIENCY 05/28/2007    Past Surgical History:  Procedure Laterality Date  . CESAREAN SECTION  2004   x 1  . COLONOSCOPY    . DILITATION & CURRETTAGE/HYSTROSCOPY WITH NOVASURE ABLATION N/A 01/09/2015   Procedure: DILATATION & CURETTAGE/HYSTEROSCOPY WITH NOVASURE ABLATION;  Surgeon: Donovan Kail, MD;  Location: [facility];  Service: Gynecology;  Laterality: N/A;  . fibroidectomy  2002   laparotomy for removal of fibroids  . MYOMECTOMY  2002  . SHOULDER ARTHROSCOPY Left 04/30/2014   Procedure: Left Shoulder Arthroscopy and Debridement;  Surgeon: Nadara Mustard, MD;  Location: [facility];  Service: Orthopedics;  Laterality: Left;  . TONSILLECTOMY AND ADENOIDECTOMY    . UPPER GI ENDOSCOPY    .  WISDOM TOOTH EXTRACTION      OB History    No data available       Home Medications    Prior to Admission medications   Medication Sig Start Date End Date Taking? Authorizing Provider  albuterol (VENTOLIN HFA) 108 (90 Base) MCG/ACT inhaler Inhale 2 puffs into the lungs every 6 (six) hours as needed for wheezing or shortness of breath. 02/22/16   Corwin Levins, MD  azelastine (ASTELIN) 0.1 % nasal spray Place 1 spray into both nostrils 2 (two) times daily. Use in each nostril as directed    Historical  Provider, MD  cholecalciferol (VITAMIN D) 1000 UNITS tablet Take 1 tablet (1,000 Units total) by mouth daily. 05/12/15 05-15-16  Georgina Quint Plotnikov, MD  diphenhydrAMINE (BENADRYL) 25 MG tablet Take 1 tablet (25 mg total) by mouth every 6 (six) hours as needed for itching or allergies (hives). 03/31/14   Georgina Quint Plotnikov, MD  empagliflozin (JARDIANCE) 10 MG TABS tablet Take 25 mg by mouth daily.     Historical Provider, MD  esomeprazole (NEXIUM) 40 MG capsule Take 1 capsule (40 mg total) by mouth daily. 03/07/16   Betty G [place], MD  ferrous sulfate 325 (65 FE) MG tablet Take 325 mg by mouth daily with breakfast.    Historical Provider, MD  fexofenadine (ALLEGRA) 180 MG tablet Take 180 mg by mouth daily as needed for allergies.     Historical Provider, MD  fluconazole (DIFLUCAN) 150 MG tablet Take 1 tablet (150 mg total) by mouth once. 02/17/16   Reather Littler, MD  fluticasone (FLONASE) 50 MCG/ACT nasal spray Place 2 sprays into both nostrils daily.    Historical Provider, MD  glucose blood (ONETOUCH VERIO) test strip Use as instructed to check blood sugar twice a day dx code E11.65 06/05/15   Reather Littler, MD  ibuprofen (ADVIL,MOTRIN) 800 MG tablet Take 800 mg by mouth every 8 (eight) hours as needed for fever, headache or mild pain.     Historical Provider, MD  JARDIANCE 25 MG TABS tablet Take 1 tablet by mouth  daily 02/15/16   Reather Littler, MD  levofloxacin (LEVAQUIN) 500 MG tablet Take 1 tablet (500 mg total) by mouth daily. 05/06/16 05/16/16  Corwin Levins, MD  meclizine (ANTIVERT) 12.5 MG tablet Take 1 tablet (12.5 mg total) by mouth 3 (three) times daily as needed for dizziness. 05/06/16 05/06/17  Corwin Levins, MD  metFORMIN (GLUCOPHAGE-XR) 500 MG 24 hr tablet Take 4 tablets by mouth  daily 01/12/16   Reather Littler, MD  montelukast (SINGULAIR) 10 MG tablet Take 1 tablet by mouth at  bedtime 02/15/16   Cristal Ford, MD  olmesartan (BENICAR) 40 MG tablet Take 1 tablet (40 mg total) by mouth daily. 02/20/16    Tresa Garter, MD  [facility] DELICA LANCETS FINE MISC Use 2 per day dx code E11.65 06/05/15   Reather Littler, MD  PREVIFEM 0.25-35 MG-MCG tablet Take 1 tablet by mouth daily. 11/19/15   Historical Provider, MD  ranitidine (ZANTAC) 300 MG tablet Take 1 tablet by mouth at  bedtime 06/10/15   Tresa Garter, MD  simvastatin (ZOCOR) 20 MG tablet Take 1 tablet by mouth at  bedtime 01/12/16   Reather Littler, MD  TANZEUM 50 MG PEN Inject the contents of one  pen once per week 02/19/16   Reather Littler, MD  triamcinolone cream (KENALOG) 0.5 % Apply 1 application topically 3 (three) times daily. On rash 03/31/14   Georgina Quint Plotnikov,  MD  valACYclovir (VALTREX) 1000 MG tablet Take 1,000 mg by mouth daily.  02/02/14   Historical Provider, MD    Family History Family History  Problem Relation Age of Onset  . Hypertension Mother     father, sisters  . Diabetes Mother     sister  . Kidney cancer Mother   . Cancer Mother   . Cholelithiasis Sister   . Allergic rhinitis Sister   . Clotting disorder Daughter   . Urticaria Daughter   . Heart disease Maternal Uncle   . Heart disease Maternal Grandfather     Social History Social History  Substance Use Topics  . Smoking status: Never Smoker  . Smokeless tobacco: Never Used  . Alcohol use No     Comment: occasional     Allergies   Codeine sulfate; Hydrochlorothiazide w-triamterene; Oxycodone-acetaminophen; Prednisone; Spironolactone; and Victoza [liraglutide]   Review of Systems Review of Systems  Genitourinary: Positive for frequency.  Neurological: Positive for dizziness.  All other systems reviewed and are negative.    Physical Exam Updated Vital Signs BP 140/79   Pulse 99   Temp 99.2 F (37.3 C) (Oral)   Resp 18   Ht 5\' 6"  (1.676 m)   Wt 122.5 kg   LMP 04/20/2016 (Approximate)   SpO2 100%   BMI 43.58 kg/m   Physical Exam  Constitutional: She is oriented to person, place, and time. She appears well-developed and well-nourished.    HENT:  Head: Normocephalic and atraumatic.  Eyes: Conjunctivae and EOM are normal. Pupils are equal, round, and reactive to light.  Neck: Normal range of motion. Neck supple.  Cardiovascular: Normal rate and regular rhythm.  Exam reveals no gallop and no friction rub.   No murmur heard. Pulmonary/Chest: Effort normal and breath sounds normal. No respiratory distress. She has no wheezes. She has no rales. She exhibits no tenderness.  Abdominal: Soft. Bowel sounds are normal. She exhibits no distension and no mass. There is no tenderness. There is no rebound and no guarding.  Musculoskeletal: Normal range of motion. She exhibits no edema or tenderness.  Neurological: She is alert and oriented to person, place, and time.  Skin: Skin is warm and dry.  Psychiatric: She has a normal mood and affect. Her behavior is normal. Judgment and thought content normal.  Nursing note and vitals reviewed.    ED Treatments / Results  Labs (all labs ordered are listed, but only abnormal results are displayed) Labs Reviewed  BASIC METABOLIC PANEL - Abnormal; Notable for the following:       Result Value   Glucose, Bld 109 (*)    All other components within normal limits  CBC - Abnormal; Notable for the following:    Hemoglobin 10.6 (*)    HCT 35.1 (*)    MCV 68.8 (*)    MCH 20.8 (*)    RDW 19.2 (*)    All other components within normal limits  URINALYSIS, ROUTINE W REFLEX MICROSCOPIC (NOT AT [facility])  CBG MONITORING, ED  I-STAT BETA HCG BLOOD, ED ([facility], [facility], [facility] ONLY)    EKG  EKG Interpretation  Date/Time:  Friday May 06 2016 14:40:46 EDT Ventricular Rate:  99 PR Interval:    QRS Duration: 140 QT Interval:  358 QTC Calculation: 460 R Axis:   73 Text Interpretation:  Sinus rhythm Probable left atrial enlargement Right bundle branch block --new Confirmed by Fayrene FearingJAMES  MD, MARK ([id number]) on 05/06/2016 4:06:56 PM       Radiology Dg  Chest 2 View  Result Date: 05/06/2016 CLINICAL DATA:  Dyspnea,  fever. EXAM: CHEST  2 VIEW COMPARISON:  Radiographs of August 05, 2015. FINDINGS: The heart size and mediastinal contours are within normal limits. Both lungs are clear. No pneumothorax or pleural effusion is noted. Mild anterior osteophyte formation is noted in thoracic spine. IMPRESSION: No active cardiopulmonary disease. Electronically Signed   By: Lupita Raider, M.D.   On: 05/06/2016 14:47    Procedures Procedures (including critical care time)  Medications Ordered in ED Medications - No data to display   Initial Impression / Assessment and Plan / ED Course  I have reviewed the triage vital signs and the nursing notes.  Pertinent labs & imaging results that were available during my care of the patient were reviewed by me and considered in my medical decision making (see chart for details).  Clinical Course    Patient with dizziness, headache, urinary frequency. She states that her dizziness and headache have mostly resolved since being in the emergency department. She was seen by her primary care doctor earlier, and was sent for further workup and evaluation. Etiology of patient's symptoms was not clear. I agree that the symptoms could be viral versus UTI. Her blood glucoses normal today. We'll check orthostatic vital signs. She may benefit from resting at home and increasing fluids.  Labs, orthostatic vital signs, or are reassuring today. Patient's symptoms are likely viral. Recommend primary care follow-up. She is stable and ready for discharge.  6:34 PM Patient is requesting discharge.  Final Clinical Impressions(s) / ED Diagnoses   Final diagnoses:  Dizziness  Urinary frequency    New Prescriptions New Prescriptions   No medications on file     Roxy Horseman, PA-C 05/06/16 1834    Nira Conn, MD 05/07/16 0157

## 2016-05-06 NOTE — Assessment & Plan Note (Signed)
etilogy unclear, ? Viral, but at least mild ill today, for labs, UA, cxr and empiric levaquin

## 2016-05-06 NOTE — ED Notes (Signed)
Discharge instructions and follow up care reviewed with patient. Patient verbalized understanding. 

## 2016-05-06 NOTE — Progress Notes (Signed)
Pre visit review using our clinic review tool, if applicable. No additional management support is needed unless otherwise documented below in the visit note. 

## 2016-05-06 NOTE — Assessment & Plan Note (Signed)
C/w peripheral type,f or meclizine prn,  to f/u any worsening symptoms or concerns 

## 2016-05-06 NOTE — Telephone Encounter (Signed)
Please advise on what I should tell this patient

## 2016-05-06 NOTE — ED Triage Notes (Signed)
With continued triage pt complaint of hypoglycemia and chills.

## 2016-05-06 NOTE — Assessment & Plan Note (Signed)
Doubt OAB or DM related with the lower sugars, etiology unclear, for UA and Cx as cant r/o UTI

## 2016-05-06 NOTE — ED Triage Notes (Signed)
Per EMS pt complaint of dizziness, headache, and urinary frequency onset yesterday.

## 2016-05-06 NOTE — Assessment & Plan Note (Signed)
Exam without specific findings, for cxr given illness, r/o pna,  to f/u any worsening symptoms or concerns

## 2016-05-06 NOTE — Telephone Encounter (Signed)
Left a message for patient to switch metformin to 3 tablets in the evenings only.  Need to confirm what time she had a low sugar and how she was feeling at that time

## 2016-05-07 LAB — PREGNANCY, URINE: PREG TEST UR: NEGATIVE

## 2016-05-08 ENCOUNTER — Emergency Department (HOSPITAL_COMMUNITY): Payer: 59

## 2016-05-08 ENCOUNTER — Inpatient Hospital Stay (HOSPITAL_COMMUNITY)
Admission: EM | Admit: 2016-05-08 | Discharge: 2016-05-10 | DRG: 441 | Disposition: A | Discharge status: Other Acute Inpatient Hospital | Payer: 59 | Attending: Emergency Medicine | Admitting: Emergency Medicine

## 2016-05-08 ENCOUNTER — Inpatient Hospital Stay (HOSPITAL_COMMUNITY): Payer: 59

## 2016-05-08 ENCOUNTER — Encounter (HOSPITAL_COMMUNITY): Payer: Self-pay

## 2016-05-08 DIAGNOSIS — E11649 Type 2 diabetes mellitus with hypoglycemia without coma: Secondary | ICD-10-CM | POA: Diagnosis present

## 2016-05-08 DIAGNOSIS — K7290 Hepatic failure, unspecified without coma: Secondary | ICD-10-CM | POA: Diagnosis not present

## 2016-05-08 DIAGNOSIS — R579 Shock, unspecified: Secondary | ICD-10-CM | POA: Diagnosis present

## 2016-05-08 DIAGNOSIS — A419 Sepsis, unspecified organism: Secondary | ICD-10-CM | POA: Diagnosis not present

## 2016-05-08 DIAGNOSIS — R571 Hypovolemic shock: Secondary | ICD-10-CM | POA: Diagnosis present

## 2016-05-08 DIAGNOSIS — G4733 Obstructive sleep apnea (adult) (pediatric): Secondary | ICD-10-CM | POA: Diagnosis present

## 2016-05-08 DIAGNOSIS — E872 Acidosis, unspecified: Secondary | ICD-10-CM

## 2016-05-08 DIAGNOSIS — D696 Thrombocytopenia, unspecified: Secondary | ICD-10-CM | POA: Diagnosis present

## 2016-05-08 DIAGNOSIS — Z6841 Body Mass Index (BMI) 40.0 and over, adult: Secondary | ICD-10-CM | POA: Diagnosis not present

## 2016-05-08 DIAGNOSIS — I950 Idiopathic hypotension: Secondary | ICD-10-CM

## 2016-05-08 DIAGNOSIS — S36119A Unspecified injury of liver, initial encounter: Secondary | ICD-10-CM | POA: Diagnosis present

## 2016-05-08 DIAGNOSIS — Z7984 Long term (current) use of oral hypoglycemic drugs: Secondary | ICD-10-CM

## 2016-05-08 DIAGNOSIS — N19 Unspecified kidney failure: Secondary | ICD-10-CM

## 2016-05-08 DIAGNOSIS — J9601 Acute respiratory failure with hypoxia: Secondary | ICD-10-CM | POA: Diagnosis present

## 2016-05-08 DIAGNOSIS — K759 Inflammatory liver disease, unspecified: Secondary | ICD-10-CM

## 2016-05-08 DIAGNOSIS — Z833 Family history of diabetes mellitus: Secondary | ICD-10-CM

## 2016-05-08 DIAGNOSIS — D689 Coagulation defect, unspecified: Secondary | ICD-10-CM | POA: Diagnosis present

## 2016-05-08 DIAGNOSIS — E669 Obesity, unspecified: Secondary | ICD-10-CM | POA: Diagnosis present

## 2016-05-08 DIAGNOSIS — I10 Essential (primary) hypertension: Secondary | ICD-10-CM | POA: Diagnosis present

## 2016-05-08 DIAGNOSIS — K7200 Acute and subacute hepatic failure without coma: Secondary | ICD-10-CM | POA: Diagnosis not present

## 2016-05-08 DIAGNOSIS — N179 Acute kidney failure, unspecified: Secondary | ICD-10-CM

## 2016-05-08 DIAGNOSIS — D509 Iron deficiency anemia, unspecified: Secondary | ICD-10-CM | POA: Diagnosis present

## 2016-05-08 DIAGNOSIS — E559 Vitamin D deficiency, unspecified: Secondary | ICD-10-CM | POA: Diagnosis present

## 2016-05-08 DIAGNOSIS — K7201 Acute and subacute hepatic failure with coma: Secondary | ICD-10-CM | POA: Diagnosis not present

## 2016-05-08 DIAGNOSIS — E785 Hyperlipidemia, unspecified: Secondary | ICD-10-CM | POA: Diagnosis present

## 2016-05-08 DIAGNOSIS — K219 Gastro-esophageal reflux disease without esophagitis: Secondary | ICD-10-CM | POA: Diagnosis present

## 2016-05-08 DIAGNOSIS — Z8249 Family history of ischemic heart disease and other diseases of the circulatory system: Secondary | ICD-10-CM

## 2016-05-08 DIAGNOSIS — N170 Acute kidney failure with tubular necrosis: Secondary | ICD-10-CM | POA: Diagnosis not present

## 2016-05-08 DIAGNOSIS — Z8051 Family history of malignant neoplasm of kidney: Secondary | ICD-10-CM | POA: Diagnosis not present

## 2016-05-08 DIAGNOSIS — R531 Weakness: Secondary | ICD-10-CM | POA: Diagnosis present

## 2016-05-08 DIAGNOSIS — Z832 Family history of diseases of the blood and blood-forming organs and certain disorders involving the immune mechanism: Secondary | ICD-10-CM | POA: Diagnosis not present

## 2016-05-08 DIAGNOSIS — Z01818 Encounter for other preprocedural examination: Secondary | ICD-10-CM

## 2016-05-08 DIAGNOSIS — G9340 Encephalopathy, unspecified: Secondary | ICD-10-CM

## 2016-05-08 DIAGNOSIS — E162 Hypoglycemia, unspecified: Secondary | ICD-10-CM | POA: Diagnosis not present

## 2016-05-08 LAB — URINE MICROSCOPIC-ADD ON

## 2016-05-08 LAB — POCT I-STAT 3, ART BLOOD GAS (G3+)
ACID-BASE DEFICIT: 21 mmol/L — AB (ref 0.0–2.0)
ACID-BASE DEFICIT: 15 mmol/L — AB (ref 0.0–2.0)
BICARBONATE: 5.8 mmol/L — AB (ref 20.0–28.0)
BICARBONATE: 10.2 mmol/L — AB (ref 20.0–28.0)
O2 SAT: 98 %
O2 SAT: 97 %
PH ART: 7.272 — AB (ref 7.350–7.450)
PO2 ART: 121 mmHg — AB (ref 83.0–108.0)
PO2 ART: 103 mmHg (ref 83.0–108.0)
Patient temperature: 97.5
Patient temperature: 97.9
TCO2: 6 mmol/L (ref 0–100)
TCO2: 11 mmol/L (ref 0–100)
pCO2 arterial: 17.2 mmHg — CL (ref 32.0–48.0)
pCO2 arterial: 22 mmHg — ABNORMAL LOW (ref 32.0–48.0)
pH, Arterial: 7.135 — CL (ref 7.350–7.450)

## 2016-05-08 LAB — I-STAT CHEM 8, ED
BUN: 47 mg/dL — ABNORMAL HIGH (ref 6–20)
CREATININE: 5.4 mg/dL — AB (ref 0.44–1.00)
Calcium, Ion: 0.83 mmol/L — CL (ref 1.15–1.40)
Chloride: 99 mmol/L — ABNORMAL LOW (ref 101–111)
GLUCOSE: 142 mg/dL — AB (ref 65–99)
HEMATOCRIT: 36 % (ref 36.0–46.0)
HEMOGLOBIN: 12.2 g/dL (ref 12.0–15.0)
POTASSIUM: 4.6 mmol/L (ref 3.5–5.1)
Sodium: 128 mmol/L — ABNORMAL LOW (ref 135–145)
TCO2: 9 mmol/L (ref 0–100)

## 2016-05-08 LAB — COMPREHENSIVE METABOLIC PANEL
ALBUMIN: 2.5 g/dL — AB (ref 3.5–5.0)
ALK PHOS: 271 U/L — AB (ref 38–126)
ALT: 10671 U/L — AB (ref 14–54)
ALT: 8457 U/L — ABNORMAL HIGH (ref 14–54)
ANION GAP: 30 — AB (ref 5–15)
ANION GAP: 25 — AB (ref 5–15)
AST: >10000 U/L — ABNORMAL HIGH (ref 15–41)
AST: >10000 U/L — ABNORMAL HIGH (ref 15–41)
Albumin: 3.3 g/dL — ABNORMAL LOW (ref 3.5–5.0)
Alkaline Phosphatase: 229 U/L — ABNORMAL HIGH (ref 38–126)
BILIRUBIN TOTAL: 4.8 mg/dL — AB (ref 0.3–1.2)
BILIRUBIN TOTAL: 4.6 mg/dL — AB (ref 0.3–1.2)
BUN: 36 mg/dL — ABNORMAL HIGH (ref 6–20)
BUN: 34 mg/dL — ABNORMAL HIGH (ref 6–20)
CALCIUM: 8.6 mg/dL — AB (ref 8.9–10.3)
CO2: 8 mmol/L — AB (ref 22–32)
CO2: 7 mmol/L — ABNORMAL LOW (ref 22–32)
CREATININE: 4.73 mg/dL — AB (ref 0.44–1.00)
Calcium: 6.9 mg/dL — ABNORMAL LOW (ref 8.9–10.3)
Chloride: 96 mmol/L — ABNORMAL LOW (ref 101–111)
Chloride: 101 mmol/L (ref 101–111)
Creatinine, Ser: 4.68 mg/dL — ABNORMAL HIGH (ref 0.44–1.00)
GFR, EST AFRICAN AMERICAN: 12 mL/min — AB (ref >60)
GFR, EST AFRICAN AMERICAN: 12 mL/min — AB (ref >60)
GFR, EST NON AFRICAN AMERICAN: 10 mL/min — AB (ref >60)
GFR, EST NON AFRICAN AMERICAN: 10 mL/min — AB (ref >60)
Glucose, Bld: 47 mg/dL — ABNORMAL LOW (ref 65–99)
Glucose, Bld: 87 mg/dL (ref 65–99)
POTASSIUM: 5.8 mmol/L — AB (ref 3.5–5.1)
Potassium: 4.4 mmol/L (ref 3.5–5.1)
Sodium: 134 mmol/L — ABNORMAL LOW (ref 135–145)
Sodium: 133 mmol/L — ABNORMAL LOW (ref 135–145)
TOTAL PROTEIN: 7 g/dL (ref 6.5–8.1)
TOTAL PROTEIN: 5.4 g/dL — AB (ref 6.5–8.1)

## 2016-05-08 LAB — CBC WITH DIFFERENTIAL/PLATELET
BASOS PCT: 1 %
Basophils Absolute: 0.1 10*3/uL (ref 0.0–0.1)
EOS PCT: 0 %
Eosinophils Absolute: 0 10*3/uL (ref 0.0–0.7)
HEMATOCRIT: 30.1 % — AB (ref 36.0–46.0)
Hemoglobin: 8.8 g/dL — ABNORMAL LOW (ref 12.0–15.0)
LYMPHS PCT: 15 %
Lymphs Abs: 1.8 10*3/uL (ref 0.7–4.0)
MCH: 20.7 pg — AB (ref 26.0–34.0)
MCHC: 29.2 g/dL — ABNORMAL LOW (ref 30.0–36.0)
MCV: 70.8 fL — AB (ref 78.0–100.0)
Monocytes Absolute: 0.7 10*3/uL (ref 0.1–1.0)
Monocytes Relative: 6 %
NEUTROS ABS: 9.1 10*3/uL — AB (ref 1.7–7.7)
NEUTROS PCT: 78 %
Platelets: 58 10*3/uL — ABNORMAL LOW (ref 150–400)
RBC: 4.25 MIL/uL (ref 3.87–5.11)
RDW: 20 % — ABNORMAL HIGH (ref 11.5–15.5)
WBC: 11.7 10*3/uL — ABNORMAL HIGH (ref 4.0–10.5)

## 2016-05-08 LAB — DIFFERENTIAL
BASOS ABS: 0.2 10*3/uL — AB (ref 0.0–0.1)
BASOS PCT: 1 %
EOS ABS: 0 10*3/uL (ref 0.0–0.7)
Eosinophils Relative: 0 %
LYMPHS ABS: 1.9 10*3/uL (ref 0.7–4.0)
LYMPHS PCT: 14 %
MONO ABS: 0.8 10*3/uL (ref 0.1–1.0)
Monocytes Relative: 6 %
NEUTROS PCT: 79 %
Neutro Abs: 10.3 10*3/uL — ABNORMAL HIGH (ref 1.7–7.7)
WBC Morphology: INCREASED

## 2016-05-08 LAB — OSMOLALITY: OSMOLALITY: 302 mosm/kg — AB (ref 275–295)

## 2016-05-08 LAB — URINALYSIS, ROUTINE W REFLEX MICROSCOPIC
BILIRUBIN URINE: NEGATIVE
GLUCOSE, UA: 500 mg/dL — AB
KETONES UR: NEGATIVE mg/dL
Leukocytes, UA: NEGATIVE
Nitrite: NEGATIVE
Protein, ur: 100 mg/dL — AB
SPECIFIC GRAVITY, URINE: 1.025 (ref 1.005–1.030)
pH: 5 (ref 5.0–8.0)

## 2016-05-08 LAB — URINE CULTURE: Organism ID, Bacteria: NO GROWTH

## 2016-05-08 LAB — RAPID URINE DRUG SCREEN, HOSP PERFORMED
Amphetamines: NOT DETECTED
BARBITURATES: NOT DETECTED
BENZODIAZEPINES: NOT DETECTED
Cocaine: NOT DETECTED
Opiates: NOT DETECTED
Tetrahydrocannabinol: NOT DETECTED

## 2016-05-08 LAB — I-STAT VENOUS BLOOD GAS, ED
ACID-BASE DEFICIT: 23 mmol/L — AB (ref 0.0–2.0)
BICARBONATE: 5.9 mmol/L — AB (ref 20.0–28.0)
O2 SAT: 88 %
TCO2: 7 mmol/L (ref 0–100)
pCO2, Ven: 21.8 mmHg — ABNORMAL LOW (ref 44.0–60.0)
pH, Ven: 7.04 — CL (ref 7.250–7.430)
pO2, Ven: 77 mmHg — ABNORMAL HIGH (ref 32.0–45.0)

## 2016-05-08 LAB — SALICYLATE LEVEL: SALICYLATE LVL: 10.6 mg/dL (ref 2.8–30.0)

## 2016-05-08 LAB — I-STAT BETA HCG BLOOD, ED (MC, WL, AP ONLY)

## 2016-05-08 LAB — CBC
HEMATOCRIT: 28.1 % — AB (ref 36.0–46.0)
Hemoglobin: 8.2 g/dL — ABNORMAL LOW (ref 12.0–15.0)
MCH: 20.7 pg — AB (ref 26.0–34.0)
MCHC: 29.2 g/dL — AB (ref 30.0–36.0)
MCV: 71 fL — AB (ref 78.0–100.0)
Platelets: 50 10*3/uL — ABNORMAL LOW (ref 150–400)
RBC: 3.96 MIL/uL (ref 3.87–5.11)
RDW: 20.3 % — AB (ref 11.5–15.5)
WBC: 11.2 10*3/uL — ABNORMAL HIGH (ref 4.0–10.5)

## 2016-05-08 LAB — ACETAMINOPHEN LEVEL: Acetaminophen (Tylenol), Serum: <10 ug/mL — ABNORMAL LOW (ref 10–30)

## 2016-05-08 LAB — I-STAT CG4 LACTIC ACID, ED
LACTIC ACID, VENOUS: 16.08 mmol/L — AB (ref 0.5–1.9)
LACTIC ACID, VENOUS: 16.05 mmol/L — AB (ref 0.5–1.9)
Lactic Acid, Venous: >17 mmol/L (ref 0.5–1.9)

## 2016-05-08 LAB — GLUCOSE, CAPILLARY: Glucose-Capillary: 77 mg/dL (ref 65–99)

## 2016-05-08 LAB — VOLATILES,BLD-ACETONE,ETHANOL,ISOPROP,METHANOL
Acetone, blood: NOT DETECTED % (ref 0.000–0.010)
Ethanol, blood: NOT DETECTED % (ref 0.000–0.010)
ISOPROPANOL, BLOOD: NOT DETECTED % (ref 0.000–0.010)
Methanol, blood: NOT DETECTED % (ref 0.000–0.010)

## 2016-05-08 LAB — CK: Total CK: 237 U/L — ABNORMAL HIGH (ref 38–234)

## 2016-05-08 LAB — MRSA PCR SCREENING: MRSA BY PCR: NEGATIVE

## 2016-05-08 LAB — PROTIME-INR
INR: 6.92
Prothrombin Time: 62.1 seconds — ABNORMAL HIGH (ref 11.4–15.2)

## 2016-05-08 LAB — CG4 I-STAT (LACTIC ACID): Lactic Acid, Venous: 13.49 mmol/L (ref 0.5–1.9)

## 2016-05-08 LAB — LACTIC ACID, PLASMA: LACTIC ACID, VENOUS: 12.4 mmol/L — AB (ref 0.5–1.9)

## 2016-05-08 LAB — TYPE AND SCREEN
ABO/RH(D): O POS
ANTIBODY SCREEN: NEGATIVE

## 2016-05-08 LAB — CBG MONITORING, ED
GLUCOSE-CAPILLARY: 96 mg/dL (ref 65–99)
Glucose-Capillary: 45 mg/dL — ABNORMAL LOW (ref 65–99)
Glucose-Capillary: 129 mg/dL — ABNORMAL HIGH (ref 65–99)

## 2016-05-08 LAB — BRAIN NATRIURETIC PEPTIDE: B NATRIURETIC PEPTIDE 5: 103.7 pg/mL — AB (ref 0.0–100.0)

## 2016-05-08 LAB — TROPONIN I: Troponin I: 0.14 ng/mL (ref <0.03)

## 2016-05-08 MED ORDER — SODIUM BICARBONATE 8.4 % IV SOLN
INTRAVENOUS | Status: AC
  Filled 2016-05-08: qty 100

## 2016-05-08 MED ORDER — SODIUM CHLORIDE 0.9 % IV BOLUS (SEPSIS)
1000.0000 mL | Freq: Once | INTRAVENOUS | Status: AC
  Administered 2016-05-08: 1000 mL via INTRAVENOUS

## 2016-05-08 MED ORDER — VANCOMYCIN HCL 10 G IV SOLR: 1750.0000 mg | INTRAVENOUS | Status: DC

## 2016-05-08 MED ORDER — VANCOMYCIN HCL 10 G IV SOLR
2000.0000 mg | Freq: Once | INTRAVENOUS | Status: AC
  Administered 2016-05-08: 2000 mg via INTRAVENOUS
  Filled 2016-05-08: qty 2000

## 2016-05-08 MED ORDER — SODIUM BICARBONATE 8.4 % IV SOLN
100.0000 meq | Freq: Once | INTRAVENOUS | Status: AC
Start: 2016-05-08 — End: 2016-05-08
  Administered 2016-05-08: 100 meq via INTRAVENOUS

## 2016-05-08 MED ORDER — SODIUM CHLORIDE 0.9 % IV SOLN
2.0000 g | Freq: Once | INTRAVENOUS | Status: AC
  Administered 2016-05-08: 2 g via INTRAVENOUS
  Filled 2016-05-08: qty 20

## 2016-05-08 MED ORDER — PIPERACILLIN-TAZOBACTAM 3.375 G IVPB 30 MIN
3.3750 g | Freq: Once | INTRAVENOUS | Status: AC
  Administered 2016-05-08: 3.375 g via INTRAVENOUS
  Filled 2016-05-08: qty 50

## 2016-05-08 MED ORDER — DEXTROSE 50 % IV SOLN
INTRAVENOUS | Status: AC
  Filled 2016-05-08: qty 50

## 2016-05-08 MED ORDER — SODIUM POLYSTYRENE SULFONATE 15 GM/60ML PO SUSP
30.0000 g | Freq: Once | ORAL | Status: AC
Start: 2016-05-08 — End: 2016-05-08
  Administered 2016-05-08: 30 g via RECTAL
  Filled 2016-05-08: qty 120

## 2016-05-08 MED ORDER — PIPERACILLIN-TAZOBACTAM IN DEX 2-0.25 GM/50ML IV SOLN
2.2500 g | Freq: Three times a day (TID) | INTRAVENOUS | Status: DC
  Administered 2016-05-08 – 2016-05-09 (×4): 2.25 g via INTRAVENOUS
  Filled 2016-05-08 (×7): qty 50

## 2016-05-08 MED ORDER — FAMOTIDINE IN NACL 20-0.9 MG/50ML-% IV SOLN
20.0000 mg | Freq: Two times a day (BID) | INTRAVENOUS | Status: DC
  Administered 2016-05-08: 20 mg via INTRAVENOUS
  Filled 2016-05-08 (×2): qty 50

## 2016-05-08 MED ORDER — NOREPINEPHRINE BITARTRATE 1 MG/ML IV SOLN
2.0000 ug/min | INTRAVENOUS | Status: DC
  Administered 2016-05-08: 15 ug/min via INTRAVENOUS
  Administered 2016-05-08: 2 ug/min via INTRAVENOUS
  Filled 2016-05-08 (×2): qty 4

## 2016-05-08 MED ORDER — DEXTROSE 50 % IV SOLN
50.0000 mL | Freq: Once | INTRAVENOUS | Status: AC
  Administered 2016-05-08: 50 mL via INTRAVENOUS

## 2016-05-08 MED ORDER — ONDANSETRON HCL 4 MG/2ML IJ SOLN
4.0000 mg | Freq: Four times a day (QID) | INTRAMUSCULAR | Status: DC | PRN
  Administered 2016-05-08 – 2016-05-09 (×2): 4 mg via INTRAVENOUS
  Filled 2016-05-08 (×2): qty 2

## 2016-05-08 MED ORDER — STERILE WATER FOR INJECTION IV SOLN
INTRAVENOUS | Status: DC
  Administered 2016-05-08 – 2016-05-09 (×2): via INTRAVENOUS
  Filled 2016-05-08 (×3): qty 850

## 2016-05-08 MED ORDER — VITAMIN K1 10 MG/ML IJ SOLN
10.0000 mg | Freq: Once | INTRAMUSCULAR | Status: AC
  Administered 2016-05-08: 10 mg via INTRAVENOUS
  Filled 2016-05-08: qty 1

## 2016-05-08 MED ORDER — HEPARIN SODIUM (PORCINE) 5000 UNIT/ML IJ SOLN: 5000.0000 [IU] | Freq: Three times a day (TID) | INTRAMUSCULAR | Status: DC

## 2016-05-08 MED ORDER — SODIUM CHLORIDE 0.9 % IV SOLN: Freq: Once | INTRAVENOUS | Status: DC

## 2016-05-08 MED ORDER — DEXTROSE 50 % IV SOLN
1.0000 | Freq: Once | INTRAVENOUS | Status: AC
  Administered 2016-05-08: 50 mL via INTRAVENOUS
  Filled 2016-05-08: qty 50

## 2016-05-08 MED ORDER — VANCOMYCIN HCL IN DEXTROSE 1-5 GM/200ML-% IV SOLN: 1000.0000 mg | Freq: Once | INTRAVENOUS | Status: DC

## 2016-05-08 MED ORDER — INSULIN ASPART 100 UNIT/ML IV SOLN
10.0000 [IU] | Freq: Once | INTRAVENOUS | Status: AC
  Administered 2016-05-08: 10 [IU] via INTRAVENOUS

## 2016-05-08 NOTE — ED Notes (Signed)
Ultrasound at bedside

## 2016-05-08 NOTE — ED Notes (Signed)
MD at bedside. Placing central line. Consent obtained by dr. Criss Alvinegoldston

## 2016-05-08 NOTE — ED Notes (Signed)
Critical care MD at bedside 

## 2016-05-08 NOTE — Consult Note (Signed)
Consultation  Referring Provider:     Dr Delton Coombes (ICU) Primary Care Physician:  Sonda Primes, MD Primary Gastroenterologist:        Gentry Fitz Reason for Consultation:     Abnormal LFT            HPI:   Julia Estrada is a 45 y.o. female with history of diabetes, hypertension obstructive sleep apnea presented this afternoon to the ER with complaints of generalized body ache, fatigue, headache and weakness. She was in the ER at [facility] with similar complaints 2 days ago on October 6 and was sent home as was thought to be due to a viral illness. Per patient she has been laying in bed for the past 2 days with decreased appetite and wasn't eating or drinking much . Denies any one else sick in the household . No new medications or over-the-counter herbal remedies . Denies any recreational drug abuse . No history of Tylenol use. She has been taking ibuprofen 600 mg every 8 hours the past 2-3 days . In the ER noted to have severe hypotension with systolic blood pressure in the 60s, hypoglycemia with blood sugar 40 , creatinine 5.4, lactic acid greater than 17 and pH 7.04. Also noted transaminases greater than 10,000, alkaline phosphatase 271 and total bilirubin 4.8. CK also elevated to 237. She had mild elevation in AST 781 and ALT 925 on 10/6 Denies any diarrhea, melena or bright red blood per rectum. She had an episode of vomiting prior to presentation to ER, vomited undigested food from last night.    Past Medical History:  Diagnosis Date  . Anemia    iron deficiency  . Anxiety   . Depression 2011  . Diabetes mellitus 2010   type II  . GERD (gastroesophageal reflux disease) 2004  . Hyperlipidemia   . Hypertension 2008  . Obesity    BMI=44  . Shortness of breath    with exercise  . Sleep apnea    uses CPAP every night  . Vitamin B12 deficiency   . Vitamin D deficiency     Past Surgical History:  Procedure Laterality Date  . CESAREAN SECTION  2004   x 1  .  COLONOSCOPY    . DILITATION & CURRETTAGE/HYSTROSCOPY WITH NOVASURE ABLATION N/A 01/09/2015   Procedure: DILATATION & CURETTAGE/HYSTEROSCOPY WITH NOVASURE ABLATION;  Surgeon: Donovan Kail, MD;  Location: [facility];  Service: Gynecology;  Laterality: N/A;  . fibroidectomy  2002   laparotomy for removal of fibroids  . MYOMECTOMY  2002  . SHOULDER ARTHROSCOPY Left 04/30/2014   Procedure: Left Shoulder Arthroscopy and Debridement;  Surgeon: Nadara Mustard, MD;  Location: [facility] OR;  Service: Orthopedics;  Laterality: Left;  . TONSILLECTOMY AND ADENOIDECTOMY    . UPPER GI ENDOSCOPY    . WISDOM TOOTH EXTRACTION      Family History  Problem Relation Age of Onset  . Hypertension Mother     father, sisters  . Diabetes Mother     sister  . Kidney cancer Mother   . Cancer Mother   . Cholelithiasis Sister   . Allergic rhinitis Sister   . Clotting disorder Daughter   . Urticaria Daughter   . Heart disease Maternal Uncle   . Heart disease Maternal Grandfather      Social History  Substance Use Topics  . Smoking status: Never Smoker  . Smokeless tobacco: Never Used  . Alcohol use No     Comment: occasional  Prior to Admission medications   Medication Sig Start Date End Date Taking? Authorizing Provider  albuterol (VENTOLIN HFA) 108 (90 Base) MCG/ACT inhaler Inhale 2 puffs into the lungs every 6 (six) hours as needed for wheezing or shortness of breath. 02/22/16   Corwin LevinsJames W John, MD  cholecalciferol (VITAMIN D) 1000 UNITS tablet Take 1 tablet (1,000 Units total) by mouth daily. 05/12/15 2016/04/15  Georgina QuintAleksei V Plotnikov, MD  diphenhydrAMINE (BENADRYL) 25 MG tablet Take 1 tablet (25 mg total) by mouth every 6 (six) hours as needed for itching or allergies (hives). Patient not taking: Reported on 05/06/2016 03/31/14   Tresa GarterAleksei V Plotnikov, MD  esomeprazole (NEXIUM) 40 MG capsule Take 1 capsule (40 mg total) by mouth daily. Patient not taking: Reported on 05/06/2016 03/07/16   Betty G SwazilandJordan, MD  fluconazole  (DIFLUCAN) 150 MG tablet Take 1 tablet (150 mg total) by mouth once. Patient not taking: Reported on 05/06/2016 02/17/16   Reather LittlerAjay Kumar, MD  glucose blood (ONETOUCH VERIO) test strip Use as instructed to check blood sugar twice a day dx code E11.65 06/05/15   Reather LittlerAjay Kumar, MD  JARDIANCE 25 MG TABS tablet Take 1 tablet by mouth  daily 02/15/16   Reather LittlerAjay Kumar, MD  levofloxacin (LEVAQUIN) 500 MG tablet Take 1 tablet (500 mg total) by mouth daily. 05/06/16 05/16/16  Corwin LevinsJames W John, MD  loratadine (CLARITIN) 10 MG tablet Take 10 mg by mouth daily.    Historical Provider, MD  meclizine (ANTIVERT) 12.5 MG tablet Take 1 tablet (12.5 mg total) by mouth 3 (three) times daily as needed for dizziness. 05/06/16 05/06/17  Corwin LevinsJames W John, MD  metFORMIN (GLUCOPHAGE-XR) 500 MG 24 hr tablet Take 4 tablets by mouth  daily 01/12/16   Reather LittlerAjay Kumar, MD  montelukast (SINGULAIR) 10 MG tablet Take 1 tablet by mouth at  bedtime 02/15/16   Cristal Fordalph Carter Bobbitt, MD  norethindrone-ethinyl estradiol-iron (ESTROSTEP FE,TILIA FE,TRI-LEGEST FE) 1-20/1-30/1-35 MG-MCG tablet Take 1 tablet by mouth daily.    Historical Provider, MD  olmesartan (BENICAR) 40 MG tablet Take 1 tablet (40 mg total) by mouth daily. 02/20/16   Tresa GarterAleksei V Plotnikov, MD  [facility] LANCETS FINE MISC Use 2 per day dx code E11.65 06/05/15   Reather LittlerAjay Kumar, MD  pantoprazole (PROTONIX) 40 MG tablet Take 40 mg by mouth daily.  04/19/16   Historical Provider, MD  ranitidine (ZANTAC) 300 MG tablet Take 1 tablet by mouth at  bedtime 06/10/15   Tresa GarterAleksei V Plotnikov, MD  simvastatin (ZOCOR) 20 MG tablet Take 1 tablet by mouth at  bedtime 01/12/16   Reather LittlerAjay Kumar, MD  TANZEUM 50 MG PEN Inject the contents of one  pen once per week 02/19/16   Reather LittlerAjay Kumar, MD  triamcinolone cream (KENALOG) 0.5 % Apply 1 application topically 3 (three) times daily. On rash Patient not taking: Reported on 05/06/2016 03/31/14   Tresa GarterAleksei V Plotnikov, MD    Current Facility-Administered Medications  Medication Dose Route  Frequency Provider Last Rate Last Dose  . norepinephrine (LEVOPHED) 4 mg in dextrose 5 % 250 mL (0.016 mg/mL) infusion  2-50 mcg/min Intravenous Titrated Pricilla LovelessScott Goldston, MD 56.3 mL/hr at 05/08/16 1455 15 mcg/min at 05/08/16 1455  . piperacillin-tazobactam (ZOSYN) IVPB 2.25 g  2.25 g Intravenous Q8H Thuy D Dang, RPH      . sodium chloride 0.9 % bolus 1,000 mL  1,000 mL Intravenous Once Pricilla LovelessScott Goldston, MD      . Melene Muller[START ON 05/10/2016] vancomycin (VANCOCIN) 1,750 mg in sodium chloride 0.9 %  500 mL IVPB  1,750 mg Intravenous Q48H Gerilyn Nestle, [facility]       Current Outpatient Prescriptions  Medication Sig Dispense Refill  . albuterol (VENTOLIN HFA) 108 (90 Base) MCG/ACT inhaler Inhale 2 puffs into the lungs every 6 (six) hours as needed for wheezing or shortness of breath. 3 Inhaler 1  . cholecalciferol (VITAMIN D) 1000 UNITS tablet Take 1 tablet (1,000 Units total) by mouth daily. 100 tablet 3  . diphenhydrAMINE (BENADRYL) 25 MG tablet Take 1 tablet (25 mg total) by mouth every 6 (six) hours as needed for itching or allergies (hives). (Patient not taking: Reported on 05/06/2016) 100 tablet 1  . esomeprazole (NEXIUM) 40 MG capsule Take 1 capsule (40 mg total) by mouth daily. (Patient not taking: Reported on 05/06/2016) 30 capsule 0  . fluconazole (DIFLUCAN) 150 MG tablet Take 1 tablet (150 mg total) by mouth once. (Patient not taking: Reported on 05/06/2016) 1 tablet 1  . glucose blood (ONETOUCH VERIO) test strip Use as instructed to check blood sugar twice a day dx code E11.65 200 each 1  . JARDIANCE 25 MG TABS tablet Take 1 tablet by mouth  daily 90 tablet 1  . levofloxacin (LEVAQUIN) 500 MG tablet Take 1 tablet (500 mg total) by mouth daily. 10 tablet 0  . loratadine (CLARITIN) 10 MG tablet Take 10 mg by mouth daily.    . meclizine (ANTIVERT) 12.5 MG tablet Take 1 tablet (12.5 mg total) by mouth 3 (three) times daily as needed for dizziness. 30 tablet 1  . metFORMIN (GLUCOPHAGE-XR) 500 MG 24 hr tablet Take 4  tablets by mouth  daily 360 tablet 0  . montelukast (SINGULAIR) 10 MG tablet Take 1 tablet by mouth at  bedtime 90 tablet 1  . norethindrone-ethinyl estradiol-iron (ESTROSTEP FE,TILIA FE,TRI-LEGEST FE) 1-20/1-30/1-35 MG-MCG tablet Take 1 tablet by mouth daily.    Marland Kitchen olmesartan (BENICAR) 40 MG tablet Take 1 tablet (40 mg total) by mouth daily. 30 tablet 11  . ONETOUCH DELICA LANCETS FINE MISC Use 2 per day dx code E11.65 200 each 1  . pantoprazole (PROTONIX) 40 MG tablet Take 40 mg by mouth daily.     . ranitidine (ZANTAC) 300 MG tablet Take 1 tablet by mouth at  bedtime 90 tablet 3  . simvastatin (ZOCOR) 20 MG tablet Take 1 tablet by mouth at  bedtime 90 tablet 0  . TANZEUM 50 MG PEN Inject the contents of one  pen once per week 12 each 2  . triamcinolone cream (KENALOG) 0.5 % Apply 1 application topically 3 (three) times daily. On rash (Patient not taking: Reported on 05/06/2016) 90 g 1    Allergies as of 05/08/2016 - Review Complete 05/08/2016  Allergen Reaction Noted  . Codeine sulfate Nausea And Vomiting   . Hydrochlorothiazide w-triamterene    . Oxycodone-acetaminophen Nausea And Vomiting   . Prednisone Hives 04/22/2014  . Spironolactone  02/20/2016  . Victoza [liraglutide]  08/15/2012     Review of Systems:    This is positive for those things mentioned in the HPI, also positive for urinary frequency and uregncy. All other review of systems are negative.       Physical Exam:  Vital signs in last 24 hours: Temp:  [96.4 F (35.8 C)-97.5 F (36.4 C)] 96.4 F (35.8 C) (10/08 1313) Pulse Rate:  [91-108] 92 (10/08 1545) Resp:  [15-41] 25 (10/08 1545) BP: (58-102)/(30-60) 94/55 (10/08 1545) SpO2:  [97 %-100 %] 100 % (10/08 1545) Weight:  [270 lb (  122.5 kg)] 270 lb (122.5 kg) (10/08 1116)    General:  Obese , NAD Eyes:  anicteric. ENT:   Mouth and posterior pharynx free of lesions.  Lungs: B/l decreased breath sounds at base Heart:              S1S2, no rubs, murmurs,  gallops. Abdomen:  soft, non-tender, no hepatosplenomegaly, hernia, or mass and BS+.  Extremities:   no edema, cool clammy Skin   no rash. Neuro:  A&O x 3.  Psych:  appropriate mood and  Affect.   Data Reviewed:   LAB RESULTS:  Recent Labs  05/06/16 1219 05/06/16 1509 05/08/16 1301 05/08/16 1410  WBC 7.3 8.9  --  11.7*  HGB 10.3* 10.6* 12.2 8.8*  HCT 32.8* 35.1* 36.0 30.1*  PLT 356.0 338  --  58*   BMET  Recent Labs  05/06/16 1219 05/06/16 1509 05/08/16 1131 05/08/16 1301  NA 136 137 134* 128*  K 3.2* 3.6 4.4 4.6  CL 101 103 96* 99*  CO2 24 24 8*  --   GLUCOSE 142* 109* 47* 142*  BUN 12 13 36* 47*  CREATININE 0.82 0.83 4.73* 5.40*  CALCIUM 9.5 9.3 8.6*  --    LFT  Recent Labs  05/06/16 1219 05/08/16 1131  PROT 7.6 7.0  ALBUMIN 3.8 3.3*  AST 781* >10,000*  ALT 925* 10,671*  ALKPHOS 123* 271*  BILITOT 1.0 4.8*  BILIDIR 0.2  --    PT/INR No results for input(s): LABPROT, INR in the last 72 hours.  STUDIES: Dg Chest Portable 1 View  Result Date: 05/08/2016 CLINICAL DATA:  Shortness of breath and weakness.  Dizziness. EXAM: PORTABLE CHEST 1 VIEW COMPARISON:  May 06, 2016 FINDINGS: There is no edema or consolidation. Heart is upper normal in size with pulmonary vascularity within normal limits. No adenopathy. No pneumothorax. No bone lesions. IMPRESSION: No edema or consolidation.  Stable cardiac silhouette. Electronically Signed   By: Bretta Bang III M.D.   On: 05/08/2016 12:41     PREVIOUS ENDOSCOPIES:            None    Impression / Plan:   45 year old female with diabetes on metformin presented with severe hypotension, hypoglycemia, acute renal failure, metabolic acidosis and elevated transaminases Transaminases greater than 10,000 likely in the setting of profound hypotension and shock liver Follow-up INR and direct bilirubin level , based on exam and labs available so far does not appear to be in acute liver failure F/u serum and  urine tox panel to exclude any toxic ingestion Check serum Tylenol level Supportive care per CCM     Iona Beard , MD [phone] Mon-Fri 8a-5p [phone] after 5p, weekends, holidays  @  05/08/2016, 3:54 PM

## 2016-05-08 NOTE — H&P (Signed)
PULMONARY / CRITICAL CARE MEDICINE   Name: Julia Estrada MRN: [id number] DOB: 09/09/1970    ADMISSION DATE:  05/08/2016  REFERRING MD:  EDP Criss Alvine([place])   CHIEF COMPLAINT:  Lactic acidosis, AKI, hypotension   HISTORY OF PRESENT ILLNESS:   45yo female with hx DM on metformin, HTN, OSA presented 10/8 with 3 day hx vague c/o dizziness, fever, headache, urinary frequency, weakness which [facility]  At that time she had normal renal function, normal blood sugar and was sent home with presumed viral illness.  She did have abnormal LFT's at that time.  She returned 10/8 with c/o same with the addition of dyspnea.  In ER she had metabolic disarray with Scr 5.4, anion gap 30, lactate >17, pH 7.040, profound hypotension with SBP 60-70, severely abnormal LFT's (AST and ALT >10,000, bili 4.8) and hypoglycemia with CBG 40. ER placed femoral CVL (pt unable to lie flat), started levophed and PCCM called to admit to ICU.    PAST MEDICAL HISTORY :  She  has a past medical history of Anemia; Anxiety; Depression (2011); Diabetes mellitus (2010); GERD (gastroesophageal reflux disease) (2004); Hyperlipidemia; Hypertension (2008); Obesity; Shortness of breath; Sleep apnea; Vitamin B12 deficiency; and Vitamin D deficiency.  PAST SURGICAL HISTORY: She  has a past surgical history that includes fibroidectomy (2002); Tonsillectomy and adenoidectomy; Cesarean section (2004); Myomectomy (2002); Shoulder arthroscopy (Left, 04/30/2014); Wisdom tooth extraction; Upper gi endoscopy; Colonoscopy; and Dilatation & currettage/hysteroscopy with novasure ablation (N/A, 01/09/2015).  Allergies  Allergen Reactions  . Codeine Sulfate Nausea And Vomiting  . Hydrochlorothiazide W-Triamterene     REACTION: cramping  . Oxycodone-Acetaminophen Nausea And Vomiting  . Prednisone Hives  . Spironolactone     Cramps   . Victoza [Liraglutide]     n/v    No current facility-administered medications on file  prior to encounter.    Current Outpatient Prescriptions on File Prior to Encounter  Medication Sig  . albuterol (VENTOLIN HFA) 108 (90 Base) MCG/ACT inhaler Inhale 2 puffs into the lungs every 6 (six) hours as needed for wheezing or shortness of breath.  . cholecalciferol (VITAMIN D) 1000 UNITS tablet Take 1 tablet (1,000 Units total) by mouth daily.  . diphenhydrAMINE (BENADRYL) 25 MG tablet Take 1 tablet (25 mg total) by mouth every 6 (six) hours as needed for itching or allergies (hives). (Patient not taking: Reported on 05/06/2016)  . esomeprazole (NEXIUM) 40 MG capsule Take 1 capsule (40 mg total) by mouth daily. (Patient not taking: Reported on 05/06/2016)  . fluconazole (DIFLUCAN) 150 MG tablet Take 1 tablet (150 mg total) by mouth once. (Patient not taking: Reported on 05/06/2016)  . glucose blood (ONETOUCH VERIO) test strip Use as instructed to check blood sugar twice a day dx code E11.65  . JARDIANCE 25 MG TABS tablet Take 1 tablet by mouth  daily  . levofloxacin (LEVAQUIN) 500 MG tablet Take 1 tablet (500 mg total) by mouth daily.  Marland Kitchen. loratadine (CLARITIN) 10 MG tablet Take 10 mg by mouth daily.  . meclizine (ANTIVERT) 12.5 MG tablet Take 1 tablet (12.5 mg total) by mouth 3 (three) times daily as needed for dizziness.  . metFORMIN (GLUCOPHAGE-XR) 500 MG 24 hr tablet Take 4 tablets by mouth  daily  . montelukast (SINGULAIR) 10 MG tablet Take 1 tablet by mouth at  bedtime  . norethindrone-ethinyl estradiol-iron (ESTROSTEP FE,TILIA FE,TRI-LEGEST FE) 1-20/1-30/1-35 MG-MCG tablet Take 1 tablet by mouth daily.  Marland Kitchen. olmesartan (BENICAR) 40 MG tablet Take  1 tablet (40 mg total) by mouth daily.  Letta Pate DELICA LANCETS FINE MISC Use 2 per day dx code E11.65  . pantoprazole (PROTONIX) 40 MG tablet Take 40 mg by mouth daily.   . ranitidine (ZANTAC) 300 MG tablet Take 1 tablet by mouth at  bedtime  . simvastatin (ZOCOR) 20 MG tablet Take 1 tablet by mouth at  bedtime  . TANZEUM 50 MG PEN Inject the  contents of one  pen once per week  . triamcinolone cream (KENALOG) 0.5 % Apply 1 application topically 3 (three) times daily. On rash (Patient not taking: Reported on 05/06/2016)    FAMILY HISTORY:  Her indicated that her mother is alive. She indicated that her father is alive. She indicated that the status of her sister is unknown. She indicated that the status of her maternal grandfather is unknown. She indicated that the status of her daughter is unknown. She indicated that the status of her maternal uncle is unknown.    SOCIAL HISTORY: She  reports that she has never smoked. She has never used smokeless tobacco. She reports that she does not drink alcohol or use drugs.  REVIEW OF SYSTEMS:   As per HPI - All other systems reviewed and were neg.    SUBJECTIVE:   VITAL SIGNS: BP (!) 69/30   Pulse 94   Temp (!) 96.4 F (35.8 C) (Rectal)   Resp (!) 27   Ht 5\' 6"  (1.676 m)   Wt 122.5 kg (270 lb)   LMP 04/20/2016 (Approximate)   SpO2 100%   BMI 43.58 kg/m   HEMODYNAMICS:    VENTILATOR SETTINGS:    INTAKE / OUTPUT: No intake/output data recorded.  PHYSICAL EXAMINATION: General:  Acutely ill appearing female, uncomfortable appearing but NAD  Neuro:  Awake, alert, appropriate, MAE  HEENT:  Mm dry, no JVD  Cardiovascular:  s1s2 rrr  Lungs:  resps even mildly labored on [place], diminished, few scattered crackles  Abdomen:  Obese, soft, no hepatosplenomegaly, hypoactive bs  Musculoskeletal:  Warm and dry, no sig edema   LABS:  BMET  Recent Labs Lab 05/06/16 1219 05/06/16 1509 05/08/16 1131 05/08/16 1301  NA 136 137 134* 128*  K 3.2* 3.6 4.4 4.6  CL 101 103 96* 99*  CO2 24 24 8*  --   BUN 12 13 36* 47*  CREATININE 0.82 0.83 4.73* 5.40*  GLUCOSE 142* 109* 47* 142*    Electrolytes  Recent Labs Lab 05/06/16 1219 05/06/16 1509 05/08/16 1131  CALCIUM 9.5 9.3 8.6*    CBC  Recent Labs Lab 05/06/16 1219 05/06/16 1509 05/08/16 1301  WBC 7.3 8.9  --   HGB  10.3* 10.6* 12.2  HCT 32.8* 35.1* 36.0  PLT 356.0 338  --     Coag's No results for input(s): APTT, INR in the last 168 hours.  Sepsis Markers  Recent Labs Lab 05/08/16 1147 05/08/16 1300 05/08/16 1421  LATICACIDVEN 16.08* >17.00* 16.05*    ABG No results for input(s): PHART, PCO2ART, PO2ART in the last 168 hours.  Liver Enzymes  Recent Labs Lab 05/06/16 1219 05/08/16 1131  AST 781* >10,000*  ALT 925* 10,671*  ALKPHOS 123* 271*  BILITOT 1.0 4.8*  ALBUMIN 3.8 3.3*    Cardiac Enzymes No results for input(s): TROPONINI, PROBNP in the last 168 hours.  Glucose  Recent Labs Lab 05/06/16 1439 05/08/16 1217 05/08/16 1303  GLUCAP 96 45* 129*    Imaging Dg Chest Portable 1 View  Result Date: 05/08/2016 CLINICAL DATA:  Shortness of breath and weakness.  Dizziness. EXAM: PORTABLE CHEST 1 VIEW COMPARISON:  May 06, 2016 FINDINGS: There is no edema or consolidation. Heart is upper normal in size with pulmonary vascularity within normal limits. No adenopathy. No pneumothorax. No bone lesions. IMPRESSION: No edema or consolidation.  Stable cardiac silhouette. Electronically Signed   By: Bretta Bang III M.D.   On: 05/08/2016 12:41     STUDIES:  abd u/s 10/8>>>  CULTURES: BC x 2 10/8>>> Urine 10/8>>>  ANTIBIOTICS: Vanc 10/8>>> Zosyn 10/8>>>  SIGNIFICANT EVENTS:   LINES/TUBES:   DISCUSSION: 45yo female with hx DM on metformin admitted 10/8 with profound metabolic acidosis, acute renal failure, acute liver failure.    ASSESSMENT / PLAN:  PULMONARY Dyspnea - respiratory compensation  Hypoxia  P:   Supplemental O2  F/u ABG  Tenuous - may ultimately require intubation  F/u CXR  CARDIOVASCULAR Hypotension/shock - unclear etiology - hypovolemia in setting prolonged viral illness v sepsis although no clear source.   Lactic acidosis - profoundly elevated lactate worsened by inability to clear lactate due to shock liver  HTN  P:  Volume  resuscitation  Levophed as needed to keep MAP >65 Hold all home meds  Troponin, BNP pending   RENAL AKI  Profound metabolic acidosis - worsened by metformin.  Also on Jardiance which carries warning for sudden kidney failure.  P:   Aggressive volume  F/u chem  CK  U/a  Urine osm    GASTROINTESTINAL Liver failure - AST/ALT >10,000, bili 4.8 P:   Stat abd u/s  GI to see  Check tylenol level although pt denies  NPO   HEMATOLOGIC CBC pending  P:  Check coags  SCD's for now   INFECTIOUS Shock - unclear etiology.  Hypovolemia v sepsis P:   Pan culture  Empiric abx for now   ENDOCRINE DM - on metformin    P:   SSI  Hold metformin  NEUROLOGIC No active issue  P:   Supportive care    FAMILY  - Updates:  Pt updated 10/8  - Inter-disciplinary family meet or Palliative Care meeting due by:  10/15    Dirk Dress, NP 05/08/2016  2:49 PM Pager: [phone] or [phone]  Attending Note:  I have examined patient, reviewed labs, studies and notes. I have discussed the case with Jasper Riling, and I agree with the data and plans as amended above. 45 yo woman with DM, HTN, OSA. Presents to the ED with dizziness, HA, weakness and was found to be in shock with multiple significant metabolic derangements and a severe transaminitis. She was seen as outpt 10/6 and in ED 10/6 for similar complaints. BP at the time was normal as was renal fxn. AST 781, ALT 925 on 10/6 with a normal BP and S cr. On eval in the ED she is uncomfortable, tachypneic. She cannot lay flat due to dyspnea. Lungs are clear. CV tachycardic but regular. ABG shos severe AG metabolic acidosis. Unless she turns around quickly with IVF she will need to be intubated as I do not believe she will be able to sustain WOB. Etiology of her shock, acute renal failure and profound transaminitis is unclear. The degree of elevation of the transaminases suggests med toxicity or shock liver. There is no hx tylenol  ingestion. No clear infectious focus seen - cultures obtained and empiric abx given in the ED. We will give IVF, start norepi, support aggressively. UDS, tylenol level sent.  Independent critical care  time is 60 minutes.   Levy Pupa, MD, PhD 05/08/2016, 3:03 PM [facility] [phone] or if no answer [phone]

## 2016-05-08 NOTE — ED Provider Notes (Signed)
[facility]-EMERGENCY DEPT Provider Note   CSN: [id number] Arrival date & time: 05/08/16  1102     History   Chief Complaint Chief Complaint  Patient presents with  . Weakness    HPI Julia Estrada is a 45 y.o. female.  HPI  45 year old female with a history of diabetes and sleep apnea presents with shortness of breath and weakness. About 3 days ago started having dizziness, headache, urinary frequency, and weakness. Seen a [facility] the next day without any obvious diagnosis. Since then has had progressive shortness of breath, dizziness, and diffuse fatigue. She states her urine is actually back to normal. There is no cough. Headache is gone. No chest pain. She has not had any fevers. The first day she had a glucose of 65 and felt a little bit better once she ate some food.  Past Medical History:  Diagnosis Date  . Anemia    iron deficiency  . Anxiety   . Depression 2011  . Diabetes mellitus 2010   type II  . GERD (gastroesophageal reflux disease) 2004  . Hyperlipidemia   . Hypertension 2008  . Obesity    BMI=44  . Shortness of breath    with exercise  . Sleep apnea    uses CPAP every night  . Vitamin B12 deficiency   . Vitamin D deficiency     Patient Active Problem List   Diagnosis Date Noted  . Vertigo 05/06/2016  . Fever 05/06/2016  . Urinary frequency 12/15/2015  . Muscle cramps 10/12/2015  . Wheezing 08/04/2015  . Cough 08/04/2015  . Well adult exam 05/12/2015  . Dyslipidemia 10/03/2014  . Allergic rhinitis 04/12/2014  . Mild persistent asthma 04/12/2014  . Dyspnea 04/12/2014  . Left shoulder pain 02/07/2014  . 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 Intertrigo 03/30/2012  . OSA on CPAP 03/30/2012  . GERD (gastroesophageal reflux disease) 04/12/2011  . Abdominal pain, epigastric 05/04/2010  . Unspecified eustachian tube disorder 02/23/2010  . OTITIS MEDIA, LEFT 02/23/2010  . INTERMITTENT VERTIGO 02/23/2010  . NAUSEA 01/18/2010  . LUQ PAIN 01/18/2010  .  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 CHEST PAIN 10/05/2009  . DEPRESSION 09/07/2009  . Obesity 07/17/2009  . Diabetes type 2, uncontrolled (HCC) 04/14/2009  . 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 ELEVATED BP 10/02/2008  . B12 deficiency 08/30/2007  . Vitamin D deficiency 08/30/2007  . HAIR LOSS 08/30/2007  . PARESTHESIA 08/30/2007  . WEIGHT GAIN 08/30/2007  . ANEMIA-IRON DEFICIENCY 05/28/2007    Past Surgical History:  Procedure Laterality Date  . CESAREAN SECTION  2004   x 1  . COLONOSCOPY    . DILITATION & CURRETTAGE/HYSTROSCOPY WITH NOVASURE ABLATION N/A 01/09/2015   Procedure: DILATATION & CURETTAGE/HYSTEROSCOPY WITH NOVASURE ABLATION;  Surgeon: Donovan KailAllen Ross, MD;  Location: [facility];  Service: Gynecology;  Laterality: N/A;  . fibroidectomy  2002   laparotomy for removal of fibroids  . MYOMECTOMY  2002  . SHOULDER ARTHROSCOPY Left 04/30/2014   Procedure: Left Shoulder Arthroscopy and Debridement;  Surgeon: Nadara MustardMarcus Duda V, MD;  [facility];  Service: Orthopedics;  Laterality: Left;  . TONSILLECTOMY AND ADENOIDECTOMY    . UPPER GI ENDOSCOPY    . WISDOM TOOTH EXTRACTION      OB History    No data available       Home Medications    Prior to Admission medications   Medication Sig Start Date End Date Taking? Authorizing Provider  albuterol (VENTOLIN HFA) 108 (90 Base) MCG/ACT inhaler Inhale 2 puffs into the  lungs every 6 (six) hours as needed for wheezing or shortness of breath. 02/22/16   Corwin Levins, MD  cholecalciferol (VITAMIN D) 1000 UNITS tablet Take 1 tablet (1,000 Units total) by mouth daily. 05/12/15 06-07-2016  Georgina Quint Plotnikov, MD  diphenhydrAMINE (BENADRYL) 25 MG tablet Take 1 tablet (25 mg total) by mouth every 6 (six) hours as needed for itching or allergies (hives). Patient not taking: Reported on 05/06/2016 03/31/14   Tresa Garter, MD  esomeprazole (NEXIUM) 40 MG capsule Take 1 capsule (40 mg total) by mouth daily. Patient not taking: Reported on 05/06/2016 03/07/16    Betty G [place], MD  fluconazole (DIFLUCAN) 150 MG tablet Take 1 tablet (150 mg total) by mouth once. Patient not taking: Reported on 05/06/2016 02/17/16   Reather Littler, MD  glucose blood (ONETOUCH VERIO) test strip Use as instructed to check blood sugar twice a day dx code E11.65 06/05/15   Reather Littler, MD  JARDIANCE 25 MG TABS tablet Take 1 tablet by mouth  daily 02/15/16   Reather Littler, MD  levofloxacin (LEVAQUIN) 500 MG tablet Take 1 tablet (500 mg total) by mouth daily. 05/06/16 05/16/16  Corwin Levins, MD  loratadine (CLARITIN) 10 MG tablet Take 10 mg by mouth daily.    Historical Provider, MD  meclizine (ANTIVERT) 12.5 MG tablet Take 1 tablet (12.5 mg total) by mouth 3 (three) times daily as needed for dizziness. 05/06/16 05/06/17  Corwin Levins, MD  metFORMIN (GLUCOPHAGE-XR) 500 MG 24 hr tablet Take 4 tablets by mouth  daily 01/12/16   Reather Littler, MD  montelukast (SINGULAIR) 10 MG tablet Take 1 tablet by mouth at  bedtime 02/15/16   Cristal Ford, MD  norethindrone-ethinyl estradiol-iron (ESTROSTEP FE,TILIA FE,TRI-LEGEST FE) 1-20/1-30/1-35 MG-MCG tablet Take 1 tablet by mouth daily.    Historical Provider, MD  olmesartan (BENICAR) 40 MG tablet Take 1 tablet (40 mg total) by mouth daily. 02/20/16   Tresa Garter, MD  [facility] DELICA LANCETS FINE MISC Use 2 per day dx code E11.65 06/05/15   Reather Littler, MD  pantoprazole (PROTONIX) 40 MG tablet Take 40 mg by mouth daily.  04/19/16   Historical Provider, MD  ranitidine (ZANTAC) 300 MG tablet Take 1 tablet by mouth at  bedtime 06/10/15   Tresa Garter, MD  simvastatin (ZOCOR) 20 MG tablet Take 1 tablet by mouth at  bedtime 01/12/16   Reather Littler, MD  TANZEUM 50 MG PEN Inject the contents of one  pen once per week 02/19/16   Reather Littler, MD  triamcinolone cream (KENALOG) 0.5 % Apply 1 application topically 3 (three) times daily. On rash Patient not taking: Reported on 05/06/2016 03/31/14   Tresa Garter, MD    Family History Family History    Problem Relation Age of Onset  . Hypertension Mother     father, sisters  . Diabetes Mother     sister  . Kidney cancer Mother   . Cancer Mother   . Cholelithiasis Sister   . Allergic rhinitis Sister   . Clotting disorder Daughter   . Urticaria Daughter   . Heart disease Maternal Uncle   . Heart disease Maternal Grandfather     Social History Social History  Substance Use Topics  . Smoking status: Never Smoker  . Smokeless tobacco: Never Used  . Alcohol use No     Comment: occasional     Allergies   Codeine sulfate; Hydrochlorothiazide w-triamterene; Oxycodone-acetaminophen; Prednisone; Spironolactone; and Victoza [liraglutide]  Review of Systems Review of Systems  Constitutional: Positive for fatigue.  Respiratory: Positive for shortness of breath. Negative for cough.   Cardiovascular: Negative for chest pain and leg swelling.  Gastrointestinal: Negative for abdominal pain, diarrhea and vomiting.  Genitourinary: Positive for frequency. Negative for dysuria.  Musculoskeletal: Negative for back pain.  Neurological: Positive for dizziness, weakness and light-headedness.  All other systems reviewed and are negative.    Physical Exam Updated Vital Signs BP (!) 79/37   Pulse 96   Temp 97.5 F (36.4 C) (Oral)   Resp (!) 28   Ht 5\' 6"  (1.676 m)   Wt 270 lb (122.5 kg)   LMP 04/20/2016 (Approximate)   SpO2 100%   BMI 43.58 kg/m   Physical Exam  Constitutional: She is oriented to person, place, and time. She appears well-developed and well-nourished.  obese  HENT:  Head: Normocephalic and atraumatic.  Right Ear: External ear normal.  Left Ear: External ear normal.  Nose: Nose normal.  Eyes: Right eye exhibits no discharge. Left eye exhibits no discharge.  Cardiovascular: Normal rate, regular rhythm and normal heart sounds.   Pulmonary/Chest: Effort normal and breath sounds normal. Tachypnea noted.  Abdominal: Soft. There is no tenderness. There is no CVA  tenderness.  Neurological: She is alert and oriented to person, place, and time.  Skin: Skin is warm and dry.  Nursing note and vitals reviewed.    ED Treatments / Results  Labs (all labs ordered are listed, but only abnormal results are displayed) Labs Reviewed  DIFFERENTIAL - Abnormal; Notable for the following:       Result Value   Neutro Abs 10.3 (*)    Basophils Absolute 0.2 (*)    All other components within normal limits  COMPREHENSIVE METABOLIC PANEL - Abnormal; Notable for the following:    Sodium 134 (*)    Chloride 96 (*)    CO2 8 (*)    Glucose, Bld 47 (*)    BUN 36 (*)    Creatinine, Ser 4.73 (*)    Calcium 8.6 (*)    Albumin 3.3 (*)    AST >10,000 (*)    ALT 10,671 (*)    Alkaline Phosphatase 271 (*)    Total Bilirubin 4.8 (*)    GFR calc non Af Amer 10 (*)    GFR calc Af Amer 12 (*)    Anion gap 30 (*)    All other components within normal limits  CBC WITH DIFFERENTIAL/PLATELET - Abnormal; Notable for the following:    Hemoglobin 8.8 (*)    HCT 30.1 (*)    MCV 70.8 (*)    MCH 20.7 (*)    MCHC 29.2 (*)    RDW 20.0 (*)    All other components within normal limits  TROPONIN I - Abnormal; Notable for the following:    Troponin I 0.14 (*)    All other components within normal limits  BRAIN NATRIURETIC PEPTIDE - Abnormal; Notable for the following:    B Natriuretic Peptide 103.7 (*)    All other components within normal limits  CK - Abnormal; Notable for the following:    Total CK 237 (*)    All other components within normal limits  OSMOLALITY - Abnormal; Notable for the following:    Osmolality 302 (*)    All other components within normal limits  I-STAT CG4 LACTIC ACID, ED - Abnormal; Notable for the following:    Lactic Acid, Venous 16.08 (*)    All  other components within normal limits  I-STAT CG4 LACTIC ACID, ED - Abnormal; Notable for the following:    Lactic Acid, Venous >17.00 (*)    All other components within normal limits  I-STAT CHEM 8,  ED - Abnormal; Notable for the following:    Sodium 128 (*)    Chloride 99 (*)    BUN 47 (*)    Creatinine, Ser 5.40 (*)    Glucose, Bld 142 (*)    Calcium, Ion 0.83 (*)    All other components within normal limits  I-STAT CG4 LACTIC ACID, ED - Abnormal; Notable for the following:    Lactic Acid, Venous 16.05 (*)    All other components within normal limits  CBG MONITORING, ED - Abnormal; Notable for the following:    Glucose-Capillary 45 (*)    All other components within normal limits  I-STAT VENOUS BLOOD GAS, ED - Abnormal; Notable for the following:    pH, Ven 7.040 (*)    pCO2, Ven 21.8 (*)    pO2, Ven 77.0 (*)    Bicarbonate 5.9 (*)    Acid-base deficit 23.0 (*)    All other components within normal limits  CBG MONITORING, ED - Abnormal; Notable for the following:    Glucose-Capillary 129 (*)    All other components within normal limits  CULTURE, BLOOD (ROUTINE X 2)  CULTURE, BLOOD (ROUTINE X 2)  URINE CULTURE  CBC  URINALYSIS, ROUTINE W REFLEX MICROSCOPIC (NOT AT [facility])  ACETAMINOPHEN LEVEL  SALICYLATE LEVEL  PROTIME-INR  VOLATILES,BLD-ACETONE,ETHANOL,ISOPROP,METHANOL  URINE RAPID DRUG SCREEN, HOSP PERFORMED  DRUGS OF ABUSE SCREEN W ALC, ROUTINE URINE  I-STAT BETA HCG BLOOD, ED ([facility], [facility], [facility] ONLY)  CBG MONITORING, ED  I-STAT CG4 LACTIC ACID, ED  I-STAT CG4 LACTIC ACID, ED    EKG  EKG Interpretation  Date/Time:  Sunday May 08 2016 11:07:13 EDT Ventricular Rate:  104 PR Interval:  134 QRS Duration: 126 QT Interval:  382 QTC Calculation: 502 R Axis:   60 Text Interpretation:  Sinus tachycardia Right atrial enlargement Right bundle branch block Abnormal ECG changes noted compared to 2 days ago Confirmed by Kallie Depolo MD, Kristyl Athens [phone]) on 05/08/2016 12:20:40 PM       Radiology Dg Chest Portable 1 View  Result Date: 05/08/2016 CLINICAL DATA:  Shortness of breath and weakness.  Dizziness. EXAM: PORTABLE CHEST 1 VIEW COMPARISON:  May 06, 2016 FINDINGS: There  is no edema or consolidation. Heart is upper normal in size with pulmonary vascularity within normal limits. No adenopathy. No pneumothorax. No bone lesions. IMPRESSION: No edema or consolidation.  Stable cardiac silhouette. Electronically Signed   By: Bretta Bang III M.D.   On: 05/08/2016 12:41    Procedures .Central Line Date/Time: 05/08/2016 2:06 PM Performed by: Pricilla Loveless Authorized by: Pricilla Loveless   Consent:    Consent obtained:  Verbal and written   Consent given by:  Patient   Risks discussed:  Arterial puncture, bleeding, incorrect placement, infection, nerve damage and pneumothorax   Alternatives discussed:  No treatment Pre-procedure details:    Hand hygiene: Hand hygiene performed prior to insertion     Sterile barrier technique: All elements of maximal sterile technique followed     Skin preparation:  ChloraPrep   Skin preparation agent: Skin preparation agent completely dried prior to procedure   Anesthesia (see MAR for exact dosages):    Anesthesia method:  Local infiltration   Local anesthetic:  Lidocaine 1% w/o epi Procedure details:  Location:  R femoral   Site selection rationale:  Patient could not lay flat without severe dyspnea   Procedural supplies:  Triple lumen   Landmarks identified: yes     Ultrasound guidance: yes     Sterile ultrasound techniques: Sterile gel and sterile probe covers were used     Number of attempts:  2   Successful placement: yes   Post-procedure details:    Post-procedure:  Dressing applied and line sutured   Assessment:  Blood return through all ports and free fluid flow   Patient tolerance of procedure:  Tolerated well, no immediate complications   (including critical care time)  CRITICAL CARE Performed by: Pricilla Loveless T   Total critical care time: 60 minutes  Critical care time was exclusive of separately billable procedures and treating other patients.  Critical care was necessary to treat or prevent  imminent or life-threatening deterioration.  Critical care was time spent personally by me on the following activities: development of treatment plan with patient and/or surrogate as well as nursing, discussions with consultants, evaluation of patient's response to treatment, examination of patient, obtaining history from patient or surrogate, ordering and performing treatments and interventions, ordering and review of laboratory studies, ordering and review of radiographic studies, pulse oximetry and re-evaluation of patient's condition.  Medications Ordered in ED Medications  sodium chloride 0.9 % bolus 1,000 mL (1,000 mLs Intravenous New Bag/Given 05/08/16 1253)    And  sodium chloride 0.9 % bolus 1,000 mL (1,000 mLs Intravenous New Bag/Given 05/08/16 1254)    And  sodium chloride 0.9 % bolus 1,000 mL (not administered)    And  sodium chloride 0.9 % bolus 1,000 mL (not administered)  piperacillin-tazobactam (ZOSYN) IVPB 3.375 g (3.375 g Intravenous New Bag/Given 05/08/16 1256)  vancomycin (VANCOCIN) 2,000 mg in sodium chloride 0.9 % 500 mL IVPB (2,000 mg Intravenous New Bag/Given 05/08/16 1258)  dextrose 50 % solution 50 mL (50 mLs Intravenous Given 05/08/16 1219)  sodium chloride 0.9 % bolus 1,000 mL (1,000 mLs Intravenous New Bag/Given 05/08/16 1221)     Initial Impression / Assessment and Plan / ED Course  I have reviewed the triage vital signs and the nursing notes.  Pertinent labs & imaging results that were available during my care of the patient were reviewed by me and considered in my medical decision making (see chart for details).   Patient presents in shock. It appears that she now has renal failure and liver failure that were not present on her lab work 2 days ago. Review of the labs that she had obtained by her PCP 2 days ago does show some mild liver abnormalities but nowhere near what is present today. Unclear cause. Asked about other possible ingestions or medicines such as  Tylenol, alcohol, or mixing of her other meds. She adamantly denies. She states she has taken 2 ibuprofen in the last couple days but otherwise no new meds. Metformin concerned because lactic acidosis like this but her other findings are concerning. She was treated as possible sepsis although no obvious source at this time. She has not made any urine currently. Despite early fluid resuscitation she is still quite hypotensive and so a femoral central line was placed as above. She did not tolerate laying back very far and so an IJ or subclavian cannot be placed. ICU has been consulted and will admit to the ICU. I discussed condition, findings, and the nature of her critical illness with patient and family.  Final Clinical Impressions(s) /  ED Diagnoses   Final diagnoses:  Shock (HCC)  Acute kidney injury (HCC)  Acute liver failure without hepatic coma  Lactic acidosis  Metabolic acidosis    New Prescriptions New Prescriptions   No medications on file     Pricilla Loveless, MD 05/08/16 1534

## 2016-05-08 NOTE — Progress Notes (Signed)
eLink Physician-Brief Progress Note Patient Name: Julia Estrada DOB: 04/04/1971 MRN: [id number]   Date of Service  05/08/2016  HPI/Events of Note  N/V  eICU Interventions  Will order: 1. Zofran 4 mg IV Q 6 hours PRN N/V.     Intervention Category Intermediate Interventions: Other:  Lenell AntuSommer,Dylen Mcelhannon Eugene 05/08/2016, 10:29 PM

## 2016-05-08 NOTE — ED Notes (Signed)
GI MD at bedside

## 2016-05-08 NOTE — ED Notes (Signed)
Delay in lab draw,  Ultra sound in room.

## 2016-05-08 NOTE — Progress Notes (Signed)
CRITICAL VALUE ALERT  Critical value received:  Lactic acid-12.4  Date of notification:  05/08/2016  Time of notification:  1945  Critical value read back:Yes.    Nurse who received alert:  Terrilyn SaverHopper, Erhard Senske Anderson  MD notified (1st page):  Dr. Arsenio LoaderSommer  Time of first page:  1945  MD notified (2nd page):  Time of second page:  Responding MD:  Dr. Arsenio LoaderSommer  Time MD responded:  [phone]

## 2016-05-08 NOTE — Progress Notes (Signed)
CRITICAL VALUE ALERT  Critical value received:  PH= 7.135, pCo2= 17, pO2= 121, HCO3= 5.8  Date of notification:  05/08/16  Time of notification:  1744  Critical value read back:Yes.    Nurse who received alert:  Tory EmeraldMichelle Geraldine Tesar, RN  MD notified (1st page):  Pola CornELINK MD  Time of first page:  1744  MD notified (2nd page):  Time of second page:  Responding MD:  Pola CornELINK MD  Time MD responded:  [id number]

## 2016-05-08 NOTE — Progress Notes (Signed)
CRITICAL VALUE ALERT  Critical value received:  INR 6.92  Date of notification:  05/08/16  Time of notification:  1740  Critical value read back:Yes.    Nurse who received alert:  Tory EmeraldMichelle Rosie Golson, RN  MD notified (1st page):  Pola CornELINK MD  Time of first page:  1740  MD notified (2nd page):  Time of second page:  Responding MD:  Pola CornELINK DM  Time MD responded:  1740

## 2016-05-08 NOTE — Progress Notes (Signed)
Pharmacy Antibiotic Note  45 YOF presented on 05/06/16 and then 05/08/16 with complaints of increased dizziness and weakness.  Pharmacy consulted to initiate vancomycin and Zosyn for sepsis.  First doses of antibiotics are already ordered.  Patient has AKI (SCr 0.83 >> 5.4).  Patient is afebrile and her WBC is WNL.   Plan: - Vanc 2gm IV x 1, 1750mg  IV Q48H.  May need to dose per level. - Zosyn 3.375gm IV x 1, then 2.25gm IV Q8H - Monitor renal fxn, clinical progress, vanc trough as indicated  Height: 5\' 6"  (167.6 cm) Weight: 270 lb (122.5 kg) IBW/kg (Calculated) : 59.3  Temp (24hrs), Avg:97 F (36.1 C), Min:96.4 F (35.8 C), Max:97.5 F (36.4 C)   Recent Labs Lab 05/06/16 1219 05/06/16 1509 05/08/16 1147 05/08/16 1300 05/08/16 1301  WBC 7.3 8.9  --   --   --   CREATININE 0.82 0.83  --   --  5.40*  LATICACIDVEN  --   --  16.08* >17.00*  --     Estimated Creatinine Clearance: 17.6 mL/min (by C-G formula based on SCr of 5.4 mg/dL (H)).    Allergies  Allergen Reactions  . Codeine Sulfate Nausea And Vomiting  . Hydrochlorothiazide W-Triamterene     REACTION: cramping  . Oxycodone-Acetaminophen Nausea And Vomiting  . Prednisone Hives  . Spironolactone     Cramps   . Victoza [Liraglutide]     n/v    Antimicrobials this admission:  Vanc 10/8 >> Zosyn 10/8 >>  Dose adjustments this admission:  N/A  Microbiology results:  10/8 UCx -  10/8 BCx -   Joselin Crandell D. Laney Potashang, PharmD, BCPS Pager:  [phone] 05/08/2016, 1:18 PM

## 2016-05-08 NOTE — Progress Notes (Signed)
eLink Physician-Brief Progress Note Patient Name: Julia Estrada DOB: 01/06/1971 MRN: [id number]   Date of Service  05/08/2016  HPI/Events of Note  Multiple issues:  1. INR = 6.9 and ABG = 7.135/17.2/121/5.8.  eICU Interventions  Will order:  1. Vitamin K+ 10 mg IV now.  2. FFP 4 units IV now. 3. Repeat PT/INR in AM. 4. NaHCO3 100 meq IV now.  5. NaHCO3 IV infusion to run at 100 mL/hour.  6. ABG at 9 PM.     Intervention Category Major Interventions: Acid-Base disturbance - evaluation and management Intermediate Interventions: Coagulopathy - evaluation and management  Sommer,Steven Eugene 05/08/2016, 5:45 PM

## 2016-05-08 NOTE — ED Triage Notes (Addendum)
Patient complains of increased dizziness and weakness since Friday. Seen in ED for same and no diagnosis. Patient appears weak on assessment. Alert and oriented.

## 2016-05-08 NOTE — Progress Notes (Signed)
eLink Physician-Brief Progress Note Patient Name: Julia Estrada DOB: 02/28/1971 MRN: [id number]   Date of Service  05/08/2016  HPI/Events of Note  Multiple issues: 1. K+ = 5.8. Bedside monitor has what appear to be peaked T waves and 2. Needs orders for home CPAP.  eICU Interventions  Will order: 1. D50 1 amp IV now.  2. Novolog 10 units IV now. 3. Calcium gluconate 2 gm IV now.  4. Kayexalate 30 gm PR now.  5. BMP at 12 midnight.  6. CPAP - auto titrating device or per Respiratory Therapy.      Intervention Category Major Interventions: Electrolyte abnormality - evaluation and management  Hydee Fleece Eugene 05/08/2016, 7:44 PM

## 2016-05-08 NOTE — ED Notes (Signed)
Critical care aware of pts respiratory rate.

## 2016-05-08 NOTE — Progress Notes (Signed)
RT Note: Pt vomiting at this time. CPAP at bedside. RT will continue to monitor

## 2016-05-08 NOTE — ED Notes (Signed)
Dr. Criss AlvineGoldston at bedside to evaluate rt femoral line bleeding at insertion site. Pt does not report any pain to site. Critical care MD paged and states to continue using line and will evaluate upon arrival to ICU.

## 2016-05-09 ENCOUNTER — Inpatient Hospital Stay (HOSPITAL_COMMUNITY): Payer: 59

## 2016-05-09 DIAGNOSIS — K7290 Hepatic failure, unspecified without coma: Secondary | ICD-10-CM

## 2016-05-09 DIAGNOSIS — K7201 Acute and subacute hepatic failure with coma: Secondary | ICD-10-CM

## 2016-05-09 DIAGNOSIS — R6520 Severe sepsis without septic shock: Secondary | ICD-10-CM

## 2016-05-09 DIAGNOSIS — E162 Hypoglycemia, unspecified: Secondary | ICD-10-CM

## 2016-05-09 DIAGNOSIS — J9601 Acute respiratory failure with hypoxia: Secondary | ICD-10-CM

## 2016-05-09 DIAGNOSIS — A419 Sepsis, unspecified organism: Secondary | ICD-10-CM

## 2016-05-09 LAB — BASIC METABOLIC PANEL
ANION GAP: 22 — AB (ref 5–15)
Anion gap: 21 — ABNORMAL HIGH (ref 5–15)
Anion gap: 22 — ABNORMAL HIGH (ref 5–15)
BUN: 30 mg/dL — ABNORMAL HIGH (ref 6–20)
BUN: 31 mg/dL — AB (ref 6–20)
BUN: 34 mg/dL — AB (ref 6–20)
CALCIUM: 6.3 mg/dL — AB (ref 8.9–10.3)
CALCIUM: 6 mg/dL — AB (ref 8.9–10.3)
CHLORIDE: 99 mmol/L — AB (ref 101–111)
CO2: 15 mmol/L — ABNORMAL LOW (ref 22–32)
CO2: 18 mmol/L — AB (ref 22–32)
CO2: 20 mmol/L — ABNORMAL LOW (ref 22–32)
CREATININE: 4.9 mg/dL — AB (ref 0.44–1.00)
CREATININE: 5.7 mg/dL — AB (ref 0.44–1.00)
Calcium: 7.1 mg/dL — ABNORMAL LOW (ref 8.9–10.3)
Chloride: 94 mmol/L — ABNORMAL LOW (ref 101–111)
Chloride: 98 mmol/L — ABNORMAL LOW (ref 101–111)
Creatinine, Ser: 5.68 mg/dL — ABNORMAL HIGH (ref 0.44–1.00)
GFR calc Af Amer: 11 mL/min — ABNORMAL LOW (ref >60)
GFR calc Af Amer: 9 mL/min — ABNORMAL LOW (ref >60)
GFR calc non Af Amer: 10 mL/min — ABNORMAL LOW (ref >60)
GFR, EST AFRICAN AMERICAN: 10 mL/min — AB (ref >60)
GFR, EST NON AFRICAN AMERICAN: 8 mL/min — AB (ref >60)
GFR, EST NON AFRICAN AMERICAN: 8 mL/min — AB (ref >60)
GLUCOSE: 81 mg/dL (ref 65–99)
Glucose, Bld: 146 mg/dL — ABNORMAL HIGH (ref 65–99)
Glucose, Bld: 94 mg/dL (ref 65–99)
POTASSIUM: 4.3 mmol/L (ref 3.5–5.1)
Potassium: 3.9 mmol/L (ref 3.5–5.1)
Potassium: 3.6 mmol/L (ref 3.5–5.1)
SODIUM: 136 mmol/L (ref 135–145)
SODIUM: 138 mmol/L (ref 135–145)
Sodium: 135 mmol/L (ref 135–145)

## 2016-05-09 LAB — PREPARE FRESH FROZEN PLASMA
UNIT DIVISION: 0
UNIT DIVISION: 0
UNIT DIVISION: 0
Unit division: 0

## 2016-05-09 LAB — COMPREHENSIVE METABOLIC PANEL
ALBUMIN: 2.8 g/dL — AB (ref 3.5–5.0)
ALT: 7110 U/L — AB (ref 14–54)
Alkaline Phosphatase: 222 U/L — ABNORMAL HIGH (ref 38–126)
Anion gap: 23 — ABNORMAL HIGH (ref 5–15)
BUN: 31 mg/dL — AB (ref 6–20)
CHLORIDE: 98 mmol/L — AB (ref 101–111)
CO2: 16 mmol/L — AB (ref 22–32)
CREATININE: 5.03 mg/dL — AB (ref 0.44–1.00)
Calcium: 7 mg/dL — ABNORMAL LOW (ref 8.9–10.3)
GFR calc Af Amer: 11 mL/min — ABNORMAL LOW (ref >60)
GFR, EST NON AFRICAN AMERICAN: 10 mL/min — AB (ref >60)
GLUCOSE: 38 mg/dL — AB (ref 65–99)
Potassium: 4.2 mmol/L (ref 3.5–5.1)
SODIUM: 137 mmol/L (ref 135–145)
Total Bilirubin: 4.8 mg/dL — ABNORMAL HIGH (ref 0.3–1.2)
Total Protein: 5.8 g/dL — ABNORMAL LOW (ref 6.5–8.1)

## 2016-05-09 LAB — GLUCOSE, CAPILLARY
GLUCOSE-CAPILLARY: 104 mg/dL — AB (ref 65–99)
GLUCOSE-CAPILLARY: 105 mg/dL — AB (ref 65–99)
Glucose-Capillary: 104 mg/dL — ABNORMAL HIGH (ref 65–99)
Glucose-Capillary: 106 mg/dL — ABNORMAL HIGH (ref 65–99)
Glucose-Capillary: 114 mg/dL — ABNORMAL HIGH (ref 65–99)
Glucose-Capillary: 126 mg/dL — ABNORMAL HIGH (ref 65–99)
Glucose-Capillary: 31 mg/dL — CL (ref 65–99)
Glucose-Capillary: 71 mg/dL (ref 65–99)
Glucose-Capillary: 77 mg/dL (ref 65–99)
Glucose-Capillary: 86 mg/dL (ref 65–99)
Glucose-Capillary: 95 mg/dL (ref 65–99)

## 2016-05-09 LAB — CBC
HCT: 29 % — ABNORMAL LOW (ref 36.0–46.0)
HCT: 27.5 % — ABNORMAL LOW (ref 36.0–46.0)
Hemoglobin: 8.9 g/dL — ABNORMAL LOW (ref 12.0–15.0)
Hemoglobin: 8.5 g/dL — ABNORMAL LOW (ref 12.0–15.0)
MCH: 20.7 pg — ABNORMAL LOW (ref 26.0–34.0)
MCH: 20.6 pg — ABNORMAL LOW (ref 26.0–34.0)
MCHC: 30.7 g/dL (ref 30.0–36.0)
MCHC: 30.9 g/dL (ref 30.0–36.0)
MCV: 67.4 fL — AB (ref 78.0–100.0)
MCV: 66.7 fL — AB (ref 78.0–100.0)
PLATELETS: 41 10*3/uL — AB (ref 150–400)
Platelets: 79 10*3/uL — ABNORMAL LOW (ref 150–400)
RBC: 4.3 MIL/uL (ref 3.87–5.11)
RBC: 4.12 MIL/uL (ref 3.87–5.11)
RDW: 19.5 % — ABNORMAL HIGH (ref 11.5–15.5)
RDW: 19.3 % — ABNORMAL HIGH (ref 11.5–15.5)
WBC: 15.5 10*3/uL — AB (ref 4.0–10.5)
WBC: 18.2 10*3/uL — AB (ref 4.0–10.5)

## 2016-05-09 LAB — PROTIME-INR
INR: 5.61 — AB
INR: 9.6 — AB
INR: 4.64
PROTHROMBIN TIME: 80.6 s — AB (ref 11.4–15.2)
Prothrombin Time: 52.5 seconds — ABNORMAL HIGH (ref 11.4–15.2)
Prothrombin Time: 45.1 seconds — ABNORMAL HIGH (ref 11.4–15.2)

## 2016-05-09 LAB — HEPATIC FUNCTION PANEL
ALBUMIN: 2.5 g/dL — AB (ref 3.5–5.0)
ALT: 6160 U/L — ABNORMAL HIGH (ref 14–54)
AST: 9884 U/L — ABNORMAL HIGH (ref 15–41)
Alkaline Phosphatase: 223 U/L — ABNORMAL HIGH (ref 38–126)
BILIRUBIN TOTAL: 5.2 mg/dL — AB (ref 0.3–1.2)
Bilirubin, Direct: 2.4 mg/dL — ABNORMAL HIGH (ref 0.1–0.5)
Indirect Bilirubin: 2.8 mg/dL — ABNORMAL HIGH (ref 0.3–0.9)
TOTAL PROTEIN: 5.1 g/dL — AB (ref 6.5–8.1)

## 2016-05-09 LAB — BLOOD GAS, ARTERIAL
ACID-BASE DEFICIT: 7.6 mmol/L — AB (ref 0.0–2.0)
BICARBONATE: 16.5 mmol/L — AB (ref 20.0–28.0)
DRAWN BY: 301361
FIO2: 28
O2 SAT: 92.1 %
PATIENT TEMPERATURE: 98.6
pCO2 arterial: 28.2 mmHg — ABNORMAL LOW (ref 32.0–48.0)
pH, Arterial: 7.384 (ref 7.350–7.450)
pO2, Arterial: 78.7 mmHg — ABNORMAL LOW (ref 83.0–108.0)

## 2016-05-09 LAB — TROPONIN I
TROPONIN I: 0.6 ng/mL — AB (ref <0.03)
TROPONIN I: 0.7 ng/mL — AB (ref <0.03)
TROPONIN I: 0.56 ng/mL — AB (ref <0.03)

## 2016-05-09 LAB — RESPIRATORY PANEL BY PCR
Adenovirus: NOT DETECTED
BORDETELLA PERTUSSIS-RVPCR: NOT DETECTED
CHLAMYDOPHILA PNEUMONIAE-RVPPCR: NOT DETECTED
Coronavirus 229E: NOT DETECTED
Coronavirus HKU1: NOT DETECTED
Coronavirus NL63: NOT DETECTED
Coronavirus OC43: NOT DETECTED
INFLUENZA A-RVPPCR: NOT DETECTED
Influenza B: NOT DETECTED
METAPNEUMOVIRUS-RVPPCR: NOT DETECTED
Mycoplasma pneumoniae: NOT DETECTED
PARAINFLUENZA VIRUS 2-RVPPCR: NOT DETECTED
PARAINFLUENZA VIRUS 3-RVPPCR: NOT DETECTED
PARAINFLUENZA VIRUS 4-RVPPCR: NOT DETECTED
Parainfluenza Virus 1: NOT DETECTED
RHINOVIRUS / ENTEROVIRUS - RVPPCR: NOT DETECTED
Respiratory Syncytial Virus: NOT DETECTED

## 2016-05-09 LAB — APTT: aPTT: 47 seconds — ABNORMAL HIGH (ref 24–36)

## 2016-05-09 LAB — POCT I-STAT 3, ART BLOOD GAS (G3+)
Acid-base deficit: 2 mmol/L (ref 0.0–2.0)
Bicarbonate: 23 mmol/L (ref 20.0–28.0)
O2 SAT: 100 %
PCO2 ART: 36.7 mmHg (ref 32.0–48.0)
PH ART: 7.402 (ref 7.350–7.450)
Patient temperature: 97.6
TCO2: 24 mmol/L (ref 0–100)
pO2, Arterial: 176 mmHg — ABNORMAL HIGH (ref 83.0–108.0)

## 2016-05-09 LAB — AMMONIA
AMMONIA: 131 umol/L — AB (ref 9–35)
Ammonia: 135 umol/L — ABNORMAL HIGH (ref 9–35)

## 2016-05-09 LAB — LACTIC ACID, PLASMA
LACTIC ACID, VENOUS: 10.1 mmol/L — AB (ref 0.5–1.9)
LACTIC ACID, VENOUS: 9.6 mmol/L — AB (ref 0.5–1.9)
Lactic Acid, Venous: 8.5 mmol/L (ref 0.5–1.9)

## 2016-05-09 LAB — PROCALCITONIN: Procalcitonin: 2.17 ng/mL

## 2016-05-09 LAB — URINE CULTURE: Culture: >=100000 — AB

## 2016-05-09 LAB — STREP PNEUMONIAE URINARY ANTIGEN: Strep Pneumo Urinary Antigen: NEGATIVE

## 2016-05-09 LAB — FIBRINOGEN
FIBRINOGEN: 248 mg/dL (ref 210–475)
FIBRINOGEN: 228 mg/dL (ref 210–475)

## 2016-05-09 LAB — ABO/RH: ABO/RH(D): O POS

## 2016-05-09 LAB — LACTATE DEHYDROGENASE

## 2016-05-09 MED ORDER — MIDAZOLAM HCL 2 MG/2ML IJ SOLN
INTRAMUSCULAR | Status: AC
  Filled 2016-05-09: qty 2

## 2016-05-09 MED ORDER — FENTANYL CITRATE (PF) 100 MCG/2ML IJ SOLN: 50.0000 ug | Freq: Once | INTRAMUSCULAR | Status: DC

## 2016-05-09 MED ORDER — SODIUM BICARBONATE 8.4 % IV SOLN
INTRAVENOUS | Status: DC
  Administered 2016-05-09 – 2016-05-10 (×4): via INTRAVENOUS
  Filled 2016-05-09 (×8): qty 150

## 2016-05-09 MED ORDER — DEXTROSE 50 % IV SOLN
INTRAVENOUS | Status: AC
  Filled 2016-05-09: qty 50

## 2016-05-09 MED ORDER — LACTULOSE 10 GM/15ML PO SOLN
30.0000 g | Freq: Three times a day (TID) | ORAL | Status: DC
  Administered 2016-05-09 (×2): 30 g via ORAL
  Filled 2016-05-09 (×2): qty 45

## 2016-05-09 MED ORDER — DEXTROSE 50 % IV SOLN
1.0000 | Freq: Once | INTRAVENOUS | Status: AC
  Administered 2016-05-09: 50 mL via INTRAVENOUS

## 2016-05-09 MED ORDER — FAMOTIDINE IN NACL 20-0.9 MG/50ML-% IV SOLN
20.0000 mg | INTRAVENOUS | Status: DC
  Filled 2016-05-09: qty 50

## 2016-05-09 MED ORDER — FENTANYL 2500MCG IN NS 250ML (10MCG/ML) PREMIX INFUSION
25.0000 ug/h | INTRAVENOUS | Status: DC
  Administered 2016-05-09: 100 ug/h via INTRAVENOUS
  Administered 2016-05-10: 300 ug/h via INTRAVENOUS
  Filled 2016-05-09 (×2): qty 250

## 2016-05-09 MED ORDER — FENTANYL BOLUS VIA INFUSION
50.0000 ug | INTRAVENOUS | Status: DC | PRN
  Administered 2016-05-10: 50 ug via INTRAVENOUS
  Filled 2016-05-09: qty 50

## 2016-05-09 MED ORDER — FENTANYL CITRATE (PF) 100 MCG/2ML IJ SOLN
INTRAMUSCULAR | Status: AC
  Filled 2016-05-09: qty 2

## 2016-05-09 MED ORDER — NOREPINEPHRINE BITARTRATE 1 MG/ML IV SOLN: 2.0000 ug/min | INTRAVENOUS | Status: AC

## 2016-05-09 MED ORDER — ETOMIDATE 2 MG/ML IV SOLN
20.0000 mg | Freq: Once | INTRAVENOUS | Status: AC
  Administered 2016-05-09: 20 mg via INTRAVENOUS

## 2016-05-09 MED ORDER — MIDAZOLAM HCL 2 MG/2ML IJ SOLN
2.0000 mg | Freq: Once | INTRAMUSCULAR | Status: AC
  Administered 2016-05-09: 2 mg via INTRAVENOUS

## 2016-05-09 MED ORDER — ORAL CARE MOUTH RINSE
15.0000 mL | Freq: Four times a day (QID) | OROMUCOSAL | Status: DC
  Administered 2016-05-10: 15 mL via OROMUCOSAL

## 2016-05-09 MED ORDER — SODIUM BICARBONATE 8.4 % IV SOLN: 150.0000 meq | INTRAVENOUS | 0 refills | Status: AC

## 2016-05-09 MED ORDER — SODIUM CHLORIDE 0.9 % IV SOLN: INTRAVENOUS | 0 refills | Status: AC

## 2016-05-09 MED ORDER — FENTANYL CITRATE (PF) 100 MCG/2ML IJ SOLN
100.0000 ug | Freq: Once | INTRAMUSCULAR | Status: AC
  Administered 2016-05-09: 100 ug via INTRAVENOUS

## 2016-05-09 MED ORDER — PIPERACILLIN-TAZOBACTAM IN DEX 2-0.25 GM/50ML IV SOLN
2.2500 g | Freq: Three times a day (TID) | INTRAVENOUS | Status: AC
Start: 2016-05-09 — End: ?

## 2016-05-09 MED ORDER — LACTULOSE 10 GM/15ML PO SOLN: 30.0000 g | Freq: Three times a day (TID) | ORAL | 0 refills | Status: AC

## 2016-05-09 MED ORDER — DEXTROSE 5 % IV SOLN
10.0000 mg | Freq: Once | INTRAVENOUS | Status: AC
  Administered 2016-05-09: 10 mg via INTRAVENOUS
  Filled 2016-05-09: qty 1

## 2016-05-09 MED ORDER — CHLORHEXIDINE GLUCONATE 0.12% ORAL RINSE (MEDLINE KIT)
15.0000 mL | Freq: Two times a day (BID) | OROMUCOSAL | Status: DC
  Administered 2016-05-09: 15 mL via OROMUCOSAL

## 2016-05-09 MED ORDER — DEXTROSE 50 % IV SOLN
25.0000 mL | Freq: Once | INTRAVENOUS | Status: AC
  Administered 2016-05-09: 25 mL via INTRAVENOUS

## 2016-05-09 MED ORDER — ROCURONIUM BROMIDE 50 MG/5ML IV SOLN
50.0000 mg | Freq: Once | INTRAVENOUS | Status: AC
  Administered 2016-05-09: 50 mg via INTRAVENOUS

## 2016-05-09 MED ORDER — SODIUM CHLORIDE 0.9 % IV SOLN: Freq: Once | INTRAVENOUS | Status: DC

## 2016-05-09 NOTE — Progress Notes (Signed)
CRITICAL VALUE ALERT  Critical value received:  Lactic acid 9.6  Date of notification:  05/09/16  Time of notification:  1330  Critical value read back:Yes.    Nurse who received alert:  Tory EmeraldMichelle Lucelia Lacey, RN  MD notified (1st page):  Zenia ResidesPeter Babcock, NP  Time of first page:  On floor @ [phone]  MD notified (2nd page):  Time of second page:  Responding MD:  Zenia ResidesPeter Babcock, NP  Time MD responded:  1330   Also made NP ware of repeat ammonia of 135, and Troponin 0.7

## 2016-05-09 NOTE — Procedures (Signed)
Intubation Procedure Note Shauna Hughanya B Reichl [id number] 06/18/1971  Procedure: Intubation Indications: Airway protection and maintenance  Procedure Details Consent: Unable to obtain consent because of emergent medical necessity. Time Out: Verified patient identification, verified procedure, site/side was marked, verified correct patient position, special equipment/implants available, medications/allergies/relevent history reviewed, required imaging and test results available.  Performed  Maximum sterile technique was used including gloves, hand hygiene and mask.  MAC    Evaluation Hemodynamic Status: BP stable throughout; O2 sats: stable throughout Patient's Current Condition: stable Complications: No apparent complications Patient did tolerate procedure well. Chest X-ray ordered to verify placement.  CXR: pending.   YACOUB,WESAM 05/09/2016

## 2016-05-09 NOTE — Progress Notes (Signed)
Hypoglycemic Event  CBG: 31  Treatment: D50 IV 50 mL  Symptoms: Nervous/irritable  Follow-up CBG: Time:126 CBG Result:0428  Possible Reasons for Event: Inadequate meal intake  Comments/MD notified:Dr. Normajean GlasgowAlva    Darlena Koval, Dimas AguasMindy Anderson

## 2016-05-09 NOTE — Progress Notes (Signed)
eLink Physician-Brief Progress Note Patient Name: Julia Estrada DOB: 12/31/1970 MRN: [id number]   Date of Service  05/09/2016  HPI/Events of Note  Intubated for progressive encephalopathy. Head CT pending. INR now > 9. Will give urgent FFP now. At high risk for cerebral edema. May need crani or bolt./ will follow  eICU Interventions       Intervention Category Intermediate Interventions: Coagulopathy - evaluation and management  Rashan Rounsaville S. 05/09/2016, 5:25 PM

## 2016-05-09 NOTE — Progress Notes (Addendum)
PULMONARY / CRITICAL CARE MEDICINE   Name: Julia Estrada MRN: [id number] DOB: 05/27/1971    ADMISSION DATE:  05/08/2016  REFERRING MD:  EDP Criss Alvine([place])   CHIEF COMPLAINT:  Lactic acidosis, AKI, hypotension   HISTORY OF PRESENT ILLNESS:   45yo female with hx DM on metformin, HTN, OSA presented 10/8 with 3 day hx vague c/o dizziness, fever, headache, urinary frequency, weakness which she [facility].  At that time she had normal renal function, normal blood sugar and was sent home with presumed viral illness.  She did have abnormal LFT's at that time.  She returned 10/8 with c/o same with the addition of dyspnea.  In ER she had metabolic disarray with Scr 5.4, anion gap 30, lactate >17, pH 7.040, profound hypotension with SBP 60-70, severely abnormal LFT's (AST and ALT >10,000, bili 4.8) and hypoglycemia with CBG 40. ER placed femoral CVL (pt unable to lie flat), started levophed and PCCM called to admit to ICU.    SUBJECTIVE: Patient hypoglycemic overnight. Slight confusion this morning. Patient reporting vague abdominal pain. Mild, intermittent nausea. Denies any chest pain or pressure.  REVIEW OF SYSTEMS: No cough. Does report some mild dyspnea. No fever or chills. Does have a mild sore throat.   VITAL SIGNS: BP 115/66   Pulse 97   Temp 97.3 F (36.3 C) (Oral)   Resp (!) 30   Ht 5\' 6"  (1.676 m)   Wt 283 lb 8.2 oz (128.6 kg)   LMP 04/20/2016 (Approximate)   SpO2 97%   BMI 45.76 kg/m   HEMODYNAMICS:    VENTILATOR SETTINGS:    INTAKE / OUTPUT: I/O last 3 completed shifts: In: 5429.2 [I.V.:1849.2; Blood:1075; Other:135; IV Piggyback:2370] Out: 83 [Urine:83]  PHYSICAL EXAMINATION: General:  Obese female. No distress. Awake. Family at bedside.  Neuro:  Oriented x4. No meningismus. Moving all 4 extremities equally.  HEENT:  Tacky mucus membranes. No scleral injection. Pupils symmetric. Cardiovascular:  Regular rate & rhythm. No edema. Lungs:  Mildly  decreased breath sounds in bases. Normal work of breathing on CPAP. Abdomen:  Obese. Soft. Mildly tender to palp without guarding.  Musculoskeletal:  Warm and dry. No rash on exposes skin.   LABS:  BMET  Recent Labs Lab 05/08/16 1819 05/09/16 0021 05/09/16 0400  NA 133* 135 137  K 5.8* 4.3 4.2  CL 101 99* 98*  CO2 7* 15* 16*  BUN 34* 34* 31*  CREATININE 4.68* 4.90* 5.03*  GLUCOSE 87 81 38*    Electrolytes  Recent Labs Lab 05/08/16 1819 05/09/16 0021 05/09/16 0400  CALCIUM 6.9* 7.1* 7.0*    CBC  Recent Labs Lab 05/08/16 1410 05/08/16 1620 05/09/16 0400  WBC 11.7* 11.2* 15.5*  HGB 8.8* 8.2* 8.9*  HCT 30.1* 28.1* 29.0*  PLT 58* 50* 41*    Coag's  Recent Labs Lab 05/08/16 1620 05/09/16 0400  INR 6.92* 5.61*    Sepsis Markers  Recent Labs Lab 05/08/16 1421 05/08/16 1626 05/08/16 1820  LATICACIDVEN 16.05* 13.49* 12.4*    ABG  Recent Labs Lab 05/08/16 1736 05/08/16 2118  PHART 7.135* 7.272*  PCO2ART 17.2* 22.0*  PO2ART 121.0* 103.0    Liver Enzymes  Recent Labs Lab 05/08/16 1131 05/08/16 1819 05/09/16 0400  AST >10,000* >10,000* >10,000*  ALT 10,671* 8,457* 7,110*  ALKPHOS 271* 229* 222*  BILITOT 4.8* 4.6* 4.8*  ALBUMIN 3.3* 2.5* 2.8*    Cardiac Enzymes  Recent Labs Lab 05/08/16 1410  TROPONINI 0.14*  Glucose  Recent Labs Lab 05/08/16 1303 05/08/16 1530 05/08/16 1958 05/09/16 0019 05/09/16 0358 05/09/16 0428  GLUCAP 129* 96 77 77 31* 126*    Imaging US Abdomen Port  Result Date: 05/08/2016 CLINICAL DATA:  Liver and renal failure. EXAM: ABDOMEN ULTRASOUND COMPLETE COMPARISON:  CT, 06/30/2014 FINDINGS: Gallbladder: Gallbladder is contracted. No gallstones. Wall appears edematous which is nonspecific in this contracted gallbladder. Common bile duct: Diameter: 3 mm Liver: No focal lesion identified. Within normal limits in parenchymal echogenicity. IVC: No abnormality visualized. Pancreas: Visualized portion  unremarkable. Spleen: Size and appearance within normal limits. Right Kidney: Length: 13.7 cm. Echogenicity within normal limits. No mass or hydronephrosis visualized. Left Kidney: Length: 13.9 cm. , not well visualized. Overall normal parenchymal echogenicity with no evidence of a mass. No hydronephrosis. Abdominal aorta: No aneurysm visualized. Other findings: None. IMPRESSION: 1. Gallbladder wall edema in a decompressed/contracted gallbladder. This is nonspecific. No convincing acute cholecystitis. No gallstones. 2. Study otherwise unremarkable. Electronically Signed   By: Amie Portland M.D.   On: 05/08/2016 16:34   Dg Chest Portable 1 View  Result Date: 05/08/2016 CLINICAL DATA:  Shortness of breath and weakness.  Dizziness. EXAM: PORTABLE CHEST 1 VIEW COMPARISON:  May 06, 2016 FINDINGS: There is no edema or consolidation. Heart is upper normal in size with pulmonary vascularity within normal limits. No adenopathy. No pneumothorax. No bone lesions. IMPRESSION: No edema or consolidation.  Stable cardiac silhouette. Electronically Signed   By: Bretta Bang III M.D.   On: 05/08/2016 12:41     STUDIES:  Abd U/S 10/8:  Gallbladder wall edema in a decompressed/contracted gallbladder. This is nonspecific. No convincing acute cholecystitis. No gallstones.  MICROBIOLOGY: Urine Ctx 10/6:  Negative MRSA PCR 10/8:  Negative  Blood Ctx x2 10/8 >> Urine Ctx 10/8 >>  ANTIBIOTICS: Vancomycin 10/8>>> Zosyn 10/8>>>  SIGNIFICANT EVENTS: 10/08 - Admit  LINES/TUBES: R Fem CVL 10/8 >> Foley 10/8 >> PIV x1  ASSESSMENT / PLAN:  CARDIOVASCULAR A:   Shock - Hypovolemia vs Sepsis. Resolved. H/O HTN  P:  Vitals per unit protocol Continuous telemetry monitoring Trending Troponin I Checking TTE  PULMONARY A: Acute Hypoxic Respiratory Failure - Multifactorial. OSA - On home CPAP.  P:   Weaning FiO2 for Sat >94% Continuous Pulse Oximetry CPAP qhs  RENAL A: Acute Renal Failure -  Worsened by Jardiance. Profound Metabolic Acidosis - Worsened by Metformin. Stable on Bicarb gtt. Lactic Acidosis - Improving slowly. Poor clearance with renal failure & acute liver injury.  P:   Trending UOP with foley Monitoring electrolytes & renal function daily Replacing electrolytes as indicated. Continuing Bicarb gtt   GASTROINTESTINAL A: Acute Liver Failure - Tylenol <10, Salicylate 10.6, & Abd U/S.   P:   NPO Trending LFTs q12hr GI following & appreciate recommendations D/C Pepcid IV  HEMATOLOGIC A: Anemia - Hgb stable. No signs of active bleeding. Coagulopathy - Likely due to acute liver failure. S/P 4u FFP & Vitamin K.  Thrombocytopenia - Stable.  Leukocytosis - Worsening.   P:  Trending cell counts daily w/ CBC Trending Coags q12hr Transfuse for active bleeding or Hgb <7.0 Checking Fibrinogen stat  INFECTIOUS A: Possible Sepsis  P:   Empiric Vancomycin & Zosyn Day #2 Awaiting culture results  Trending Procalcitonin per algorithm Checking Respiratory Panel PCR Droplet Isolation  ENDOCRINE A: H/O DM - On Metformin & Jardiance. Hypoglycemia - Resolved.      P:   Accu-Checks q4hr with MD notification parameters Hold metformin & jardiance  NEUROLOGIC A: No active issue   P:   Monitor closely   FAMILY  - Updates:  Patient, father, & sister updated 10/9 by Dr. Jamison Neighbor.  - Inter-disciplinary family meet or Palliative Care meeting:  Done 10/9 by Dr. Jamison Neighbor.   TODAY'S SUMMARY:  45 y.o. female with hx DM on metformin admitted 10/8 with profound metabolic acidosis, acute renal failure, acute liver failure.  Continuing to monitor coagulopathy. Appreciate GI input. Monitoring renal and liver function. Awaiting respiratory panel PCR.  I have spent a total of 31 minutes of critical care time today caring for the patient, updating family, and reviewing the patient's electronic medical record.   Donna Christen Jamison Neighbor, M.D. [facility] Pulmonary & Critical  Care Pager:  [phone] After 3pm or if no response, call [phone] 05/09/2016, 7:20 AM

## 2016-05-09 NOTE — Progress Notes (Signed)
CRITICAL VALUE ALERT  Critical value received:  Calcium 6.3  Date of notification:  05/09/16  Time of notification:  1500  Critical value read back:Yes.    Nurse who received alert:  Tory EmeraldMichelle Madell Heino, RN  MD notified (1st page):  Anders SimmondsPete Babcock, NP  Time of first page:  1500  MD notified (2nd page):  Time of second page:  Responding MD:  Anders SimmondsPete Babcock, NP  Time MD responded:  1500

## 2016-05-09 NOTE — Progress Notes (Signed)
CRITICAL VALUE ALERT  Critical value received:  INR 9.6  Date of notification:  05/09/16  Time of notification:  1723  Critical value read back:Yes.    Nurse who received alert:  Tory EmeraldMichelle Zenovia Justman, RN  MD notified (1st page):  Dr Delton CoombesByrum  Time of first page:  1723  MD notified (2nd page):  Time of second page:  Responding MD:  Dr Delton CoombesByrum  Time MD responded:  [phone]

## 2016-05-09 NOTE — Procedures (Signed)
OGT Placement Under Direct Laryngoscopy  OGT placed under direct laryngoscopy and was auscultated in the stomach.  X-ray pending.  Alyson ReedyWesam G. Malu Pellegrini, M.D. [facility] Pulmonary/Critical Care Medicine. Pager: [phone]. After hours pager: [phone].

## 2016-05-09 NOTE — Progress Notes (Signed)
   Se also PA note from earlier.  Acute liver failure with hepatic encephalopathy and coagulopathy. Hypoglycemia also Acute renal failure.  Cause is unknown. APAP level ok. Hepatitis serologies pending.  Needs to be transfered to a center that does liver transplants ASAP.  Discussed w/ Anders SimmondsPete Babcock, NP - he will relay to Dr. Jamison NeighborNestor.  Iva Booparl E. Zacari Radick, MD, [facility] [phone] (pager) [phone] after 5 PM, weekends and holidays  05/09/2016 2:19 PM

## 2016-05-09 NOTE — Progress Notes (Signed)
CRITICAL VALUE ALERT  Critical value received:  Lactic acid 10.1  Date of notification:  10/917  Time of notification:  0830  Critical value read back:Yes.    Nurse who received alert:  Tory EmeraldMichelle Jaquin Coy, RN  MD notified (1st page):  CCM Resident and Dr Jamison NeighborNestor  Time of first page:  [phone]  MD notified (2nd page):  Time of second page:  Responding MD:  CCM Resident  Time MD responded:  [phone]

## 2016-05-09 NOTE — Progress Notes (Signed)
Inpatient Diabetes Program Recommendations  AACE/ADA: New Consensus Statement on Inpatient Glycemic Control (2015)  Target Ranges:  Prepandial:   less than 140 mg/dL      Peak postprandial:   less than 180 mg/dL (1-2 hours)      Critically ill patients:  140 - 180 mg/dL   Lab Results  Component Value Date   GLUCAP 86 05/09/2016   HGBA1C 7.9 (H) 02/01/2016    Review of Glycemic Control Results for HAZELLE, WOOLLARD (MRN [id number]) as of 05/09/2016 09:39  Ref. Range 05/08/2016 19:58 05/09/2016 00:19 05/09/2016 03:58 05/09/2016 04:28 05/09/2016 07:25  Glucose-Capillary Latest Ref Range: 65 - 99 mg/dL 77 77 31 (LL) 952 (H) 86   Diabetes history: Type 2 diabetes Outpatient Diabetes medications: Tanzeum 50 mg once weekly,  Metformin 500 mg bid, Jardiance 25 mg daily  Current orders for Inpatient glycemic control:  None  Thanks, Beryl Meager, RN, BC-ADM Inpatient Diabetes Coordinator Pager [phone] (8a-5p)

## 2016-05-09 NOTE — Plan of Care (Signed)
  Interdisciplinary Goals of Care Family Meeting   Date carried out:: 05/09/2016  Location of the meeting: Bedside  Member's involved: Physician, Bedside Registered Nurse and Family Member or next of kin  Durable Power of Attorney or acting medical decision maker: Patient making decisions.  Discussion: We discussed goals of care for Julia Estrada, her father, & her sister. Continuing supportive care with multi-system organ failure.  Code status: Full Code  Disposition: Continue current acute care  Time spent for the meeting: 9 minutes  Julia Estrada 05/09/2016, 9:41 AM

## 2016-05-09 NOTE — Progress Notes (Signed)
CRITICAL VALUE ALERT  Critical value received:  INR 4.64, lactic acid 8.5  Date of notification:  05/09/2016  Time of notification:  2125  Critical value read back:Yes.    Nurse who received alert:  Lillia CorporalHancock, Sharol Croghan Elizabeth  MD notified (1st page):  Celine Mansesai PA  Time of first page:  2125  MD notified (2nd page):  Time of second page:  Responding MD:  Celine Mansesai PA  Time MD responded:  2125

## 2016-05-09 NOTE — Progress Notes (Signed)
Daily Rounding Note      05/09/2016, 8:28 AM  LOS: 1 day   ASSESMENT:    ACUTE LIVER FAILURE  *  Shock liver with synthetic dysfunction.  ? Element of drug induced liver injury? Though she says all meds she takes have been taken long term, but ? The chronic vs acute/recent use of Ibuprofen.   Ultrasound appearance of liver normal.  GB contracted with non-specific wall edema.  Ammonia elevated. APAP level <10.    *  Coagulopathy.  S/p FFP x 4, Vitamin K 10 mg IV.    *  Thrombocytopenia.  Acute.   *  Microcytic anemia.   This is chronic.    *  OSA, on CPAP  *  Type 2 DM on multiple meds but not insulin.     PLAN   *  Supportive care.  Will get acute hepatitis panel and EBV virus with next lab draw.      Jennye Moccasin  05/09/2016, 8:28 AM Pager: [phone]     [facility] Attending   I have taken an interval history, reviewed the chart and examined the patient. I agree with the Advanced Practitioner's note, impression and recommendations.     SHE HAS ACUTE LIVER FAILURE OF UNCLEAR ETIOLOGY THAT IS LIFE-THREATENING - HYPOGLYCEMIA, COAGULOPATHY AND ENCEPHALOPATHY.  NEEDS TRANSFER TO TERIARY CENTER THAT DOES LIVER TRANSPLANT  Discusses w/ Anders Simmonds, NP Other note above this one in timeline also  Iva Boop, MD, [facility] [phone] (pager) [phone] after 5 PM, weekends and holidays  05/09/2016 2:22 PM       SUBJECTIVE:   Chief complaint: short of breath     Denies n/v.  Some abd pain, mild to moderate, on left.   Says no new changes in meds in recent weeks.   Loose stool  With kayexelate overnight.   OBJECTIVE:         Vital signs in last 24 hours:    Temp:  [96.4 F (35.8 C)-98.5 F (36.9 C)] 97.5 F (36.4 C) (10/09 0727) Pulse Rate:  [89-108] 100 (10/09 0800) Resp:  [10-48] 30 (10/09 0800) BP: (58-137)/(30-117) 108/64 (10/09 0800) SpO2:  [95 %-100 %] 98 %  (10/09 0800) Weight:  [122.5 kg (270 lb)-128.6 kg (283 lb 8.2 oz)] 128.6 kg (283 lb 8.2 oz) (10/09 0406) Last BM Date: 05/09/16 Filed Weights   05/08/16 1116 05/08/16 1648 05/09/16 0406  Weight: 122.5 kg (270 lb) 126.7 kg (279 lb 5.2 oz) 128.6 kg (283 lb 8.2 oz)   General: obese.  CPAP device in place critically ill  Heart: Reg, slightly tachy.  Chest: breathing non labored on CPAP.  Clear in front Abdomen: obese, liver edge palpable a few cm below costal margin, slight tenderness RUQ and left abdomen.  No guard or rebound.  Sounds hypoactive.  Extremities: no LE edema.  Feet warm Neuro/Psych:  Alert, oriented x 3.  Moves all 4s, strength not tested.  No tremor. Speaking slowly   Intake/Output from previous day: 10/08 0701 - 10/09 0700 In: 5589.2 [I.V.:1999.2; Blood:1075; IV Piggyback:2370] Out: 83 [Urine:83]  Intake/Output this shift: Total I/O In: 160 [I.V.:150; Other:10] Out: 5 [Urine:5]  Lab Results:  Recent Labs  05/08/16 1410 05/08/16 1620 05/09/16 0400  WBC 11.7* 11.2* 15.5*  HGB 8.8* 8.2* 8.9*  HCT 30.1* 28.1* 29.0*  PLT 58* 50* 41*   BMET  Recent Labs  05/08/16 1819 05/09/16 0021 05/09/16 0400  NA 133*  135 137  K 5.8* 4.3 4.2  CL 101 99* 98*  CO2 7* 15* 16*  GLUCOSE 87 81 38*  BUN 34* 34* 31*  CREATININE 4.68* 4.90* 5.03*  CALCIUM 6.9* 7.1* 7.0*   LFT  Recent Labs  05/06/16 1219 05/08/16 1131 05/08/16 1819 05/09/16 0400  PROT 7.6 7.0 5.4* 5.8*  ALBUMIN 3.8 3.3* 2.5* 2.8*  AST 781* >10,000* >10,000* >10,000*  ALT 925* 10,671* 8,457* 7,110*  ALKPHOS 123* 271* 229* 222*  BILITOT 1.0 4.8* 4.6* 4.8*  BILIDIR 0.2  --   --   --    PT/INR  Recent Labs  05/08/16 1620 05/09/16 0400  LABPROT 62.1* 52.5*  INR 6.92* 5.61*    Studies/Results: [place] Abdomen Port  Result Date: 05/08/2016 CLINICAL DATA:  Liver and renal failure. EXAM: ABDOMEN ULTRASOUND COMPLETE COMPARISON:  CT, 06/30/2014 FINDINGS: Gallbladder: Gallbladder is contracted. No  gallstones. Wall appears edematous which is nonspecific in this contracted gallbladder. Common bile duct: Diameter: 3 mm Liver: No focal lesion identified. Within normal limits in parenchymal echogenicity. IVC: No abnormality visualized. Pancreas: Visualized portion unremarkable. Spleen: Size and appearance within normal limits. Right Kidney: Length: 13.7 cm. Echogenicity within normal limits. No mass or hydronephrosis visualized. Left Kidney: Length: 13.9 cm. , not well visualized. Overall normal parenchymal echogenicity with no evidence of a mass. No hydronephrosis. Abdominal aorta: No aneurysm visualized. Other findings: None. IMPRESSION: 1. Gallbladder wall edema in a decompressed/contracted gallbladder. This is nonspecific. No convincing acute cholecystitis. No gallstones. 2. Study otherwise unremarkable. Electronically Signed   By: Amie Portlandavid  Ormond M.D.   On: 05/08/2016 16:34   Dg Chest Portable 1 View  Result Date: 05/08/2016 CLINICAL DATA:  Shortness of breath and weakness.  Dizziness. EXAM: PORTABLE CHEST 1 VIEW COMPARISON:  May 06, 2016 FINDINGS: There is no edema or consolidation. Heart is upper normal in size with pulmonary vascularity within normal limits. No adenopathy. No pneumothorax. No bone lesions. IMPRESSION: No edema or consolidation.  Stable cardiac silhouette. Electronically Signed   By: Bretta BangWilliam  Woodruff III M.D.   On: 05/08/2016 12:41

## 2016-05-09 NOTE — Progress Notes (Signed)
ABG results given to Anders SimmondsPete Babcock, NP.

## 2016-05-09 NOTE — Progress Notes (Signed)
CRITICAL VALUE ALERT  Critical value received:  INR-5.61  Date of notification:  05/09/2016  Time of notification:  4:48 AM  Critical value read back:Yes.    Nurse who received alert:  Terrilyn SaverHopper, Wilmarie Sparlin Anderson  MD notified (1st page):  Dr. Vassie LollAlva  Time of first page:  4:48 AM  MD notified (2nd page):  Time of second page:  Responding MD:  Dr. Vassie LollAlva  Time MD responded:  4:48 AM

## 2016-05-09 NOTE — Progress Notes (Signed)
eLink Physician-Brief Progress Note Patient Name: Julia B BShauna Estrada DOB: 04/22/1971 MRN: [id number]   Date of Service  05/09/2016  HPI/Events of Note  Hypoglycemic High INR Mild confusion   eICU Interventions  Change bicarb to d5w Rpt vit K - no e/o bleeding chk ammonia      Intervention Category Intermediate Interventions: Diagnostic test evaluation  ALVA,RAKESH V. 05/09/2016, 4:47 AM

## 2016-05-09 NOTE — Progress Notes (Signed)
Pt becoming increasing more confused as the day has progressed. Pt alert to self and place only now. Pt has tried multiple times to get out bed despite call [place] being in reach. Bed alarm is and has been on.   Dr Jamison NeighborNestor made aware. Will continue to monitor.

## 2016-05-09 NOTE — Discharge Summary (Signed)
Physician Discharge Summary       Patient ID: Julia Estrada MRN: [id number] DOB/AGE: [birth year] 45 y.o.  Admit date: 05/08/2016 Discharge date: 05/09/2016  Discharge Diagnoses:   Shock - Hypovolemia vs Sepsis. Resolved. H/O HTN Acute Hypoxic Respiratory Failure - Multifactorial. OSA - On home CPAP. Acute Renal Failure - Worsened by Jardiance. Profound Metabolic Acidosis - Worsened by Metformin. Stable on Bicarb gtt. Lactic Acidosis - Improving slowly. Poor clearance with renal failure & acute liver injury. Acute Liver Failure w/ hepatic encephalopathy and coagulopathy -->Etiology unclear  Anemia - Hgb stable. No signs of active bleeding. Coagulopathy - Likely due to acute liver failure. S/P 4u FFP & Vitamin K.  Thrombocytopenia - Stable.  Leukocytosis - Worsening.  Possible Sepsis H/O DM - On Metformin & Jardiance. Hypoglycemia - Resolved.  Acute encephalopathy/acute hepatic encephalopathy   Detailed Hospital Course:    45yo female with hx DM on metformin, HTN, OSA presented 10/8 with 3 day hx vague c/o dizziness, fever, headache, urinary frequency, weakness which she sought care for at [facility] 10/6.  At that time she had normal renal function, normal blood sugar and was sent home with presumed viral illness.  She did have abnormal LFT's at that time.  She returned 10/8 with c/o same with the addition of dyspnea.  In ER she had metabolic disarray with Scr 5.4, anion gap 30, lactate >17, pH 7.040, profound hypotension with SBP 60-70, severely abnormal LFT's (AST and ALT >10,000, bili 4.8) and hypoglycemia with CBG 40. ER placed femoral CVL (pt unable to lie flat), started levophed and admitted to the critical care team.   Since admission to the intensive care she has been volume resuscitated, weaned off from levophed gtt, and continued on empiric antibiotics. In addition to GI consultation   the following diagnostics have been sent/resulted: Ammonia levels: 131, rising to  135 Mild troponin elevation 0.6 to 0.7 Lactic acid 10.1 to 9.6 Epstein-barr virus sent and pending.  Acute hepatitis panel sent and pending Respiratory viral panel: negative Salicylate level: 10.6 acetaminophen level <10 Urine strep antigen: pending Urine drug screen negative.  Urine and blood cultures sent and negative.  Ultrasound of abd: 1. Gallbladder wall edema in a decompressed/contracted gallbladder. This is nonspecific. No convincing acute cholecystitis. No Gallstones. 2. Study otherwise unremarkable   At time of dictation she remains coagulopathic w/ INR 5.61, her AST remains >10,000, ande ALT 7110. Further more her creatinine is continuing to climb: now at 5.03. To date with rising LFTs, worsening renal fxn, persistent acidosis it and acute liver failure w/on-going encephalopathy and coagulopathy it was felt her care would be best carried out at a tertiary care facility where she could under go transplant if necessary.     Discharge Plan by active problems   GASTROINTESTINAL A: Acute Liver Failure w/ hepatic encephalopathy and coagulopathy -->Etiology unclear MELD score: 40  Plan:   NPO Trending LFTs q12hr GI feels best option is transfer to tertiary care w/ hepatology and transplant services   RENAL A: Acute Renal Failure - Worsened by Jardiance. Profound Metabolic Acidosis - Worsened by Metformin. Stable on Bicarb gtt. Lactic Acidosis - Improving slowly. Poor clearance with renal failure & acute liver injury. Plan:   Trending UOP with foley Monitoring electrolytes & renal function daily Replacing electrolytes as indicated. Continuing Bicarb gtt   HEMATOLOGIC A: Anemia - Hgb stable. No signs of active bleeding. Coagulopathy - Likely due to acute liver failure. S/P 4u FFP & Vitamin K.  Thrombocytopenia -  Stable.  Leukocytosis - Worsening.  Plan:  Trending cell counts daily w/ CBC Trending Coags q12hr Transfuse for active bleeding or Hgb <7.0 Sending  LDH  NEUROLOGIC A: Acute encephalopathy/acute hepatic encephalopathy  Plan:   Lactulose  Supportive care  CARDIOVASCULAR A:   Shock - Hypovolemia vs Sepsis. Resolved. H/O HTN Plan:  Vitals per unit protocol Continuous telemetry monitoring Trending Troponin I Checking TTE  PULMONARY A: Acute Hypoxic Respiratory Failure - Multifactorial. OSA - On home CPAP Plan:   Weaning FiO2 for Sat >94% Continuous Pulse Oximetry CPAP qhs  INFECTIOUS A: Possible Sepsis Plan:   Empiric Vancomycin & Zosyn Day #2 Awaiting culture results  Trending Procalcitonin per algorithm Droplet Isolation  ENDOCRINE A: H/O DM - On Metformin & Jardiance. Hypoglycemia - Resolved.   Plan:   Accu-Checks q4hr with MD notification parameters Hold metformin & jardiance   Consults: Gastroenterology    Abd U/S 10/8:  Gallbladder wall edema in a decompressed/contracted gallbladder. This is nonspecific. No convincing acute cholecystitis. No gallstones.  MICROBIOLOGY: Urine Ctx 10/6:  Negative MRSA PCR 10/8:  Negative  Blood Ctx x2 10/8 >> Urine Ctx 10/8 >>  ANTIBIOTICS: Vancomycin 10/8>>> Zosyn 10/8>>>  Discharge Exam:  BP (!) 147/74   Pulse 96   Temp 97.5 F (36.4 C) (Oral)   Resp (!) 23   Ht 5\' 6"  (1.676 m)   Wt 283 lb 8.2 oz (128.6 kg)   LMP 04/20/2016 (Approximate)   SpO2 96%   BMI 45.76 kg/m  General:  Obese female. No distress. Awake. Family at bedside.  Neuro:  Oriented x1. No meningismus. Moving all 4 extremities equally. Agitated  HEENT:  Tacky mucus membranes. No scleral injection. Pupils symmetric. Cardiovascular:  Regular rate & rhythm. No edema. Lungs:  Mildly decreased breath sounds in bases. Normal work of breathing on CPAP. Abdomen:  Obese. Soft. Mildly tender to palp without guarding.  Musculoskeletal:  Warm and dry. No rash on exposes skin  Labs at discharge CBC Recent Labs     05/08/16  1410  05/08/16  1620  05/09/16  0400  WBC  11.7*  11.2*   15.5*  HGB  8.8*  8.2*  8.9*  HCT  30.1*  28.1*  29.0*  PLT  58*  50*  41*    Coag's Recent Labs     05/08/16  1620  05/09/16  0400  INR  6.92*  5.61*    BMET Recent Labs     05/09/16  0021  05/09/16  0400  05/09/16  1415  NA  135  137  136  K  4.3  4.2  3.9  CL  99*  98*  94*  CO2  15*  16*  20*  BUN  34*  31*  31*  CREATININE  4.90*  5.03*  5.70*  GLUCOSE  81  38*  146*    Electrolytes Recent Labs     05/09/16  0021  05/09/16  0400  05/09/16  1415  CALCIUM  7.1*  7.0*  6.3*    Sepsis Markers Recent Labs     05/09/16  0920  PROCALCITON  2.17    ABG Recent Labs     05/08/16  1736  05/08/16  2118  05/09/16  1400  PHART  7.135*  7.272*  7.384  PCO2ART  17.2*  22.0*  28.2*  PO2ART  121.0*  103.0  78.7*    Liver Enzymes Recent Labs     05/08/16  1131  05/08/16  1819  05/09/16  0400  AST  >10,000*  >10,000*  >10,000*  ALT  10,671*  8,457*  7,110*  ALKPHOS  271*  229*  222*  BILITOT  4.8*  4.6*  4.8*  ALBUMIN  3.3*  2.5*  2.8*    Cardiac Enzymes Recent Labs     05/08/16  1410  05/09/16  0727  05/09/16  1245  TROPONINI  0.14*  0.60*  0.70*    Glucose Recent Labs     05/09/16  0019  05/09/16  0358  05/09/16  0428  05/09/16  0725  05/09/16  1106  05/09/16  1325  GLUCAP  77  31*  126*  86  71  95    Imaging [place] Abdomen Port  Result Date: 05/08/2016 CLINICAL DATA:  Liver and renal failure. EXAM: ABDOMEN ULTRASOUND COMPLETE COMPARISON:  CT, 06/30/2014 FINDINGS: Gallbladder: Gallbladder is contracted. No gallstones. Wall appears edematous which is nonspecific in this contracted gallbladder. Common bile duct: Diameter: 3 mm Liver: No focal lesion identified. Within normal limits in parenchymal echogenicity. IVC: No abnormality visualized. Pancreas: Visualized portion unremarkable. Spleen: Size and appearance within normal limits. Right Kidney: Length: 13.7 cm. Echogenicity within normal limits. No mass or hydronephrosis visualized. Left  Kidney: Length: 13.9 cm. , not well visualized. Overall normal parenchymal echogenicity with no evidence of a mass. No hydronephrosis. Abdominal aorta: No aneurysm visualized. Other findings: None. IMPRESSION: 1. Gallbladder wall edema in a decompressed/contracted gallbladder. This is nonspecific. No convincing acute cholecystitis. No gallstones. 2. Study otherwise unremarkable. Electronically Signed   By: Amie Portlandavid  Ormond M.D.   On: 05/08/2016 16:34   Dg Chest Portable 1 View  Result Date: 05/08/2016 CLINICAL DATA:  Shortness of breath and weakness.  Dizziness. EXAM: PORTABLE CHEST 1 VIEW COMPARISON:  May 06, 2016 FINDINGS: There is no edema or consolidation. Heart is upper normal in size with pulmonary vascularity within normal limits. No adenopathy. No pneumothorax. No bone lesions. IMPRESSION: No edema or consolidation.  Stable cardiac silhouette. Electronically Signed   By: Bretta BangWilliam  Woodruff III M.D.   On: 05/08/2016 12:41     Disposition:  outside tertiary care     Medication List    STOP taking these medications   cholecalciferol 1000 units tablet Commonly known as:  VITAMIN D   diphenhydrAMINE 25 MG tablet Commonly known as:  BENADRYL   esomeprazole 40 MG capsule Commonly known as:  NEXIUM   fluconazole 150 MG tablet Commonly known as:  DIFLUCAN   glucose blood test strip Commonly known as:  ONETOUCH VERIO   ibuprofen 200 MG tablet Commonly known as:  ADVIL,MOTRIN   JARDIANCE 25 MG Tabs tablet Generic drug:  empagliflozin   levofloxacin 500 MG tablet Commonly known as:  LEVAQUIN   loratadine 10 MG tablet Commonly known as:  CLARITIN   meclizine 12.5 MG tablet Commonly known as:  ANTIVERT   metFORMIN 500 MG 24 hr tablet Commonly known as:  GLUCOPHAGE-XR   montelukast 10 MG tablet Commonly known as:  SINGULAIR   norethindrone-ethinyl estradiol-iron 1-20/1-30/1-35 MG-MCG tablet Commonly known as:  ESTROSTEP FE,TILIA FE,TRI-LEGEST FE   olmesartan 40 MG  tablet Commonly known as:  BENICAR   ONETOUCH DELICA LANCETS FINE Misc   pantoprazole 40 MG tablet Commonly known as:  PROTONIX   ranitidine 300 MG tablet Commonly known as:  ZANTAC   simvastatin 20 MG tablet Commonly known as:  ZOCOR   TANZEUM 50 MG Pen Generic drug:  Albiglutide   triamcinolone cream 0.5 % Commonly known as:  KENALOG     TAKE these medications   albuterol 108 (90 Base) MCG/ACT inhaler Commonly known as:  VENTOLIN HFA Inhale 2 puffs into the lungs every 6 (six) hours as needed for wheezing or shortness of breath.   lactulose 10 GM/15ML solution Commonly known as:  CHRONULAC Take 45 mLs (30 g total) by mouth 3 (three) times daily.   norepinephrine 4 mg in dextrose 5 % 250 mL Inject 2-50 mcg/min into the vein continuous.   piperacillin-tazobactam 2-0.25 GM/50ML IVPB Commonly known as:  ZOSYN Inject 50 mLs (2.25 g total) into the vein every 8 (eight) hours.   sodium bicarbonate 150 mEq in dextrose 5 % 850 mL Inject 150 mEq into the vein continuous.   sodium chloride 0.9 % infusion kvo        Discharged Condition: critical   Signed: Shelby Mattocks 05/09/2016, 3:06 PM   Physician Statement:   The Patient was personally examined, the discharge assessment and plan has been personally reviewed and I agree with ACNP Babcock's assessment and plan. > 30 minutes of time have been dedicated to discharge assessment, planning and discharge instructions.   Donna Christen Jamison Neighbor, M.D. [facility] Pager:  [phone] After 3pm or if no response, call [phone] 7:30 AM 05/10/16

## 2016-05-09 NOTE — Progress Notes (Signed)
Patient transported to CT and back to room 2M03 without any apparent complications. RT will continue to monitor.

## 2016-05-09 NOTE — Progress Notes (Signed)
Pharmacy Antibiotic Note  45 YOF presented on 05/06/16 and then 05/08/16 with complaints of increased dizziness and weakness.  Pharmacy consulted to initiate vancomycin and Zosyn for sepsis.  First doses of antibiotics are already ordered.  AKI continues to worsen. UOP negligible. WBC up at 15.5. Hypothermic.  Last dose of vancomycin was 10/8 at 12:58PM - Vancomycin loading dose of 2g.   Plan: Will hold further vancomycin and plan to dose based on levels.  Would check vancomycin random level at 48 hours (10/10 AM).  Continue Zosyn 2.25gm IV Q8H for now Monitor renal fxn and clinical progress  Height:  (167.6 cm) Weight: 283 lb 8.2 oz (128.6 kg) IBW/kg (Calculated) : 59.3  Temp (24hrs), Avg:97.7 F (36.5 C), Min:97.3 F (36.3 C), Max:98.5 F (36.9 C)   Recent Labs Lab 05/06/16 1219 05/06/16 1509 05/08/16 1131  05/08/16 1301 05/08/16 1410 05/08/16 1421 05/08/16 1620 05/08/16 1626 05/08/16 1819 05/08/16 1820 05/09/16 0021 05/09/16 0400 05/09/16 0727 05/09/16 1245  WBC 7.3 8.9  --   --   --  11.7*  --  11.2*  --   --   --   --  15.5*  --   --   CREATININE 0.82 0.83 4.73*  --  5.40*  --   --   --   --  4.68*  --  4.90* 5.03*  --   --   LATICACIDVEN  --   --   --   < >  --   --  16.05*  --  13.49*  --  12.4*  --   --  10.1* 9.6*  < > = values in this interval not displayed.  Estimated Creatinine Clearance: 19.4 mL/min (by C-G formula based on SCr of 5.03 mg/dL (H)).    Allergies  Allergen Reactions  . Codeine Sulfate Nausea And Vomiting  . Hydrochlorothiazide W-Triamterene     REACTION: cramping  . Oxycodone-Acetaminophen Nausea And Vomiting  . Prednisone Hives  . Spironolactone     Cramps   . Victoza [Liraglutide]     n/v    Antimicrobials this admission:  Vanc 10/8 >> Zosyn 10/8 >>  Dose adjustments this admission:  N/A  Microbiology results:  10/8 UCx -  10/8 BCx -  Link Snuffer, PharmD, BCPS Clinical Pharmacist [phone] 05/09/2016, 2:44  PM

## 2016-05-10 LAB — HEPATITIS PANEL, ACUTE
HEP B S AG: POSITIVE — AB
Hep A IgM: NEGATIVE
Hep B C IgM: POSITIVE — AB

## 2016-05-10 LAB — URINE DRUGS OF ABUSE SCREEN W ALC, ROUTINE (REF LAB)
AMPHETAMINES, URINE: NEGATIVE ng/mL
BARBITURATE, UR: NEGATIVE ng/mL
Benzodiazepine Quant, Ur: NEGATIVE ng/mL
Cannabinoid Quant, Ur: NEGATIVE ng/mL
Cocaine (Metab.): NEGATIVE ng/mL
ETHANOL U, QUAN: NEGATIVE %
METHADONE SCREEN, URINE: NEGATIVE ng/mL
Opiate Quant, Ur: NEGATIVE ng/mL
PROPOXYPHENE, URINE: NEGATIVE ng/mL
Phencyclidine, Ur: NEGATIVE ng/mL

## 2016-05-10 LAB — PREPARE FRESH FROZEN PLASMA
UNIT DIVISION: 0
UNIT DIVISION: 0
Unit division: 0

## 2016-05-10 LAB — EPSTEIN-BARR VIRUS VCA, IGM: EBV VCA IgM: <36 U/mL (ref 0.0–35.9)

## 2016-05-10 NOTE — Progress Notes (Signed)
[facility]. Called sister Enrique SackKendra and made her aware of transfer and room number. VS WNL. [facility] here for transport and given report on pt.

## 2016-05-12 ENCOUNTER — Other Ambulatory Visit: Payer: 59

## 2016-05-13 LAB — CULTURE, BLOOD (ROUTINE X 2)
Culture: NO GROWTH
Culture: NO GROWTH

## 2016-05-17 ENCOUNTER — Ambulatory Visit: Payer: 59 | Admitting: Endocrinology

## 2016-05-17 NOTE — Telephone Encounter (Signed)
Called and left a vm for patient to call me back

## 2016-05-24 NOTE — Telephone Encounter (Signed)
Called and lrft voice mail with instructions- asked for a call back if she has any questions

## 2016-06-01 DEATH — deceased

## 2016-06-12 ENCOUNTER — Other Ambulatory Visit: Payer: Self-pay | Admitting: Endocrinology

## 2016-06-12 ENCOUNTER — Other Ambulatory Visit: Payer: Self-pay | Admitting: Internal Medicine

## 2016-06-24 ENCOUNTER — Other Ambulatory Visit: Payer: Self-pay | Admitting: Internal Medicine

## 2016-07-17 ENCOUNTER — Other Ambulatory Visit: Payer: Self-pay | Admitting: Internal Medicine
